# Patient Record
Sex: Female | Born: 1965 | Race: Black or African American | Hispanic: No | Marital: Married | Smoking: Never smoker
Health system: Southern US, Community
[De-identification: ages and names within clinical notes are randomized; demographics above are authoritative.]

## PROBLEM LIST (undated history)

## (undated) DIAGNOSIS — J45909 Unspecified asthma, uncomplicated: Secondary | ICD-10-CM

## (undated) DIAGNOSIS — F419 Anxiety disorder, unspecified: Secondary | ICD-10-CM

## (undated) DIAGNOSIS — I509 Heart failure, unspecified: Secondary | ICD-10-CM

## (undated) DIAGNOSIS — N83209 Unspecified ovarian cyst, unspecified side: Secondary | ICD-10-CM

## (undated) DIAGNOSIS — I10 Essential (primary) hypertension: Secondary | ICD-10-CM

## (undated) DIAGNOSIS — M797 Fibromyalgia: Secondary | ICD-10-CM

## (undated) DIAGNOSIS — G629 Polyneuropathy, unspecified: Secondary | ICD-10-CM

## (undated) DIAGNOSIS — M069 Rheumatoid arthritis, unspecified: Secondary | ICD-10-CM

## (undated) DIAGNOSIS — K760 Fatty (change of) liver, not elsewhere classified: Secondary | ICD-10-CM

## (undated) HISTORY — PX: TUBAL LIGATION: SHX77

---

## 2008-02-05 ENCOUNTER — Emergency Department (HOSPITAL_BASED_OUTPATIENT_CLINIC_OR_DEPARTMENT_OTHER): Admission: EM | Admit: 2008-02-05 | Discharge: 2008-02-05 | Payer: Self-pay | Admitting: Emergency Medicine

## 2008-02-10 ENCOUNTER — Emergency Department (HOSPITAL_BASED_OUTPATIENT_CLINIC_OR_DEPARTMENT_OTHER): Admission: EM | Admit: 2008-02-10 | Discharge: 2008-02-10 | Payer: Self-pay | Admitting: Emergency Medicine

## 2009-12-20 ENCOUNTER — Emergency Department (HOSPITAL_BASED_OUTPATIENT_CLINIC_OR_DEPARTMENT_OTHER): Admission: EM | Admit: 2009-12-20 | Discharge: 2009-12-20 | Payer: Self-pay | Admitting: Emergency Medicine

## 2009-12-23 ENCOUNTER — Emergency Department (HOSPITAL_BASED_OUTPATIENT_CLINIC_OR_DEPARTMENT_OTHER): Admission: EM | Admit: 2009-12-23 | Discharge: 2009-12-23 | Payer: Self-pay | Admitting: Emergency Medicine

## 2010-04-26 ENCOUNTER — Emergency Department (HOSPITAL_BASED_OUTPATIENT_CLINIC_OR_DEPARTMENT_OTHER)
Admission: EM | Admit: 2010-04-26 | Discharge: 2010-04-26 | Disposition: A | Discharge status: Home / Self Care | Payer: 59 | Attending: Emergency Medicine | Admitting: Emergency Medicine

## 2010-04-26 DIAGNOSIS — R112 Nausea with vomiting, unspecified: Secondary | ICD-10-CM | POA: Insufficient documentation

## 2010-04-26 DIAGNOSIS — R197 Diarrhea, unspecified: Secondary | ICD-10-CM | POA: Insufficient documentation

## 2010-04-26 LAB — URINALYSIS, ROUTINE W REFLEX MICROSCOPIC
Hgb urine dipstick: NEGATIVE
Nitrite: NEGATIVE
Protein, ur: 30 mg/dL — AB
Specific Gravity, Urine: 1.03 (ref 1.005–1.030)
Urobilinogen, UA: 1 mg/dL (ref 0.0–1.0)

## 2010-04-26 LAB — URINE MICROSCOPIC-ADD ON

## 2010-04-26 LAB — PREGNANCY, URINE: Preg Test, Ur: NEGATIVE

## 2010-12-21 ENCOUNTER — Ambulatory Visit (HOSPITAL_COMMUNITY): Payer: 59 | Admitting: Licensed Clinical Social Worker

## 2011-03-10 ENCOUNTER — Encounter (HOSPITAL_BASED_OUTPATIENT_CLINIC_OR_DEPARTMENT_OTHER): Payer: Self-pay | Admitting: *Deleted

## 2011-03-10 ENCOUNTER — Other Ambulatory Visit: Payer: Self-pay

## 2011-03-10 ENCOUNTER — Emergency Department (INDEPENDENT_AMBULATORY_CARE_PROVIDER_SITE_OTHER): Payer: 59

## 2011-03-10 ENCOUNTER — Encounter (HOSPITAL_BASED_OUTPATIENT_CLINIC_OR_DEPARTMENT_OTHER): Payer: Self-pay | Admitting: Emergency Medicine

## 2011-03-10 ENCOUNTER — Emergency Department (HOSPITAL_BASED_OUTPATIENT_CLINIC_OR_DEPARTMENT_OTHER)
Admission: EM | Admit: 2011-03-10 | Discharge: 2011-03-10 | Disposition: A | Discharge status: Home / Self Care | Payer: 59 | Attending: Emergency Medicine | Admitting: Emergency Medicine

## 2011-03-10 ENCOUNTER — Emergency Department (HOSPITAL_BASED_OUTPATIENT_CLINIC_OR_DEPARTMENT_OTHER)
Admission: EM | Admit: 2011-03-10 | Discharge: 2011-03-10 | Discharge status: Against Medical Advice | Payer: 59 | Attending: Emergency Medicine | Admitting: Emergency Medicine

## 2011-03-10 DIAGNOSIS — R0602 Shortness of breath: Secondary | ICD-10-CM

## 2011-03-10 DIAGNOSIS — F419 Anxiety disorder, unspecified: Secondary | ICD-10-CM

## 2011-03-10 DIAGNOSIS — F411 Generalized anxiety disorder: Secondary | ICD-10-CM | POA: Insufficient documentation

## 2011-03-10 DIAGNOSIS — Z79899 Other long term (current) drug therapy: Secondary | ICD-10-CM | POA: Insufficient documentation

## 2011-03-10 DIAGNOSIS — I10 Essential (primary) hypertension: Secondary | ICD-10-CM | POA: Insufficient documentation

## 2011-03-10 DIAGNOSIS — R109 Unspecified abdominal pain: Secondary | ICD-10-CM | POA: Insufficient documentation

## 2011-03-10 DIAGNOSIS — N390 Urinary tract infection, site not specified: Secondary | ICD-10-CM | POA: Insufficient documentation

## 2011-03-10 DIAGNOSIS — A599 Trichomoniasis, unspecified: Secondary | ICD-10-CM | POA: Insufficient documentation

## 2011-03-10 DIAGNOSIS — R079 Chest pain, unspecified: Secondary | ICD-10-CM | POA: Insufficient documentation

## 2011-03-10 HISTORY — DX: Essential (primary) hypertension: I10

## 2011-03-10 HISTORY — DX: Anxiety disorder, unspecified: F41.9

## 2011-03-10 HISTORY — DX: Unspecified ovarian cyst, unspecified side: N83.209

## 2011-03-10 LAB — URINALYSIS, ROUTINE W REFLEX MICROSCOPIC
Bilirubin Urine: NEGATIVE
Glucose, UA: NEGATIVE mg/dL
Glucose, UA: NEGATIVE mg/dL
Hgb urine dipstick: NEGATIVE
Ketones, ur: NEGATIVE mg/dL
Nitrite: NEGATIVE
Protein, ur: NEGATIVE mg/dL
Specific Gravity, Urine: 1.028 (ref 1.005–1.030)
Urobilinogen, UA: 1 mg/dL (ref 0.0–1.0)
pH: 6 (ref 5.0–8.0)

## 2011-03-10 LAB — CARDIAC PANEL(CRET KIN+CKTOT+MB+TROPI)
CK, MB: 1.2 ng/mL (ref 0.3–4.0)
Relative Index: INVALID (ref 0.0–2.5)
Relative Index: INVALID (ref 0.0–2.5)
Total CK: 47 U/L (ref 7–177)
Troponin I: <0.3 ng/mL (ref <0.30)

## 2011-03-10 LAB — CBC
HCT: 37.5 % (ref 36.0–46.0)
Hemoglobin: 13.2 g/dL (ref 12.0–15.0)
MCH: 30.6 pg (ref 26.0–34.0)
MCHC: 35.2 g/dL (ref 30.0–36.0)
RDW: 12.2 % (ref 11.5–15.5)

## 2011-03-10 LAB — URINE MICROSCOPIC-ADD ON

## 2011-03-10 LAB — URINE CULTURE

## 2011-03-10 MED ORDER — LORAZEPAM 2 MG/ML IJ SOLN
1.0000 mg | Freq: Once | INTRAMUSCULAR | Status: AC
  Administered 2011-03-10: 1 mg via INTRAVENOUS
  Filled 2011-03-10: qty 1

## 2011-03-10 MED ORDER — METRONIDAZOLE 500 MG PO TABS: 500.0000 mg | ORAL_TABLET | Freq: Two times a day (BID) | ORAL | Status: AC

## 2011-03-10 MED ORDER — CEPHALEXIN 500 MG PO CAPS: 500.0000 mg | ORAL_CAPSULE | Freq: Four times a day (QID) | ORAL | Status: AC

## 2011-03-10 NOTE — ED Provider Notes (Signed)
History     CSN: [id number]  Arrival date & time 03/10/11  4098   First MD Initiated Contact with Patient 03/10/11 1038      Chief Complaint  Patient presents with  . Shortness of Breath    (Consider location/radiation/quality/duration/timing/severity/associated sxs/prior treatment) HPI Patient presents this morning with complaint of pain in her right chest and feeling that she is unable to breathe. She states that she woke up this morning felt the pain in her chest and began to feel very jittery and anxious feeling as though she could not breathe. She used a neighbors albuterol inhaler which helped with her breathing but made her feel more anxious. She was here to be seen last night in the emergency department but left prior to being evaluated she states at that time she had one episode of vomiting and some right upper abdominal pain. She states the abdominal pain and vomiting completely resolved and a new problem today is chest pain anxiety and shortness of breath. She has no leg swelling. She's had no cough. She's had no fever or chills. Pain is not related to exertion there is no nausea there is no radiation. Pain is worse with palpation. There no other alleviating or modifying factors. There no other associated systemic symptoms.  Past Medical History  Diagnosis Date  . Hypertension   . Anxiety   . Ovarian cyst     Past Surgical History  Procedure Date  . Tubal ligation     History reviewed. No pertinent family history.  History  Substance Use Topics  . Smoking status: Never Smoker   . Smokeless tobacco: Not on file  . Alcohol Use: No    OB History    Grav Para Term Preterm Abortions TAB SAB Ect Mult Living                  Review of Systems ROS reviewed and otherwise negative except for mentioned in HPI  Allergies  Oxycodone base  Home Medications   Current Outpatient Rx  Name Route Sig Dispense Refill  . DIPHENHYDRAMINE HCL 25 MG PO CAPS Oral Take 25 mg  by mouth every 6 (six) hours as needed.    [name] LORATADINE 10 MG PO TABS Oral Take 10 mg by mouth daily.    . CEPHALEXIN 500 MG PO CAPS Oral Take 1 capsule (500 mg total) by mouth 4 (four) times daily. 20 capsule 0  . METRONIDAZOLE 500 MG PO TABS Oral Take 1 tablet (500 mg total) by mouth 2 (two) times daily. 14 tablet 0    BP 120/69  Pulse 62  Temp(Src) 98.9 F (37.2 C) (Oral)  Resp 14  Ht 5\' 6"  (1.676 m)  Wt 211 lb (95.709 kg)  BMI 34.06 kg/m2  SpO2 100%  LMP 02/20/2011 Vitals reviewed Physical Exam Physical Examination: General appearance - alert, concerned/anxious appearing, and in no acute distress Mental status - alert, oriented to person, place, and time Eyes - pupils equal and reactive, no conjunctival injection Mouth - mucous membranes moist, pharynx normal without lesions Chest - clear to auscultation, no wheezes, rales or rhonchi, symmetric air entry, tachypneic but no dyspnea or increased respiratory effort Heart - normal rate, regular rhythm, normal S1, S2, no murmurs, rubs, clicks or gallops Abdomen - soft, nontender, nondistended, no masses or organomegaly Neurological - alert, oriented, normal speech, no focal findings or movement disorder noted Musculoskeletal - no joint tenderness, deformity or swelling Extremities - peripheral pulses normal, no pedal edema, no clubbing  or cyanosis Skin - normal coloration and turgor, no rashes Psych-anxious, tearful  ED Course  Procedures (including critical care time)   Date: 03/10/2011  Rate: 64  Rhythm: normal sinus rhythm  QRS Axis: normal  Intervals: normal  ST/T Wave abnormalities: normal  Conduction Disutrbances:none  Narrative Interpretation:   Old EKG Reviewed: none available    Labs Reviewed  URINALYSIS, ROUTINE W REFLEX MICROSCOPIC - Abnormal; Notable for the following:    APPearance CLOUDY (*)    Leukocytes, UA SMALL (*)    All other components within normal limits  URINE MICROSCOPIC-ADD ON - Abnormal;  Notable for the following:    Squamous Epithelial / LPF MANY (*)    Bacteria, UA MANY (*)    All other components within normal limits  PREGNANCY, URINE  CBC  D-DIMER, QUANTITATIVE  CARDIAC PANEL(CRET KIN+CKTOT+MB+TROPI)  CARDIAC PANEL(CRET KIN+CKTOT+MB+TROPI)  URINE CULTURE   Dg Chest 2 View  03/10/2011  *RADIOLOGY REPORT*  Clinical Data: Chest pain; shortness of breath.  CHEST - 2 VIEW  Comparison: None.  Findings: Midline trachea.  Normal heart size and mediastinal contours. No pleural effusion or pneumothorax.  Low lung volumes. Clear lungs.  Numerous leads and wires project over the chest.  IMPRESSION: Low lung volumes without acute disease.  Original Report Authenticated By: Consuello Bossier, M.D.     1. Chest pain   2. Anxiety   3. Trichomonas   4. Urinary tract infection       MDM  Patient presenting with acute sharp chest pain as well as difficulty breathing with significant anxiety. Her chest x-ray and EKG are both reassuring. Her d-dimer was normal. She has had 2 sets of negative cardiac enzymes here in the emergency department. Her symptoms seemed very related to her anxiety and or resolved after being given Ativan. I have low suspicion for PE or acute coronary syndrome. Yesterday she had some abdominal discomfort and an episode of vomiting. Therefore urine was collected which is positive for UTI as well as trichomonas. Patient will be treated with antibiotics and Flagyl. She was discharged with strict return precautions and is agreeable with this plan.        Ethelda Chick, MD 03/10/11 1539

## 2011-03-10 NOTE — ED Notes (Signed)
This RT called to triage.  Pt in wheelchair.  Husband with pt stating that she couldn't breath since this am.  Pt slumped over in wheelchair.  Advised to sit up straight. Sat 99% on RA.  BBS clear. No stridor noted. Placed on continuous pulse in room.

## 2011-03-10 NOTE — ED Notes (Addendum)
Pt presents to ED today with RLQ abd pain that started around 6pm.  Pt tired no OTC meds pta

## 2011-03-10 NOTE — ED Notes (Addendum)
Pt states she woke up this am feeling like she was unable to breathe.  Pt states she borrowed her mothers inhaler and took 3 puffs, which did help her breathing some but started to have chest pain on the right side and shakiness.  Pt denies N/V/D.  No runny nose, cough or cold symptoms.  Denies fever or dysuria.  Pt is lying in bed, HR 66, Pox 100%.  Pt whispers that she cannot breathe.  Pt clutching chest.  No acute resp distress noted.  Pt also states she is having right lower abdominal pain since yesterday.  Was here but left prior to treatment.  Pt states she does have an ovarian cyst in that location.

## 2011-03-10 NOTE — ED Notes (Signed)
I gave the patient a sprite to drink to see how well she can tolerate it.

## 2011-08-08 ENCOUNTER — Encounter (HOSPITAL_BASED_OUTPATIENT_CLINIC_OR_DEPARTMENT_OTHER): Payer: Self-pay | Admitting: *Deleted

## 2011-08-08 ENCOUNTER — Emergency Department (HOSPITAL_BASED_OUTPATIENT_CLINIC_OR_DEPARTMENT_OTHER)
Admission: EM | Admit: 2011-08-08 | Discharge: 2011-08-08 | Disposition: A | Discharge status: Home / Self Care | Payer: 59 | Attending: Emergency Medicine | Admitting: Emergency Medicine

## 2011-08-08 ENCOUNTER — Emergency Department (HOSPITAL_BASED_OUTPATIENT_CLINIC_OR_DEPARTMENT_OTHER): Payer: 59

## 2011-08-08 DIAGNOSIS — K7689 Other specified diseases of liver: Secondary | ICD-10-CM | POA: Insufficient documentation

## 2011-08-08 DIAGNOSIS — R109 Unspecified abdominal pain: Secondary | ICD-10-CM

## 2011-08-08 DIAGNOSIS — R10811 Right upper quadrant abdominal tenderness: Secondary | ICD-10-CM | POA: Insufficient documentation

## 2011-08-08 LAB — DIFFERENTIAL
Basophils Absolute: 0 10*3/uL (ref 0.0–0.1)
Lymphocytes Relative: 52 % — ABNORMAL HIGH (ref 12–46)
Lymphs Abs: 2.6 10*3/uL (ref 0.7–4.0)
Neutro Abs: 1.8 10*3/uL (ref 1.7–7.7)

## 2011-08-08 LAB — URINALYSIS, ROUTINE W REFLEX MICROSCOPIC
Bilirubin Urine: NEGATIVE
Ketones, ur: NEGATIVE mg/dL
Nitrite: NEGATIVE
Protein, ur: NEGATIVE mg/dL
Urobilinogen, UA: 0.2 mg/dL (ref 0.0–1.0)
pH: 5.5 (ref 5.0–8.0)

## 2011-08-08 LAB — COMPREHENSIVE METABOLIC PANEL
AST: 14 U/L (ref 0–37)
Albumin: 3.7 g/dL (ref 3.5–5.2)
BUN: 11 mg/dL (ref 6–23)
Calcium: 9.2 mg/dL (ref 8.4–10.5)
Chloride: 102 mEq/L (ref 96–112)
Creatinine, Ser: 0.7 mg/dL (ref 0.50–1.10)
GFR calc non Af Amer: >90 mL/min (ref >90)
Total Bilirubin: 0.2 mg/dL — ABNORMAL LOW (ref 0.3–1.2)

## 2011-08-08 LAB — URINE MICROSCOPIC-ADD ON

## 2011-08-08 LAB — CBC
HCT: 36 % (ref 36.0–46.0)
MCV: 87.6 fL (ref 78.0–100.0)
Platelets: 239 10*3/uL (ref 150–400)
RBC: 4.11 MIL/uL (ref 3.87–5.11)
RDW: 12.2 % (ref 11.5–15.5)
WBC: 4.9 10*3/uL (ref 4.0–10.5)

## 2011-08-08 LAB — LIPASE, BLOOD: Lipase: 36 U/L (ref 11–59)

## 2011-08-08 MED ORDER — ONDANSETRON HCL 4 MG/2ML IJ SOLN
4.0000 mg | Freq: Once | INTRAMUSCULAR | Status: AC
  Administered 2011-08-08: 4 mg via INTRAVENOUS
  Filled 2011-08-08: qty 2

## 2011-08-08 MED ORDER — MORPHINE SULFATE 4 MG/ML IJ SOLN
4.0000 mg | Freq: Once | INTRAMUSCULAR | Status: AC
  Administered 2011-08-08: 4 mg via INTRAVENOUS
  Filled 2011-08-08: qty 1

## 2011-08-08 MED ORDER — HYDROCODONE-ACETAMINOPHEN 5-500 MG PO TABS: 1.0000 | ORAL_TABLET | Freq: Four times a day (QID) | ORAL | Status: AC | PRN

## 2011-08-08 NOTE — ED Provider Notes (Signed)
History     CSN: [id number]  Arrival date & time 08/08/11  1517   First MD Initiated Contact with Patient 08/08/11 1546      Chief Complaint  Patient presents with  . Abdominal Pain    (Consider location/radiation/quality/duration/timing/severity/associated sxs/prior treatment) HPI Comments: Pt c/o ruq pain  Patient is a 46 y.o. female presenting with abdominal pain. The history is provided by the patient. No language interpreter was used.  Abdominal Pain The primary symptoms of the illness include abdominal pain. The primary symptoms of the illness do not include fever, nausea, vomiting or diarrhea. The current episode started more than 2 days ago. The onset of the illness was gradual. The problem has not changed since onset. The patient states that she believes she is currently not pregnant. The patient has not had a change in bowel habit.    Past Medical History  Diagnosis Date  . Hypertension   . Anxiety   . Ovarian cyst     Past Surgical History  Procedure Date  . Tubal ligation     History reviewed. No pertinent family history.  History  Substance Use Topics  . Smoking status: Never Smoker   . Smokeless tobacco: Not on file  . Alcohol Use: No    OB History    Grav Para Term Preterm Abortions TAB SAB Ect Mult Living                  Review of Systems  Constitutional: Negative for fever.  Respiratory: Negative.   Cardiovascular: Negative.   Gastrointestinal: Positive for abdominal pain. Negative for nausea, vomiting and diarrhea.    Allergies  Oxycodone base  Home Medications   Current Outpatient Rx  Name Route Sig Dispense Refill  . DIPHENHYDRAMINE HCL 25 MG PO CAPS Oral Take 25 mg by mouth every 6 (six) hours as needed. Patient uses this medication for allergies.    [name] HYDROCORTISONE 1 % EX CREA Topical Apply 1 application topically 2 (two) times daily. Patient used this medication for hives.    [name] LORATADINE 10 MG PO TABS Oral Take 10 mg by mouth  daily. Patient uses this medication for allergies.      BP 111/75  Pulse 73  Temp 98.1 F (36.7 C) (Oral)  Resp 16  Ht 5\' 6"  (1.676 m)  Wt 211 lb (95.709 kg)  BMI 34.06 kg/m2  SpO2 100%  LMP 07/18/2011  Physical Exam  Nursing note and vitals reviewed. Constitutional: She is oriented to person, place, and time. She appears well-developed and well-nourished.  HENT:  Head: Normocephalic and atraumatic.  Eyes: Conjunctivae and EOM are normal.  Neck: Normal range of motion. Neck supple.  Cardiovascular: Normal rate and regular rhythm.   Pulmonary/Chest: Effort normal and breath sounds normal.  Abdominal: Soft. Bowel sounds are normal. There is tenderness in the right upper quadrant.  Musculoskeletal: Normal range of motion.  Neurological: She is alert and oriented to person, place, and time.  Skin: Skin is warm and dry.  Psychiatric: She has a normal mood and affect.    ED Course  Procedures (including critical care time)  Labs Reviewed  URINALYSIS, ROUTINE W REFLEX MICROSCOPIC - Abnormal; Notable for the following:    Hgb urine dipstick TRACE (*)     All other components within normal limits  DIFFERENTIAL - Abnormal; Notable for the following:    Neutrophils Relative 36 (*)     Lymphocytes Relative 52 (*)     All other components within  normal limits  URINE MICROSCOPIC-ADD ON  CBC  COMPREHENSIVE METABOLIC PANEL  LIPASE, BLOOD   US Abdomen Complete  08/08/2011  *RADIOLOGY REPORT*  Clinical Data:  Right upper quadrant pain.  COMPLETE ABDOMINAL ULTRASOUND  Comparison:  None.  Findings:  Gallbladder:  No gallstones, gallbladder wall thickening, or pericholecystic fluid.  Common bile duct:  Measures 0.2 cm.  Liver:  Demonstrates diffusely increased echogenicity.  No focal lesion or intrahepatic biliary ductal dilatation.  IVC:  Appears normal.  Pancreas:  No focal abnormality seen.  Spleen:  Measures 4.7 cm and appears normal.  Right Kidney:  Measures 9.4 cm and appears normal.   Left Kidney:  Measures 10.5 cm and appears normal.  Abdominal aorta:  No aneurysm identified.  IMPRESSION: Negative for gallstones or acute abnormality.  Fatty infiltration of the liver.  Original Report Authenticated By: Bernadene Bell. D'ALESSIO, M.D.     1. Abdominal pain       MDM  Pt is comfortable at this time:no sign of gallbladder problem noted:pt is not pregnant:abdomen not acute        Teressa Lower, NP 08/08/11 1812

## 2011-08-08 NOTE — ED Notes (Signed)
Pt c/o right abd pain x 1.5 weeks.  Pt denies n/v.

## 2011-08-09 NOTE — ED Provider Notes (Signed)
Medical screening examination/treatment/procedure(s) were performed by non-physician practitioner and as supervising physician I was immediately available for consultation/collaboration.  Geoffery Lyons, MD 08/09/11 2221

## 2012-12-22 ENCOUNTER — Encounter (HOSPITAL_BASED_OUTPATIENT_CLINIC_OR_DEPARTMENT_OTHER): Payer: Self-pay | Admitting: Emergency Medicine

## 2012-12-22 ENCOUNTER — Emergency Department (HOSPITAL_BASED_OUTPATIENT_CLINIC_OR_DEPARTMENT_OTHER)
Admission: EM | Admit: 2012-12-22 | Discharge: 2012-12-23 | Disposition: A | Discharge status: Home / Self Care | Payer: 59 | Attending: Emergency Medicine | Admitting: Emergency Medicine

## 2012-12-22 ENCOUNTER — Emergency Department (HOSPITAL_BASED_OUTPATIENT_CLINIC_OR_DEPARTMENT_OTHER): Payer: 59

## 2012-12-22 DIAGNOSIS — Z8742 Personal history of other diseases of the female genital tract: Secondary | ICD-10-CM | POA: Insufficient documentation

## 2012-12-22 DIAGNOSIS — R0789 Other chest pain: Secondary | ICD-10-CM | POA: Insufficient documentation

## 2012-12-22 DIAGNOSIS — R209 Unspecified disturbances of skin sensation: Secondary | ICD-10-CM | POA: Insufficient documentation

## 2012-12-22 DIAGNOSIS — F419 Anxiety disorder, unspecified: Secondary | ICD-10-CM

## 2012-12-22 DIAGNOSIS — R079 Chest pain, unspecified: Secondary | ICD-10-CM

## 2012-12-22 DIAGNOSIS — Z79899 Other long term (current) drug therapy: Secondary | ICD-10-CM | POA: Insufficient documentation

## 2012-12-22 DIAGNOSIS — F411 Generalized anxiety disorder: Secondary | ICD-10-CM | POA: Insufficient documentation

## 2012-12-22 DIAGNOSIS — I10 Essential (primary) hypertension: Secondary | ICD-10-CM | POA: Insufficient documentation

## 2012-12-22 LAB — COMPREHENSIVE METABOLIC PANEL
ALT: 8 U/L (ref 0–35)
AST: 13 U/L (ref 0–37)
Alkaline Phosphatase: 75 U/L (ref 39–117)
CO2: 24 mEq/L (ref 19–32)
Chloride: 102 mEq/L (ref 96–112)
GFR calc Af Amer: 88 mL/min — ABNORMAL LOW (ref >90)
GFR calc non Af Amer: 76 mL/min — ABNORMAL LOW (ref >90)
Glucose, Bld: 86 mg/dL (ref 70–99)
Sodium: 136 mEq/L (ref 135–145)
Total Bilirubin: 0.2 mg/dL — ABNORMAL LOW (ref 0.3–1.2)

## 2012-12-22 LAB — CBC WITH DIFFERENTIAL/PLATELET
Basophils Absolute: 0 10*3/uL (ref 0.0–0.1)
Basophils Relative: 0 % (ref 0–1)
Eosinophils Absolute: 0 10*3/uL (ref 0.0–0.7)
MCH: 30 pg (ref 26.0–34.0)
MCHC: 33.8 g/dL (ref 30.0–36.0)
Monocytes Relative: 11 % (ref 3–12)
Neutrophils Relative %: 41 % — ABNORMAL LOW (ref 43–77)
Platelets: 242 10*3/uL (ref 150–400)
RDW: 12.5 % (ref 11.5–15.5)

## 2012-12-22 MED ORDER — SODIUM CHLORIDE 0.9 % IV SOLN
INTRAVENOUS | Status: DC
  Administered 2012-12-22: 21:00:00 via INTRAVENOUS

## 2012-12-22 MED ORDER — IBUPROFEN 800 MG PO TABS
800.0000 mg | ORAL_TABLET | Freq: Once | ORAL | Status: AC
  Administered 2012-12-22: 800 mg via ORAL
  Filled 2012-12-22: qty 1

## 2012-12-22 MED ORDER — LORAZEPAM 1 MG PO TABS
1.0000 mg | ORAL_TABLET | Freq: Once | ORAL | Status: AC
  Administered 2012-12-22: 1 mg via ORAL
  Filled 2012-12-22: qty 1

## 2012-12-22 MED ORDER — LORAZEPAM 1 MG PO TABS: 1.0000 mg | ORAL_TABLET | Freq: Three times a day (TID) | ORAL | Status: DC | PRN

## 2012-12-22 NOTE — ED Provider Notes (Signed)
CSN: [id number]     Arrival date & time 12/22/12  2031 History   First MD Initiated Contact with Patient 12/22/12 2145     This chart was scribed for Melinda Chick, MD by Arlan Organ, ED Scribe. This patient was seen in room MH04/MH04 and the patient's care was started 10:40 PM.   Chief Complaint  Patient presents with  . Chest Pain   Patient is a 47 y.o. female presenting with chest pain. The history is provided by the patient. No language interpreter was used.  Chest Pain Pain radiates to:  R arm Pain radiates to the back: no   Pain severity:  Mild Onset quality:  Gradual Duration:  12 hours Timing:  Constant Progression:  Worsening Chronicity:  Recurrent Context: breathing   Relieved by:  Nothing Worsened by:  Deep breathing Ineffective treatments:  None tried  HPI Comments: Melinda Woods is a 47 y.o. female with a hx of HTN who presents to the Emergency Department complaining of CP that started this morning. She also reports some anxiety since being in the ED. Pt describes the feeling as "cramping", and states breathing worsens the pain. She says the pain radiates to her right arm. She states she drank some hot water to help the discomfort earlier with no relief.  She also experienced numbness and tingling in bilateral hands.  Pt denies hx of blood clots. Pt denies being on any HTN medication. Pt denies any recent long distance travel. No OCPs or hormone replacements.  There are no other associated systemic symptoms, there are no other alleviating or modifying factors.    PCP- Dr. Meriam Sprague  Past Medical History  Diagnosis Date  . Hypertension   . Anxiety   . Ovarian cyst    Past Surgical History  Procedure Laterality Date  . Tubal ligation     No family history on file. History  Substance Use Topics  . Smoking status: Never Smoker   . Smokeless tobacco: Not on file  . Alcohol Use: No   OB History   Grav Para Term Preterm Abortions TAB SAB Ect Mult Living                  Review of Systems  Cardiovascular: Positive for chest pain.  All other systems reviewed and are negative.    Allergies  Oxycodone base  Home Medications   Current Outpatient Rx  Name  Route  Sig  Dispense  Refill  . diphenhydrAMINE (BENADRYL) 25 mg capsule   Oral   Take 25 mg by mouth every 6 (six) hours as needed. Patient uses this medication for allergies.         . hydrocortisone cream 1 %   Topical   Apply 1 application topically 2 (two) times daily. Patient used this medication for hives.         [name] loratadine (CLARITIN) 10 MG tablet   Oral   Take 10 mg by mouth daily. Patient uses this medication for allergies.         [name] LORazepam (ATIVAN) 1 MG tablet   Oral   Take 1 tablet (1 mg total) by mouth 3 (three) times daily as needed for anxiety.   6 tablet   0    BP 98/73  Pulse 76  Temp(Src) 98.5 F (36.9 C) (Oral)  Resp 20  Wt 225 lb (102.059 kg)  BMI 36.33 kg/m2  SpO2 100% Vitals reviewed Physical Exam  Nursing note and vitals reviewed. Constitutional: She is  oriented to person, place, and time. She appears well-developed and well-nourished.  HENT:  Head: Normocephalic and atraumatic.  Eyes: EOM are normal.  Neck: Normal range of motion.  Cardiovascular: Normal rate, regular rhythm and normal heart sounds.   Pulmonary/Chest: Effort normal and breath sounds normal.  Initially hyperventilation but calmed during exam   Abdominal: Soft. Bowel sounds are normal. She exhibits no distension. There is no tenderness.  Musculoskeletal: Normal range of motion.  No edema in lower extremities  Neurological: She is alert and oriented to person, place, and time.  Skin: Skin is warm and dry.  Psychiatric: She has a normal mood and affect. Her behavior is normal.    ED Course  Procedures (including critical care time)  DIAGNOSTIC STUDIES: Oxygen Saturation is 100% on RA, Normal by my interpretation.    COORDINATION OF CARE: 10:44 PM- Will give pain  medication. Will order CXR and blood work. Discussed treatment plan with pt at bedside and pt agreed to plan.     Labs Review Labs Reviewed  COMPREHENSIVE METABOLIC PANEL - Abnormal; Notable for the following:    Total Bilirubin 0.2 (*)    GFR calc non Af Amer 76 (*)    GFR calc Af Amer 88 (*)    All other components within normal limits  CBC WITH DIFFERENTIAL - Abnormal; Notable for the following:    Neutrophils Relative % 41 (*)    Lymphocytes Relative 48 (*)    All other components within normal limits  TROPONIN I   Imaging Review Dg Chest 2 View  12/22/2012   CLINICAL DATA:  Chest pain, lightheadedness.  EXAM: CHEST  2 VIEW  COMPARISON:  Chest radiograph March 10, 2011  FINDINGS: Cardiomediastinal silhouette is unremarkable. The lungs are clear without pleural effusions or focal consolidations. Pulmonary vasculature is unremarkable. Trachea projects midline and there is no pneumothorax. Soft tissue planes and included osseous structures are nonsuspicious.  IMPRESSION: No acute cardiopulmonary process; normal chest radiograph, stable in appearance from March 10, 2011   Electronically Signed   By: Awilda Metro   On: 12/22/2012 22:19    EKG Interpretation     Ventricular Rate:  77 PR Interval:  150 QRS Duration: 82 QT Interval:  380 QTC Calculation: 430 R Axis:   53 Text Interpretation:  Normal sinus rhythm Normal ECG No significant change since last tracing  of 1/13            MDM   1. Chest pain   2. Anxiety    Pt presenting with c/o chest pain.  She is also very anxious and was hyperventilating on arrival to the ED.  She has had constant pain for over 6 hours.  EKG reassuring as well as troponin which rules her out for MI in this situation due to duration of discomfort.  She is PERC 0.  I feel that a significant component of her symptoms are due to anxiety.  Low suspicion for ACS or other acute emergent condition at this time.  CXR reassuring.  Images  reviewed and interpreted by me as well.  Discharged with strict return precautions.  Pt agreeable with plan.  I personally performed the services described in this documentation, which was scribed in my presence. The recorded information has been reviewed and is accurate.   Melinda Chick, MD 12/23/12 1515

## 2012-12-22 NOTE — ED Notes (Signed)
Pt resp rate increased and was coached for hyperventilation and condition improved once resp rater decreased

## 2012-12-22 NOTE — ED Notes (Signed)
Chest pain since this am. She drank hot water to relieve the pain. Pain radiates into her right arm.

## 2013-04-30 ENCOUNTER — Emergency Department (HOSPITAL_BASED_OUTPATIENT_CLINIC_OR_DEPARTMENT_OTHER)
Admission: EM | Admit: 2013-04-30 | Discharge: 2013-04-30 | Disposition: A | Discharge status: Home / Self Care | Payer: 59 | Attending: Emergency Medicine | Admitting: Emergency Medicine

## 2013-04-30 ENCOUNTER — Encounter (HOSPITAL_BASED_OUTPATIENT_CLINIC_OR_DEPARTMENT_OTHER): Payer: Self-pay | Admitting: Emergency Medicine

## 2013-04-30 DIAGNOSIS — I10 Essential (primary) hypertension: Secondary | ICD-10-CM | POA: Insufficient documentation

## 2013-04-30 DIAGNOSIS — R269 Unspecified abnormalities of gait and mobility: Secondary | ICD-10-CM | POA: Insufficient documentation

## 2013-04-30 DIAGNOSIS — Z79899 Other long term (current) drug therapy: Secondary | ICD-10-CM | POA: Insufficient documentation

## 2013-04-30 DIAGNOSIS — F411 Generalized anxiety disorder: Secondary | ICD-10-CM | POA: Insufficient documentation

## 2013-04-30 DIAGNOSIS — M545 Low back pain, unspecified: Secondary | ICD-10-CM | POA: Insufficient documentation

## 2013-04-30 DIAGNOSIS — M549 Dorsalgia, unspecified: Secondary | ICD-10-CM

## 2013-04-30 DIAGNOSIS — Z8742 Personal history of other diseases of the female genital tract: Secondary | ICD-10-CM | POA: Insufficient documentation

## 2013-04-30 DIAGNOSIS — Z3202 Encounter for pregnancy test, result negative: Secondary | ICD-10-CM | POA: Insufficient documentation

## 2013-04-30 DIAGNOSIS — IMO0002 Reserved for concepts with insufficient information to code with codable children: Secondary | ICD-10-CM | POA: Insufficient documentation

## 2013-04-30 LAB — URINALYSIS, ROUTINE W REFLEX MICROSCOPIC
BILIRUBIN URINE: NEGATIVE
Glucose, UA: NEGATIVE mg/dL
Ketones, ur: NEGATIVE mg/dL
Leukocytes, UA: NEGATIVE
NITRITE: NEGATIVE
PH: 6 (ref 5.0–8.0)
Protein, ur: NEGATIVE mg/dL
SPECIFIC GRAVITY, URINE: 1.021 (ref 1.005–1.030)
Urobilinogen, UA: 1 mg/dL (ref 0.0–1.0)

## 2013-04-30 LAB — URINE MICROSCOPIC-ADD ON

## 2013-04-30 LAB — PREGNANCY, URINE: Preg Test, Ur: NEGATIVE

## 2013-04-30 MED ORDER — DIAZEPAM 5 MG PO TABS: 5.0000 mg | ORAL_TABLET | Freq: Two times a day (BID) | ORAL | Status: DC

## 2013-04-30 MED ORDER — DEXAMETHASONE 4 MG PO TABS
6.0000 mg | ORAL_TABLET | Freq: Once | ORAL | Status: AC
  Administered 2013-04-30: 6 mg via ORAL

## 2013-04-30 MED ORDER — TRAMADOL HCL 50 MG PO TABS: 50.0000 mg | ORAL_TABLET | Freq: Four times a day (QID) | ORAL | Status: DC | PRN

## 2013-04-30 MED ORDER — DEXAMETHASONE 4 MG PO TABS
ORAL_TABLET | ORAL | Status: AC
  Administered 2013-04-30: 6 mg via ORAL
  Filled 2013-04-30: qty 2

## 2013-04-30 MED ORDER — DIAZEPAM 5 MG PO TABS
5.0000 mg | ORAL_TABLET | Freq: Once | ORAL | Status: AC
  Administered 2013-04-30: 5 mg via ORAL
  Filled 2013-04-30: qty 1

## 2013-04-30 MED ORDER — IBUPROFEN 800 MG PO TABS: 800.0000 mg | ORAL_TABLET | Freq: Three times a day (TID) | ORAL | Status: AC

## 2013-04-30 MED ORDER — FENTANYL CITRATE 0.05 MG/ML IJ SOLN
50.0000 ug | Freq: Once | INTRAMUSCULAR | Status: AC
  Administered 2013-04-30: 50 ug via INTRAMUSCULAR
  Filled 2013-04-30: qty 2

## 2013-04-30 MED ORDER — IBUPROFEN 800 MG PO TABS
800.0000 mg | ORAL_TABLET | Freq: Once | ORAL | Status: AC
  Administered 2013-04-30: 800 mg via ORAL
  Filled 2013-04-30: qty 1

## 2013-04-30 NOTE — ED Provider Notes (Signed)
CSN: [id number]     Arrival date & time 04/30/13  0709 History   First MD Initiated Contact with Patient 04/30/13 0710     Chief Complaint  Patient presents with  . Back Pain      HPI  Patient presents with back pain.  Symptoms began yesterday without clear precipitant.  Patient notes that she was sitting, when she began to feel throbbing, severe pain in the right lower back.  Subsequently the pain radiated towards the posterior of the left leg.  Pain is worse with ambulation and weightbearing.  There is no associated abdominal pain, dysuria, hematuria, chest pain, dyspnea, fever, chills. No attempts at relief with anything as far. Patient has a history of prior disc disease, successfully treated with physical therapy.   Past Medical History  Diagnosis Date  . Hypertension   . Anxiety   . Ovarian cyst    Past Surgical History  Procedure Laterality Date  . Tubal ligation     No family history on file. History  Substance Use Topics  . Smoking status: Never Smoker   . Smokeless tobacco: Not on file  . Alcohol Use: No   OB History   Grav Para Term Preterm Abortions TAB SAB Ect Mult Living                 Review of Systems  Constitutional:       Per HPI, otherwise negative  HENT:       Per HPI, otherwise negative  Respiratory:       Per HPI, otherwise negative  Cardiovascular:       Per HPI, otherwise negative  Gastrointestinal: Negative for vomiting.  Endocrine:       Negative aside from HPI  Genitourinary:       Neg aside from HPI   Musculoskeletal:       Per HPI, otherwise negative  Skin: Negative.   Neurological: Negative for syncope.      Allergies  Oxycodone base  Home Medications   Current Outpatient Rx  Name  Route  Sig  Dispense  Refill  . diphenhydrAMINE (BENADRYL) 25 mg capsule   Oral   Take 25 mg by mouth every 6 (six) hours as needed. Patient uses this medication for allergies.         . hydrocortisone cream 1 %   Topical   Apply 1  application topically 2 (two) times daily. Patient used this medication for hives.         [name] loratadine (CLARITIN) 10 MG tablet   Oral   Take 10 mg by mouth daily. Patient uses this medication for allergies.         [name] LORazepam (ATIVAN) 1 MG tablet   Oral   Take 1 tablet (1 mg total) by mouth 3 (three) times daily as needed for anxiety.   6 tablet   0   . mirtazapine (REMERON) 7.5 MG tablet   Oral   Take 7.5 mg by mouth at bedtime.          BP 124/85  Temp(Src) 98.6 F (37 C) (Oral)  Resp 100  Ht 5\' 6"  (1.676 m)  Wt 227 lb (102.967 kg)  BMI 36.66 kg/m2  SpO2 100%  LMP 04/21/2013 Physical Exam  Nursing note and vitals reviewed. Constitutional: She is oriented to person, place, and time. She appears well-developed and well-nourished. No distress.  HENT:  Head: Normocephalic and atraumatic.  Eyes: Conjunctivae and EOM are normal.  Cardiovascular: Normal rate  and regular rhythm.   Pulmonary/Chest: Effort normal and breath sounds normal. No stridor. No respiratory distress.  Abdominal: She exhibits no distension.  Musculoskeletal: She exhibits no edema.  The pelvis is stable, patient can flex and extend each hip, has antalgic gait secondary to pain in the back. There is tenderness to palpation about the right posterior lateral lower spine, no gross deformity.   Neurological: She is alert and oriented to person, place, and time. No cranial nerve deficit. She exhibits normal muscle tone. Gait abnormal. Coordination normal.  Skin: Skin is warm and dry.  Psychiatric: She has a normal mood and affect.    ED Course  Procedures (including critical care time) Labs Review Labs Reviewed  URINALYSIS, ROUTINE W REFLEX MICROSCOPIC - Abnormal; Notable for the following:    Hgb urine dipstick TRACE (*)    All other components within normal limits  URINE MICROSCOPIC-ADD ON - Abnormal; Notable for the following:    Squamous Epithelial / LPF FEW (*)    Bacteria, UA MANY (*)     All other components within normal limits  PREGNANCY, URINE   8:32 AM Patient improved - no new complaints.   MDM   This patient presents with one day of low back pain, radiating down the posterior of the right leg.  On exam she is awake, alert, neurologically intact, though she is in no discomfort.  Patient's evaluation is most consistent with diagnosis of radiculopathy.  Absent incontinence, fever, weakness, there is low suspicion for occult CNS disease or infection.  Patient was discharged in stable condition after initiation of medication with primary care followup.  Gerhard Munch, MD 04/30/13 [phone]

## 2013-04-30 NOTE — Discharge Instructions (Signed)
As discussed, your evaluation today has been largely reassuring.  But, it is important that you monitor your condition carefully, and do not hesitate to return to the ED if you develop new, or concerning changes in your condition.  Otherwise, please follow-up with your physician for appropriate ongoing care.  Please be sure to discuss physical therapy, as well as appropriate medications for your pain.

## 2013-04-30 NOTE — ED Notes (Signed)
Patient states she was sitting at a desk working when she right lower back area yesterday.  States she thought it was a kidney infection.  States she started drinking water and cranberry juice thinking it would help.  States she woke up this morning and the pain radiating into right buttocks and down the lateral leg.  Past hx of bulging disk L 3 - L 5  and was treated with PT and stretching.

## 2014-03-13 ENCOUNTER — Encounter (HOSPITAL_BASED_OUTPATIENT_CLINIC_OR_DEPARTMENT_OTHER): Payer: Self-pay | Admitting: *Deleted

## 2014-03-13 ENCOUNTER — Emergency Department (HOSPITAL_BASED_OUTPATIENT_CLINIC_OR_DEPARTMENT_OTHER): Payer: 59

## 2014-03-13 ENCOUNTER — Observation Stay (HOSPITAL_BASED_OUTPATIENT_CLINIC_OR_DEPARTMENT_OTHER)
Admission: EM | Admit: 2014-03-13 | Discharge: 2014-03-14 | Disposition: A | Discharge status: Home / Self Care | Payer: 59 | Attending: Internal Medicine | Admitting: Internal Medicine

## 2014-03-13 ENCOUNTER — Observation Stay (HOSPITAL_COMMUNITY): Payer: 59

## 2014-03-13 DIAGNOSIS — E785 Hyperlipidemia, unspecified: Secondary | ICD-10-CM | POA: Diagnosis not present

## 2014-03-13 DIAGNOSIS — F419 Anxiety disorder, unspecified: Secondary | ICD-10-CM | POA: Diagnosis not present

## 2014-03-13 DIAGNOSIS — I10 Essential (primary) hypertension: Secondary | ICD-10-CM | POA: Diagnosis not present

## 2014-03-13 DIAGNOSIS — R531 Weakness: Secondary | ICD-10-CM

## 2014-03-13 DIAGNOSIS — G459 Transient cerebral ischemic attack, unspecified: Principal | ICD-10-CM | POA: Diagnosis present

## 2014-03-13 DIAGNOSIS — Z885 Allergy status to narcotic agent status: Secondary | ICD-10-CM | POA: Insufficient documentation

## 2014-03-13 DIAGNOSIS — M6289 Other specified disorders of muscle: Secondary | ICD-10-CM

## 2014-03-13 LAB — URINALYSIS, ROUTINE W REFLEX MICROSCOPIC
GLUCOSE, UA: NEGATIVE mg/dL
Hgb urine dipstick: NEGATIVE
KETONES UR: NEGATIVE mg/dL
LEUKOCYTES UA: NEGATIVE
Nitrite: NEGATIVE
Protein, ur: NEGATIVE mg/dL
Specific Gravity, Urine: 1.012 (ref 1.005–1.030)
Urobilinogen, UA: 1 mg/dL (ref 0.0–1.0)
pH: 6 (ref 5.0–8.0)

## 2014-03-13 LAB — CBC
HCT: 36.1 % (ref 36.0–46.0)
HEMATOCRIT: 39.5 % (ref 36.0–46.0)
Hemoglobin: 13.1 g/dL (ref 12.0–15.0)
Hemoglobin: 12.1 g/dL (ref 12.0–15.0)
MCH: 30 pg (ref 26.0–34.0)
MCH: 30 pg (ref 26.0–34.0)
MCHC: 33.2 g/dL (ref 30.0–36.0)
MCHC: 33.5 g/dL (ref 30.0–36.0)
MCV: 90.4 fL (ref 78.0–100.0)
MCV: 89.6 fL (ref 78.0–100.0)
PLATELETS: 239 10*3/uL (ref 150–400)
Platelets: 219 10*3/uL (ref 150–400)
RBC: 4.37 MIL/uL (ref 3.87–5.11)
RBC: 4.03 MIL/uL (ref 3.87–5.11)
RDW: 13.3 % (ref 11.5–15.5)
RDW: 13.4 % (ref 11.5–15.5)
WBC: 4.8 10*3/uL (ref 4.0–10.5)
WBC: 4.7 10*3/uL (ref 4.0–10.5)

## 2014-03-13 LAB — COMPREHENSIVE METABOLIC PANEL
ALT: 18 U/L (ref 0–35)
AST: 23 U/L (ref 0–37)
Albumin: 4 g/dL (ref 3.5–5.2)
Alkaline Phosphatase: 75 U/L (ref 39–117)
Anion gap: 8 (ref 5–15)
BUN: 11 mg/dL (ref 6–23)
CO2: 23 mmol/L (ref 19–32)
Calcium: 9 mg/dL (ref 8.4–10.5)
Chloride: 105 mEq/L (ref 96–112)
Creatinine, Ser: 0.81 mg/dL (ref 0.50–1.10)
GFR calc Af Amer: >90 mL/min (ref >90)
GFR, EST NON AFRICAN AMERICAN: 85 mL/min — AB (ref >90)
GLUCOSE: 101 mg/dL — AB (ref 70–99)
Potassium: 4.1 mmol/L (ref 3.5–5.1)
SODIUM: 136 mmol/L (ref 135–145)
Total Bilirubin: 1 mg/dL (ref 0.3–1.2)
Total Protein: 8.4 g/dL — ABNORMAL HIGH (ref 6.0–8.3)

## 2014-03-13 LAB — GLUCOSE, CAPILLARY: GLUCOSE-CAPILLARY: 123 mg/dL — AB (ref 70–99)

## 2014-03-13 LAB — DIFFERENTIAL
BASOS PCT: 0 % (ref 0–1)
Basophils Absolute: 0 10*3/uL (ref 0.0–0.1)
Eosinophils Absolute: 0.1 10*3/uL (ref 0.0–0.7)
Eosinophils Relative: 2 % (ref 0–5)
LYMPHS ABS: 2.1 10*3/uL (ref 0.7–4.0)
Lymphocytes Relative: 45 % (ref 12–46)
MONO ABS: 0.6 10*3/uL (ref 0.1–1.0)
MONOS PCT: 12 % (ref 3–12)
NEUTROS PCT: 41 % — AB (ref 43–77)
Neutro Abs: 2 10*3/uL (ref 1.7–7.7)

## 2014-03-13 LAB — CREATININE, SERUM
Creatinine, Ser: 0.77 mg/dL (ref 0.50–1.10)
GFR calc Af Amer: >90 mL/min (ref >90)

## 2014-03-13 LAB — APTT: aPTT: 30 seconds (ref 24–37)

## 2014-03-13 LAB — PROTIME-INR
INR: 0.9 (ref 0.00–1.49)
Prothrombin Time: 12.2 seconds (ref 11.6–15.2)

## 2014-03-13 LAB — HEMOGLOBIN A1C
Hgb A1c MFr Bld: 5.5 % (ref <5.7)
MEAN PLASMA GLUCOSE: 111 mg/dL (ref <117)

## 2014-03-13 LAB — TROPONIN I

## 2014-03-13 LAB — TSH: TSH: 3.304 u[IU]/mL (ref 0.350–4.500)

## 2014-03-13 MED ORDER — ACETAMINOPHEN 325 MG PO TABS: 650.0000 mg | ORAL_TABLET | ORAL | Status: DC | PRN

## 2014-03-13 MED ORDER — ENOXAPARIN SODIUM 40 MG/0.4ML ~~LOC~~ SOLN
40.0000 mg | SUBCUTANEOUS | Status: DC
  Administered 2014-03-13: 40 mg via SUBCUTANEOUS
  Filled 2014-03-13: qty 0.4

## 2014-03-13 MED ORDER — LORAZEPAM 1 MG PO TABS
1.0000 mg | ORAL_TABLET | Freq: Three times a day (TID) | ORAL | Status: DC | PRN
  Administered 2014-03-13 – 2014-03-14 (×2): 1 mg via ORAL
  Filled 2014-03-13 (×2): qty 1

## 2014-03-13 MED ORDER — MIRTAZAPINE 7.5 MG PO TABS
7.5000 mg | ORAL_TABLET | Freq: Every day | ORAL | Status: DC
  Administered 2014-03-13: 7.5 mg via ORAL
  Filled 2014-03-13 (×2): qty 1

## 2014-03-13 MED ORDER — ASPIRIN 81 MG PO CHEW
324.0000 mg | CHEWABLE_TABLET | Freq: Once | ORAL | Status: AC
Start: 2014-03-13 — End: 2014-03-13
  Administered 2014-03-13: 324 mg via ORAL
  Filled 2014-03-13: qty 4

## 2014-03-13 MED ORDER — GADOBENATE DIMEGLUMINE 529 MG/ML IV SOLN
20.0000 mL | Freq: Once | INTRAVENOUS | Status: AC | PRN
  Administered 2014-03-13: 20 mL via INTRAVENOUS

## 2014-03-13 MED ORDER — STROKE: EARLY STAGES OF RECOVERY BOOK
Freq: Once | Status: AC
  Administered 2014-03-13: 19:00:00

## 2014-03-13 MED ORDER — ASPIRIN 325 MG PO TABS
325.0000 mg | ORAL_TABLET | Freq: Every day | ORAL | Status: DC
  Administered 2014-03-14: 325 mg via ORAL
  Filled 2014-03-13: qty 1

## 2014-03-13 MED ORDER — TRAMADOL HCL 50 MG PO TABS
50.0000 mg | ORAL_TABLET | Freq: Four times a day (QID) | ORAL | Status: DC | PRN
  Administered 2014-03-13 – 2014-03-14 (×3): 50 mg via ORAL
  Filled 2014-03-13 (×3): qty 1

## 2014-03-13 NOTE — ED Notes (Signed)
Patient has been seeing Dr. Samuel Bouche for her legs and feet. States that she feels like she walks on pins, but this morning she was taking a shower and her feet "gave out"

## 2014-03-13 NOTE — ED Provider Notes (Signed)
TIME SEEN: 10:24 AM  CHIEF COMPLAINT: Right-sided weakness  HPI: Pt is a 49 y.o. female with history of hypertension, hyperlipidemia, anxiety who presents emergency department with complaints of right-sided weakness that started this morning at 9 AM. She states the past 7-8 months she has had numbness and tingling and feels like she is "walking on pins" in bilateral feet. This morning while taking a shower and 9 AM she felt her right side was much weaker in her right leg gave out on her. She did not fall and hit her head. She states that the right-sided weakness is slowly improving but not completely resolved. No new numbness. No facial droop or slurred speech. No prior history of stroke. Not on anticoagulation. Patient has chronic lower back pain which is unchanged. No neck pain. No headache. No bowel or bladder incontinence.  ROS: See HPI Constitutional: no fever  Eyes: no drainage  ENT: no runny nose   Cardiovascular:  no chest pain  Resp: no SOB  GI: no vomiting GU: no dysuria Integumentary: no rash  Allergy: no hives  Musculoskeletal: no leg swelling  Neurological: no slurred speech ROS otherwise negative  PAST MEDICAL HISTORY/PAST SURGICAL HISTORY:  Past Medical History  Diagnosis Date  . Hypertension   . Anxiety   . Ovarian cyst     MEDICATIONS:  Prior to Admission medications   Medication Sig Start Date End Date Taking? Authorizing Provider  etodolac (LODINE) 200 MG capsule Take 200 mg by mouth every 8 (eight) hours.   Yes Historical Provider, MD  diazepam (VALIUM) 5 MG tablet Take 1 tablet (5 mg total) by mouth 2 (two) times daily. 04/30/13   Gerhard Munch, MD  diphenhydrAMINE (BENADRYL) 25 mg capsule Take 25 mg by mouth every 6 (six) hours as needed. Patient uses this medication for allergies.    Historical Provider, MD  hydrocortisone cream 1 % Apply 1 application topically 2 (two) times daily. Patient used this medication for hives.    Historical Provider, MD   loratadine (CLARITIN) 10 MG tablet Take 10 mg by mouth daily. Patient uses this medication for allergies.    Historical Provider, MD  LORazepam (ATIVAN) 1 MG tablet Take 1 tablet (1 mg total) by mouth 3 (three) times daily as needed for anxiety. 12/22/12   Ethelda Chick, MD  mirtazapine (REMERON) 7.5 MG tablet Take 7.5 mg by mouth at bedtime.    Historical Provider, MD  traMADol (ULTRAM) 50 MG tablet Take 1 tablet (50 mg total) by mouth every 6 (six) hours as needed. 04/30/13   Gerhard Munch, MD    ALLERGIES:  Allergies  Allergen Reactions  . Oxycodone Base Hives    SOCIAL HISTORY:  History  Substance Use Topics  . Smoking status: Never Smoker   . Smokeless tobacco: Not on file  . Alcohol Use: No    FAMILY HISTORY: No family history on file.  EXAM: BP 131/67 mmHg  Pulse 91  Temp(Src) 98.6 F (37 C) (Oral)  Resp 18  SpO2 98% CONSTITUTIONAL: Alert and oriented and responds appropriately to questions. Well-appearing; well-nourished HEAD: Normocephalic EYES: Conjunctivae clear, PERRL ENT: normal nose; no rhinorrhea; moist mucous membranes; pharynx without lesions noted NECK: Supple, no meningismus, no LAD; no midline spinal tenderness or step-off or deformity CARD: RRR; S1 and S2 appreciated; no murmurs, no clicks, no rubs, no gallops RESP: Normal chest excursion without splinting or tachypnea; breath sounds clear and equal bilaterally; no wheezes, no rhonchi, no rales,  ABD/GI: Normal bowel sounds; non-distended;  soft, non-tender, no rebound, no guarding BACK:  The back appears normal and is non-tender to palpation, there is no CVA tenderness EXT: Normal ROM in all joints; non-tender to palpation; no edema; normal capillary refill; no cyanosis    SKIN: Normal color for age and race; warm NEURO: Patient here with mild right-sided weakness. She does have a small amount of pronator drift in the right upper and lower extremity but reports she feels this is improving. No numbness  or tingling. Cranial nerves II through XII intact. NIH stroke scale 2. PSYCH: The patient's mood and manner are appropriate. Grooming and personal hygiene are appropriate.  MEDICAL DECISION MAKING: Patient here with possible TIA. She reports that her right-sided weakness is improving with time. Because of this she would not be a TPA candidate. We'll obtain labs, urine, head CT. I feel she will need admission to the hospital. PCP is with [facility].  ED PROGRESS: Patient's labs and urine are unremarkable. Head CT shows no acute abnormality. Patient reports her symptoms are still resolving and almost completely gone. Discussed with Dr. Lendell Caprice with hospitalist service at [facility] who agrees to accept the patient in transfer to telemetry bed, observation. Updated patient and family.      EKG Interpretation  Date/Time:  Saturday March 13 2014 10:40:30 EST Ventricular Rate:  74 PR Interval:  136 QRS Duration: 84 QT Interval:  392 QTC Calculation: 435 R Axis:   56 Text Interpretation:  Normal sinus rhythm Normal ECG Confirmed by Landry Kamath,  DO, Isabelle Matt ([id number]) on 03/13/2014 10:48:42 AM        Layla Maw Kyrstin Campillo, DO 03/13/14 1341

## 2014-03-13 NOTE — Progress Notes (Signed)
MRI called writer to report that patient had a seizure like activity x 2 while in MRI. They reported no loss of consciousness. MD made aware verbally.

## 2014-03-13 NOTE — Consult Note (Signed)
Neurology Consultation Reason for Consult: Right-sided weakness Referring Physician: Jeanella Anton  CC: Right-sided weakness  History is obtained from: Patient   HPI: Melinda Woods is a 49 y.o. female with a history of hypertension who presents with right-sided weakness present since this morning. She states that she was in the shower and her leg gave out on her, and her husband caught her. She then came to the hospital. She states that it is been persistent since that time without improvement, and is still persistent at this time. She denies any numbness.  She also notes intermittent episodes of shaking on the right arm and leg. They last less than a minute, and it happened twice since this started.  She also complains of bilateral foot pain which is been ongoing for months.   Of note, she has just started a new job, and was supposed to work today.  LKW: 9 AM tpa given?: no, not a stroke    ROS: A 14 point ROS was performed and is negative except as noted in the HPI.   Past Medical History  Diagnosis Date  . Hypertension   . Anxiety   . Ovarian cyst     Family History: No history of nerve problems  Social History: Tob: Denies  Exam: Current vital signs: BP 121/76 mmHg  Pulse 64  Temp(Src) 98.6 F (37 C) (Oral)  Resp 20  SpO2 95%  LMP 02/27/2014 Vital signs in last 24 hours: Temp:  [98.6 F (37 C)] 98.6 F (37 C) (01/16 1500) Pulse Rate:  [63-91] 64 (01/16 1500) Resp:  [18-20] 20 (01/16 1500) BP: (108-131)/(67-76) 121/76 mmHg (01/16 1500) SpO2:  [95 %-100 %] 95 % (01/16 1500)   Physical Exam  Constitutional: Appears well-developed and well-nourished.  Psych: Affect appropriate to situation Eyes: No scleral injection HENT: No OP obstrucion Head: Normocephalic.  Cardiovascular: Normal rate and regular rhythm.  Respiratory: Effort normal and breath sounds normal to anterior ascultation GI: Soft.  No distension. There is no tenderness.  Skin:  WDI  Neuro: Mental Status: Patient is awake, alert, oriented to person, place, month, year, and situation. Patient is able to give a clear and coherent history. No signs of aphasia or neglect Cranial Nerves: II: Visual Fields are full. Pupils are equal, round, and reactive to light.   III,IV, VI: EOMI without ptosis or diploplia.  V: Facial sensation is symmetric to temperature VII: Facial movement is symmetric.  VIII: hearing is intact to voice X: Uvula elevates symmetrically XI: Shoulder shrug is symmetric. XII: tongue is midline without atrophy or fasciculations.  Motor: Tone is normal. Bulk is normal. 5/5 strength was present in bilateral upper extremities and left leg. She only gave 3-4 out of 5 strength in the right leg, though effort was very questionable. Sensory: Sensation is symmetric to light touch and temperature in the arms and legs. Deep Tendon Reflexes: 2+ and symmetric in the biceps and patellae. She also has good reflexes at the ankles. Plantars: Toes are downgoing bilaterally.  Cerebellar: FNF  intact bilaterally  She has onset of a mild low amplitude shaking of her arm while I am examining her. With contralateral distraction maneuvers, this movement abates completely. Then resumes once the contralateral movements are no longer being performed. She is able to use her arm functionally while these movements are continuing.       I have reviewed labs in epic and the results pertinent to this consultation are: Cmp, cbc - unremarkable  I have reviewed the images  obtained:MRI brain - negative MRI Cspine - negative, formal read pending.   Impression: 49 year old female with right-sided weakness. My suspicion is for psychogenic etiology given the persistent symptoms a negative MRI. An EEG is also being performed and this would be reasonable, though my suspicion for seizure is also very low. If EEG is negative and MRI C-spine is also formally read as negative, then  I'm not sure that further workup would need to be performed  Recommendations: 1) EEG 2) follow-up MRI C-spine 3) further recommendations following above studies   Ritta Slot, MD [facility] [phone]  If 7pm- 7am, please page neurology on call as listed in AMION.

## 2014-03-13 NOTE — Progress Notes (Signed)
Patient admitted from [facility]. Patient alert and oriented x 4. Patient oriented to the room and made comfortable. MD  Paged and notified.

## 2014-03-13 NOTE — H&P (Addendum)
[facility] History and Physical  Melinda Woods XBM:[id number] DOB: [birth year] DOA: 03/13/2014  Referring physician:  PCP: Pcp Not In System   Chief Complaint: Right-sided weakness  HPI:  49 y.o. female with history of hypertension, hyperlipidemia, anxiety who presents emergency department with complaints of right-sided weakness that started this morning at 9 AM. She states the past 7-8 months she has had numbness and tingling and feels like she is "walking on pins" in bilateral feet. This morning while taking a shower and 9 AM she felt her right side was much weaker in her right leg gave out on her. She did not fall and hit her head. She states that the right-sided weakness is slowly improving but not completely resolved. No new numbness. No facial droop or slurred speech. No prior history of stroke. Not on anticoagulation. Patient has chronic lower back pain which is unchanged. No neck pain. No headache. No bowel or bladder incontinence.Patient has been seeing Dr. Samuel Bouche for her legs and feet. States that she feels like she walks on pins, but this morning she was taking a shower and her feet "gave out"      Review of Systems: negative for the following  Constitutional: Denies fever, chills, diaphoresis, appetite change and fatigue.  HEENT: Denies photophobia, eye pain, redness, hearing loss, ear pain, congestion, sore throat, rhinorrhea, sneezing, mouth sores, trouble swallowing, neck pain, neck stiffness and tinnitus.  Respiratory: Denies SOB, DOE, cough, chest tightness, and wheezing.  Cardiovascular: Denies chest pain, palpitations and leg swelling.  Gastrointestinal: Denies nausea, vomiting, abdominal pain, diarrhea, constipation, blood in stool and abdominal distention.  Genitourinary: Denies dysuria, urgency, frequency, hematuria, flank pain and difficulty urinating.  Musculoskeletal: Denies myalgias, back pain, joint swelling, arthralgias and gait problem.  Skin: Denies pallor,  rash and wound.  Neurological: Right-sided, weakness, light-headedness, numbness and headaches.  Hematological: Denies adenopathy. Easy bruising, personal or family bleeding history  Psychiatric/Behavioral: Denies suicidal ideation, mood changes, confusion, nervousness, sleep disturbance and agitation       Past Medical History  Diagnosis Date  . Hypertension   . Anxiety   . Ovarian cyst      Past Surgical History  Procedure Laterality Date  . Tubal ligation        Social History:  reports that she has never smoked. She does not have any smokeless tobacco history on file. She reports that she does not drink alcohol or use illicit drugs.    Allergies  Allergen Reactions  . Oxycodone Base Hives    No family history on file.   Prior to Admission medications   Medication Sig Start Date End Date Taking? Authorizing Provider  etodolac (LODINE) 200 MG capsule Take 200 mg by mouth every 8 (eight) hours.   Yes Historical Provider, MD  diazepam (VALIUM) 5 MG tablet Take 1 tablet (5 mg total) by mouth 2 (two) times daily. 04/30/13   Gerhard Munch, MD  diphenhydrAMINE (BENADRYL) 25 mg capsule Take 25 mg by mouth every 6 (six) hours as needed. Patient uses this medication for allergies.    Historical Provider, MD  hydrocortisone cream 1 % Apply 1 application topically 2 (two) times daily. Patient used this medication for hives.    Historical Provider, MD  loratadine (CLARITIN) 10 MG tablet Take 10 mg by mouth daily. Patient uses this medication for allergies.    Historical Provider, MD  LORazepam (ATIVAN) 1 MG tablet Take 1 tablet (1 mg total) by mouth 3 (three) times daily as needed for anxiety. 12/22/12  Ethelda Chick, MD  mirtazapine (REMERON) 7.5 MG tablet Take 7.5 mg by mouth at bedtime.    Historical Provider, MD  traMADol (ULTRAM) 50 MG tablet Take 1 tablet (50 mg total) by mouth every 6 (six) hours as needed. 04/30/13   Gerhard Munch, MD     Physical Exam: Filed Vitals:    03/13/14 1342 03/13/14 1345 03/13/14 1400 03/13/14 1500  BP:  115/70 116/69 121/76  Pulse: 66 69 63 64  Temp:    98.6 F (37 C)  TempSrc:    Oral  Resp:    20  SpO2: 100% 99% 99% 95%     Constitutional: Vital signs reviewed. Patient is a well-developed and well-nourished in no acute distress and cooperative with exam. Alert and oriented x3.  Head: Normocephalic and atraumatic  Ear: TM normal bilaterally  Mouth: no erythema or exudates, MMM  Eyes: PERRL, EOMI, conjunctivae normal, No scleral icterus.  Neck: Supple, Trachea midline normal ROM, No JVD, mass, thyromegaly, or carotid bruit present.  Cardiovascular: RRR, S1 normal, S2 normal, no MRG, pulses symmetric and intact bilaterally  Pulmonary/Chest: CTAB, no wheezes, rales, or rhonchi  Abdominal: Soft. Non-tender, non-distended, bowel sounds are normal, no masses, organomegaly, or guarding present.  GU: no CVA tenderness Musculoskeletal: No joint deformities, erythema, or stiffness, ROM full and no nontender Ext: no edema and no cyanosis, pulses palpable bilaterally (DP and PT)  Hematology: no cervical, inginal, or axillary adenopathy.  Neurological: A&O x3, Strenght is normal and symmetric bilaterally, cranial nerve II-XII are grossly intact, no focal motor deficit, sensory intact to light touch bilaterally.  Skin: Warm, dry and intact. No rash, cyanosis, or clubbing.  Psychiatric: Normal mood and affect. speech and behavior is normal. Judgment and thought content normal. Cognition and memory are normal.       Labs on Admission:    Basic Metabolic Panel:  Recent Labs Lab 03/13/14 1050  NA 136  K 4.1  CL 105  CO2 23  GLUCOSE 101*  BUN 11  CREATININE 0.81  CALCIUM 9.0   Liver Function Tests:  Recent Labs Lab 03/13/14 1050  AST 23  ALT 18  ALKPHOS 75  BILITOT 1.0  PROT 8.4*  ALBUMIN 4.0   No results for input(s): LIPASE, AMYLASE in the last 168 hours. No results for input(s): AMMONIA in the last 168  hours. CBC:  Recent Labs Lab 03/13/14 1050 03/13/14 1600  WBC 4.8 4.7  NEUTROABS 2.0  --   HGB 13.1 12.1  HCT 39.5 36.1  MCV 90.4 89.6  PLT 219 239   Cardiac Enzymes:  Recent Labs Lab 03/13/14 1050  TROPONINI <0.03    BNP (last 3 results) No results for input(s): PROBNP in the last 8760 hours.    CBG: No results for input(s): GLUCAP in the last 168 hours.  Radiological Exams on Admission: Ct Head Wo Contrast  03/13/2014   CLINICAL DATA:  49 year old female with headache, bilateral foot numbness and right-sided weakness  EXAM: CT HEAD WITHOUT CONTRAST  TECHNIQUE: Contiguous axial images were obtained from the base of the skull through the vertex without intravenous contrast.  COMPARISON:  None.  FINDINGS: Negative for acute intracranial hemorrhage, acute infarction, mass, mass effect, hydrocephalus or midline shift. Gray-white differentiation is preserved throughout. No acute soft tissue or calvarial abnormality. The globes and orbits are symmetric and unremarkable. Normal aeration of the mastoid air cells and visualized paranasal sinuses.  IMPRESSION: Negative head CT.   Electronically Signed   By: Malachy Moan  M.D.   On: 03/13/2014 11:18    EKG: Independently reviewed. Date/Time:Saturday March 13 2014 10:40:30 EST Ventricular Rate: 74 PR Interval:136 QRS Duration:84 QT Interval:392 QTC Calculation:435 R Axis:56 Text Interpretation: Normal sinus rhythm Normal ECG Confirmed  Assessment/Plan Active Problems:   Right sided weakness   TIA (transient ischemic attack)   Right-sided weakness vs todd's paralysis? Patient admitted for a TIA workup MRI/MRA of the brain 2-D echo, carotid Doppler PT/OT consult Hemoglobin A1c, lipid panel Had 2 episodes of seizure like activity , will order EEG  Neuro consult   parasthesias We'll obtain MRI of the C-spine Check TSH,  vitamin B-12  Hypertension Currently on no medications at home Blood pressure stable  History of anxiety Patient takes Valium and lorazepam at home We'll continue with lorazepam and Remeron    Code Status:   full Family Communication: bedside Disposition Plan: admit   Time spent: 70 mins   [facility] Pager [phone]  If 7PM-7AM, please contact night-coverage [email or web address] Password [facility] 03/13/2014, 4:35 PM

## 2014-03-13 NOTE — Progress Notes (Signed)
Transfer to [facility] tele observation from [facility] for TIA workup.  Woke up at 7 am without problems. At 9 am, started having right sided weakness. H/o HTN, anxiety. CT brain without anything acute.  NIH stroke scale 2.  Mild right sided weakness improving in ED. TPA not given due to improving symptoms.  Admit for TIA workup.  CALL FLOW MANAGER ON ARRIVAL TO UNIT [phone]  Crista Curb, MD [facility]

## 2014-03-14 DIAGNOSIS — I517 Cardiomegaly: Secondary | ICD-10-CM

## 2014-03-14 LAB — LIPID PANEL
Cholesterol: 202 mg/dL — ABNORMAL HIGH (ref 0–200)
HDL: 29 mg/dL — AB (ref >39)
LDL Cholesterol: 136 mg/dL — ABNORMAL HIGH (ref 0–99)
Total CHOL/HDL Ratio: 7 RATIO
Triglycerides: 183 mg/dL — ABNORMAL HIGH (ref <150)
VLDL: 37 mg/dL (ref 0–40)

## 2014-03-14 LAB — GLUCOSE, CAPILLARY
Glucose-Capillary: 95 mg/dL (ref 70–99)
Glucose-Capillary: 118 mg/dL — ABNORMAL HIGH (ref 70–99)

## 2014-03-14 LAB — RAPID URINE DRUG SCREEN, HOSP PERFORMED
Amphetamines: NOT DETECTED
Barbiturates: NOT DETECTED
Benzodiazepines: NOT DETECTED
Cocaine: NOT DETECTED
Opiates: NOT DETECTED
TETRAHYDROCANNABINOL: POSITIVE — AB

## 2014-03-14 LAB — VITAMIN B12: Vitamin B-12: 637 pg/mL (ref 211–911)

## 2014-03-14 NOTE — Progress Notes (Addendum)
DC instructions given to patient and husband. Encouraged patient to follow up with PCP. All questions answered. Patient in [facility] to be escorted by staff to lobby. Patient to be transported in private vehicle by husband to home.

## 2014-03-14 NOTE — Progress Notes (Signed)
Utilization review completed.  

## 2014-03-14 NOTE — Progress Notes (Signed)
VASCULAR LAB PRELIMINARY  PRELIMINARY  PRELIMINARY  PRELIMINARY  Carotid duplex  completed.    Preliminary report:  Bilateral:  1-39% ICA stenosis.  Vertebral artery flow is antegrade.      Deray Dawes, RVT 03/14/2014, 10:29 AM

## 2014-03-14 NOTE — Discharge Instructions (Signed)

## 2014-03-14 NOTE — Progress Notes (Signed)
Echocardiogram 2D Echocardiogram has been performed.  Melinda Woods 03/14/2014, 1:45 PM

## 2014-03-14 NOTE — Discharge Summary (Signed)
Physician Discharge Summary  Melinda Woods LKT:[id number] DOB: [birth year] DOA: 03/13/2014  PCP: Pcp Not In System  Admit date: 03/13/2014 Discharge date: 03/14/2014  Recommendations for Outpatient Follow-up:  1. Pt will need to follow up with PCP in 2-3 weeks post discharge 2. Please obtain BMP to evaluate electrolytes and kidney function 3. Please also check CBC to evaluate Hg and Hct levels  Discharge Diagnoses:  Active Problems:   Right sided weakness   TIA (transient ischemic attack)    Discharge Condition: Stable  Diet recommendation: Heart healthy diet discussed in details   History of present illness:  49 y.o. female with history of hypertension, hyperlipidemia, anxiety who presented emergency department with complaints of right-sided weakness that started am several hours piror to this admission. She states the past 7-8 months she has had numbness and tingling and feels like she is "walking on pins" in bilateral feet. She stated that the right-sided weakness was slowly improving but not completely resolved. No new numbness. No facial droop or slurred speech. No prior history of stroke. Not on anticoagulation.   Hospital Course:  Active Problems:   Right sided weakness - negative MRI brain - symptoms resolved and pt insisting on going home today  - 2 D ECHO with normal EF - Pt declined HH services, rolling walker to be provided upon discharge    Procedures/Studies: Ct Head Wo Contrast  03/13/2014   Negative head CT.    Mri Brain Without Contrast  03/13/2014   Normal non contrast MRI appearance of the brain. 2.  Negative intracranial MRA.     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 Mild spinal cord mass effect at each level with no cord signal abnormality. 2. Associated moderate right C5, moderate left C6, and mild right C6 foraminal stenosis..  Mr [name] Head/brain Wo Cm  03/13/2014  Normal non contrast MRI  appearance of the brain. 2.  Negative intracranial MRA.     Consultations:  Neurology   Antibiotics:  None   Discharge Exam: Filed Vitals:   03/14/14 1346  BP: 113/77  Pulse: 76  Temp: 98.4 F (36.9 C)  Resp: 20   Filed Vitals:   03/14/14 0300 03/14/14 0526 03/14/14 1016 03/14/14 1346  BP: 139/67 110/74 124/77 113/77  Pulse: 71 69 87 76  Temp: 98.3 F (36.8 C) 98.4 F (36.9 C) 98.4 F (36.9 C) 98.4 F (36.9 C)  TempSrc: Oral Oral Oral Oral  Resp: 20 18 20 20   SpO2: 98% 100% 97% 96%    General: Pt is alert, follows commands appropriately, not in acute distress Cardiovascular: Regular rate and rhythm, no rubs, no gallops Respiratory: Clear to auscultation bilaterally, no wheezing, no crackles, no rhonchi Abdominal: Soft, non tender, non distended, bowel sounds +, no guarding Extremities: no edema, no cyanosis, pulses palpable bilaterally DP and PT Neuro: Grossly nonfocal  Discharge Instructions  Discharge Instructions    Diet - low sodium heart healthy    Complete by:  As directed      Diet - low sodium heart healthy    Complete by:  As directed      Increase activity slowly    Complete by:  As directed      Increase activity slowly    Complete by:  As directed             Medication List    TAKE these medications  diazepam 5 MG tablet  Commonly known as:  VALIUM  Take 1 tablet (5 mg total) by mouth 2 (two) times daily.     etodolac 200 MG capsule  Commonly known as:  LODINE  Take 200 mg by mouth 2 (two) times daily.     loratadine 10 MG tablet  Commonly known as:  CLARITIN  Take 10 mg by mouth daily as needed (hives). Patient uses this medication for allergies.     LORazepam 1 MG tablet  Commonly known as:  ATIVAN  Take 1 tablet (1 mg total) by mouth 3 (three) times daily as needed for anxiety.     traMADol 50 MG tablet  Commonly known as:  ULTRAM  Take 1 tablet (50 mg total) by mouth every 6 (six) hours as needed.          The  results of significant diagnostics from this hospitalization (including imaging, microbiology, ancillary and laboratory) are listed below for reference.     Microbiology: No results found for this or any previous visit (from the past 240 hour(s)).   Labs: Basic Metabolic Panel:  Recent Labs Lab 03/13/14 1050 03/13/14 1600  NA 136  --   K 4.1  --   CL 105  --   CO2 23  --   GLUCOSE 101*  --   BUN 11  --   CREATININE 0.81 0.77  CALCIUM 9.0  --    Liver Function Tests:  Recent Labs Lab 03/13/14 1050  AST 23  ALT 18  ALKPHOS 75  BILITOT 1.0  PROT 8.4*  ALBUMIN 4.0   CBC:  Recent Labs Lab 03/13/14 1050 03/13/14 1600  WBC 4.8 4.7  NEUTROABS 2.0  --   HGB 13.1 12.1  HCT 39.5 36.1  MCV 90.4 89.6  PLT 219 239   Cardiac Enzymes:  Recent Labs Lab 03/13/14 1050  TROPONINI <0.03   CBG:  Recent Labs Lab 03/13/14 2233 03/14/14 0636 03/14/14 1103  GLUCAP 123* 95 118*   SIGNED: Time coordinating discharge: Over 30 minutes  Debbora Presto, MD  [facility] 03/14/2014, 3:40 PM Pager [phone]  If 7PM-7AM, please contact night-coverage [email or web address] Password [email or web address]

## 2014-03-14 NOTE — Progress Notes (Signed)
Physical Therapy Evaluation Patient Details Name: Melinda Woods MRN: [id number] DOB: 1965-07-28 Today's Date: 03/14/2014   History of Present Illness  Patient is a 49 yo female admitted 03/13/14 with Rt-sided weakness, intermittent shaking of RUE/RLE.  Also reports pain in bil feet (for several months).  PMH:  HTN, anxiety, chronic back pain  Clinical Impression  Patient presents with problems listed below.  Will benefit from acute PT to maximize independence prior to return home with husband.  Patient with weakness RLE (question effort - testing did not match functional ability).  Was able to take steps with RW and min assist.  Question the shaking and buckling of LE's - able to self-correct.  Limiting factor for mobility seemed to be pain in both feet.  Patient reports 10/10 pain that has been ongoing for 7-8 months.  Discussed with patient and husband safe way to get patient into house at d/c.  Offered HHPT - patient declines, with husband stating he "can help her".    Follow Up Recommendations No PT follow up;Supervision/Assistance - 24 hour (Patient declined HH.  Husband states "I can help her")    Equipment Recommendations  Rolling walker with 5" wheels    Recommendations for Other Services       Precautions / Restrictions Precautions Precautions: Fall Precaution Comments: Patient reports her legs "gave way" at home.  Her husband stopped her from falling. Restrictions Weight Bearing Restrictions: No      Mobility  Bed Mobility Overal bed mobility: Modified Independent                Transfers Overall transfer level: Needs assistance Equipment used: None;Rolling walker (2 wheeled) Transfers: Sit to/from Stand Sit to Stand: Min assist         General transfer comment: Verbal cues for hand placement.  Assist to rise to standing.  Patient with knees buckling, but able to self-correct.  Performed sit <> stand x2.  Ambulation/Gait Ambulation/Gait assistance: Min  assist Ambulation Distance (Feet): 6 Feet (Forward and backward at bed) Assistive device: Rolling walker (2 wheeled) Gait Pattern/deviations: Step-through pattern;Shuffle     General Gait Details: Verbal cues for safe use of RW.  On first stance, patient c/o 10/10 pain in Bil feet.  Stood approx 25 seconds. Noted Rt knee buckling, but patient able to self-correct.  On second stance, encouraged patient to ambulate forward/backward staying close to bed.  No knee buckling noted during gait.  Patient fearful of falling.  Stairs            Wheelchair Mobility    Modified Rankin (Stroke Patients Only) Modified Rankin (Stroke Patients Only) Pre-Morbid Rankin Score: No symptoms Modified Rankin: Moderately severe disability     Balance                                             Pertinent Vitals/Pain Pain Assessment: 0-10 Pain Score: 10-Worst pain ever Pain Location: Bil feet in standing Pain Descriptors / Indicators: Sharp;Stabbing;Pins and needles Pain Intervention(s): Limited activity within patient's tolerance;Premedicated before session;Repositioned    Home Living Family/patient expects to be discharged to:: Private residence Living Arrangements: Spouse/significant other Available Help at Discharge: Family;Available 24 hours/day (Husband and niece) Type of Home: House Home Access: Stairs to enter   [employer] of Steps: 2 Home Layout: One level Home Equipment: None      Prior Function Level of  Independence: Independent         Comments: Works in [employer].  Reports she does not drive.     Hand Dominance        Extremity/Trunk Assessment   Upper Extremity Assessment: Overall WFL for tasks assessed           Lower Extremity Assessment: Overall WFL for tasks assessed;RLE deficits/detail RLE Deficits / Details: Tested 4/5 to 4-/5 - ? effort    Cervical / Trunk Assessment: Normal  Communication   Communication: No  difficulties  Cognition Arousal/Alertness: Awake/alert Behavior During Therapy: Anxious Overall Cognitive Status: Within Functional Limits for tasks assessed                      General Comments      Exercises        Assessment/Plan    PT Assessment Patient needs continued PT services  PT Diagnosis Difficulty walking;Acute pain;Hemiplegia dominant side   PT Problem List Decreased strength;Decreased activity tolerance;Decreased balance;Decreased mobility;Pain;Obesity  PT Treatment Interventions DME instruction;Gait training;Functional mobility training;Therapeutic activities;Therapeutic exercise;Patient/family education   PT Goals (Current goals can be found in the Care Plan section) Acute Rehab PT Goals Patient Stated Goal: To feel better PT Goal Formulation: With patient/family Time For Goal Achievement: 03/21/14 Potential to Achieve Goals: Good    Frequency Min 3X/week   Barriers to discharge        Co-evaluation               End of Session Equipment Utilized During Treatment: Gait belt Activity Tolerance: Patient limited by pain Patient left: in bed;with call bell/phone within reach;with family/visitor present Nurse Communication: Mobility status    Functional Assessment Tool Used: Clinical judgement Functional Limitation: Mobility: Walking and moving around Mobility: Walking and Moving Around Current Status [phone]): At least 20 percent but less than 40 percent impaired, limited or restricted Mobility: Walking and Moving Around Goal Status [phone]): At least 1 percent but less than 20 percent impaired, limited or restricted    Time: 1338-1406 PT Time Calculation (min) (ACUTE ONLY): 28 min   Charges:   PT Evaluation $Initial PT Evaluation Tier I: 1 Procedure PT Treatments $Therapeutic Activity: 23-37 mins   PT G Codes:   PT G-Codes **NOT FOR INPATIENT CLASS** Functional Assessment Tool Used: Clinical judgement Functional Limitation:  Mobility: Walking and moving around Mobility: Walking and Moving Around Current Status (M0768): At least 20 percent but less than 40 percent impaired, limited or restricted Mobility: Walking and Moving Around Goal Status [phone]): At least 1 percent but less than 20 percent impaired, limited or restricted    Vena Austria 03/14/2014, 2:59 PM Durenda Hurt. Renaldo Fiddler, [facility] Acute Rehab Services Pager [phone]

## 2016-04-13 ENCOUNTER — Emergency Department (HOSPITAL_BASED_OUTPATIENT_CLINIC_OR_DEPARTMENT_OTHER): Payer: 59

## 2016-04-13 ENCOUNTER — Emergency Department (HOSPITAL_BASED_OUTPATIENT_CLINIC_OR_DEPARTMENT_OTHER)
Admission: EM | Admit: 2016-04-13 | Discharge: 2016-04-13 | Disposition: A | Discharge status: Home / Self Care | Payer: 59 | Attending: Emergency Medicine | Admitting: Emergency Medicine

## 2016-04-13 ENCOUNTER — Encounter (HOSPITAL_BASED_OUTPATIENT_CLINIC_OR_DEPARTMENT_OTHER): Payer: Self-pay | Admitting: *Deleted

## 2016-04-13 DIAGNOSIS — J40 Bronchitis, not specified as acute or chronic: Secondary | ICD-10-CM

## 2016-04-13 DIAGNOSIS — J209 Acute bronchitis, unspecified: Secondary | ICD-10-CM | POA: Diagnosis not present

## 2016-04-13 DIAGNOSIS — L0231 Cutaneous abscess of buttock: Secondary | ICD-10-CM | POA: Diagnosis not present

## 2016-04-13 DIAGNOSIS — I10 Essential (primary) hypertension: Secondary | ICD-10-CM | POA: Insufficient documentation

## 2016-04-13 DIAGNOSIS — R0602 Shortness of breath: Secondary | ICD-10-CM | POA: Diagnosis present

## 2016-04-13 MED ORDER — ALBUTEROL SULFATE (2.5 MG/3ML) 0.083% IN NEBU
INHALATION_SOLUTION | RESPIRATORY_TRACT | Status: AC
  Administered 2016-04-13: 2.5 mg via RESPIRATORY_TRACT
  Filled 2016-04-13: qty 3

## 2016-04-13 MED ORDER — ALBUTEROL SULFATE HFA 108 (90 BASE) MCG/ACT IN AERS
2.0000 | INHALATION_SPRAY | RESPIRATORY_TRACT | Status: DC | PRN
  Filled 2016-04-13: qty 6.7

## 2016-04-13 MED ORDER — IPRATROPIUM-ALBUTEROL 0.5-2.5 (3) MG/3ML IN SOLN
RESPIRATORY_TRACT | Status: AC
Start: 2016-04-13 — End: 2016-04-13
  Administered 2016-04-13: 3 mL via RESPIRATORY_TRACT
  Filled 2016-04-13: qty 3

## 2016-04-13 MED ORDER — IPRATROPIUM-ALBUTEROL 0.5-2.5 (3) MG/3ML IN SOLN
3.0000 mL | Freq: Once | RESPIRATORY_TRACT | Status: AC
  Administered 2016-04-13: 3 mL via RESPIRATORY_TRACT

## 2016-04-13 MED ORDER — ALBUTEROL SULFATE (2.5 MG/3ML) 0.083% IN NEBU
2.5000 mg | INHALATION_SOLUTION | Freq: Once | RESPIRATORY_TRACT | Status: AC
  Administered 2016-04-13: 2.5 mg via RESPIRATORY_TRACT

## 2016-04-13 MED ORDER — ALBUTEROL SULFATE (2.5 MG/3ML) 0.083% IN NEBU
5.0000 mg | INHALATION_SOLUTION | Freq: Once | RESPIRATORY_TRACT | Status: AC
  Administered 2016-04-13: 5 mg via RESPIRATORY_TRACT

## 2016-04-13 MED ORDER — AEROCHAMBER PLUS W/MASK MISC
1.0000 | Freq: Once | Status: AC
  Administered 2016-04-13: 1
  Filled 2016-04-13: qty 1

## 2016-04-13 MED ORDER — ALBUTEROL SULFATE (2.5 MG/3ML) 0.083% IN NEBU
INHALATION_SOLUTION | RESPIRATORY_TRACT | Status: AC
  Filled 2016-04-13: qty 6

## 2016-04-13 NOTE — Discharge Instructions (Signed)
Use your inhaler with spacer 2 puffs every 4 hours as needed for cough or shortness of breath. Return if needed more than every 4 hours. Sit in warm bathtub 4 times daily for 30 minutes at a time which will help with the boil on your buttock. Return or see your primary care physician if the boil does not continue to improve within the next 3 or 4 days. Return if your condition worsens for any reason

## 2016-04-13 NOTE — ED Triage Notes (Signed)
Pt reports cough, congestion with increasing sob x 3 days, denies any fevers, dry cough noted at triage. ekg performed while pt being triaged. Pt assisted into gown, dyspnea noted with minimal exertion.

## 2016-04-13 NOTE — ED Provider Notes (Addendum)
[facility] Provider Note   CSN: [id number] Arrival date & time: 04/13/16  7829     History   Chief Complaint Chief Complaint  Patient presents with  . Shortness of Breath    HPI Melinda Woods is a 51 y.o. female.Complains of cough productive of white sputum onset 3 days ago accompanied by shortness of breath. She denies any fever. No treatment prior to coming here. Nothing makes symptoms better or worse.  HPI  Past Medical History:  Diagnosis Date  . Anxiety   . Hypertension   . Ovarian cyst     Patient Active Problem List   Diagnosis Date Noted  . Right sided weakness 03/13/2014  . TIA (transient ischemic attack) 03/13/2014    Past Surgical History:  Procedure Laterality Date  . TUBAL LIGATION      OB History    No data available       Home Medications    Prior to Admission medications   Medication Sig Start Date End Date Taking? Authorizing Provider  AMITRIPTYLINE HCL PO Take by mouth.   Yes Historical Provider, MD    Family History History reviewed. No pertinent family history.  Social History Social History  Substance Use Topics  . Smoking status: Never Smoker  . Smokeless tobacco: Never Used  . Alcohol use No    Ex-smoker quit 10 years ago.  Allergies   Oxycodone base   Review of Systems Review of Systems  Constitutional: Negative.   HENT: Negative.   Respiratory: Positive for cough, shortness of breath and wheezing.   Cardiovascular: Negative.   Gastrointestinal: Negative.   Musculoskeletal: Positive for arthralgias.       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       "Boil" on buttock for 2 days, which she states is improving after treatment with Campho-Phenique  Neurological: Negative.   Psychiatric/Behavioral: Negative.   All other systems reviewed and are negative.    Physical Exam Updated Vital Signs BP 134/91 (BP Location: Right Arm)   Pulse 106   Temp 97.7 F  (36.5 C) (Axillary)   Resp 24   LMP 03/29/2016   SpO2 100%   Physical Exam  Constitutional: She appears well-developed and well-nourished.  HENT:  Head: Normocephalic and atraumatic.  Eyes: Conjunctivae are normal. Pupils are equal, round, and reactive to light.  Neck: Neck supple. No JVD present. No tracheal deviation present. No thyromegaly present.  Cardiovascular: Regular rhythm.   No murmur heard. Mildly tachycardic  Pulmonary/Chest: She has wheezes.  Expiratory wheezes, coughing  Abdominal: Soft. Bowel sounds are normal. She exhibits no distension. There is no tenderness.  Obese  Musculoskeletal: Normal range of motion. She exhibits no edema or tenderness.  Neurological: She is alert. Coordination normal.  Skin: Skin is warm and dry. No rash noted.  Right buttock 1 cm tender area several centimeters lateral to the rectum no fluctuance no redness.  Psychiatric: She has a normal mood and affect.  Nursing note and vitals reviewed.    ED Treatments / Results  Labs (all labs ordered are listed, but only abnormal results are displayed) Labs Reviewed - No data to display  EKG  EKG Interpretation  Date/Time:  Friday April 13 2016 08:28:17 EST Ventricular Rate:  107 PR Interval:    QRS Duration: 92 QT Interval:  351 QTC Calculation: 469 R Axis:   34 Text Interpretation:  Sinus tachycardia Low voltage, precordial leads Borderline T abnormalities, anterior leads SINCE LAST  TRACING HEART RATE HAS INCREASED Confirmed by Ethelda Chick  MD, Oluwatosin Bracy [phone]) on 04/13/2016 8:43:22 AM      Chest x-ray viewed by me Radiology No results found. Results for orders placed or performed during the hospital encounter of 03/13/14  Protime-INR  Result Value Ref Range   Prothrombin Time 12.2 11.6 - 15.2 seconds   INR 0.90 0.00 - 1.49  APTT  Result Value Ref Range   aPTT 30 24 - 37 seconds  CBC  Result Value Ref Range   WBC 4.8 4.0 - 10.5 K/uL   RBC 4.37 3.87 - 5.11 MIL/uL    Hemoglobin 13.1 12.0 - 15.0 g/dL   HCT 60.4 54.0 - 98.1 %   MCV 90.4 78.0 - 100.0 fL   MCH 30.0 26.0 - 34.0 pg   MCHC 33.2 30.0 - 36.0 g/dL   RDW 19.1 47.8 - 29.5 %   Platelets 219 150 - 400 K/uL  Differential  Result Value Ref Range   Neutrophils Relative % 41 (L) 43 - 77 %   Neutro Abs 2.0 1.7 - 7.7 K/uL   Lymphocytes Relative 45 12 - 46 %   Lymphs Abs 2.1 0.7 - 4.0 K/uL   Monocytes Relative 12 3 - 12 %   Monocytes Absolute 0.6 0.1 - 1.0 K/uL   Eosinophils Relative 2 0 - 5 %   Eosinophils Absolute 0.1 0.0 - 0.7 K/uL   Basophils Relative 0 0 - 1 %   Basophils Absolute 0.0 0.0 - 0.1 K/uL  Comprehensive metabolic panel  Result Value Ref Range   Sodium 136 135 - 145 mmol/L   Potassium 4.1 3.5 - 5.1 mmol/L   Chloride 105 96 - 112 mEq/L   CO2 23 19 - 32 mmol/L   Glucose, Bld 101 (H) 70 - 99 mg/dL   BUN 11 6 - 23 mg/dL   Creatinine, Ser 6.21 0.50 - 1.10 mg/dL   Calcium 9.0 8.4 - 30.8 mg/dL   Total Protein 8.4 (H) 6.0 - 8.3 g/dL   Albumin 4.0 3.5 - 5.2 g/dL   AST 23 0 - 37 U/L   ALT 18 0 - 35 U/L   Alkaline Phosphatase 75 39 - 117 U/L   Total Bilirubin 1.0 0.3 - 1.2 mg/dL   GFR calc non Af Amer 85 (L) >90 mL/min   GFR calc Af Amer >90 >90 mL/min   Anion gap 8 5 - 15  Urinalysis, Routine w reflex microscopic  Result Value Ref Range   Color, Urine YELLOW YELLOW   APPearance CLEAR CLEAR   Specific Gravity, Urine 1.012 1.005 - 1.030   pH 6.0 5.0 - 8.0   Glucose, UA NEGATIVE NEGATIVE mg/dL   Hgb urine dipstick NEGATIVE NEGATIVE   Bilirubin Urine LARGE (A) NEGATIVE   Ketones, ur NEGATIVE NEGATIVE mg/dL   Protein, ur NEGATIVE NEGATIVE mg/dL   Urobilinogen, UA 1.0 0.0 - 1.0 mg/dL   Nitrite NEGATIVE NEGATIVE   Leukocytes, UA NEGATIVE NEGATIVE  Troponin I  Result Value Ref Range   Troponin I <0.03 <0.031 ng/mL  Hemoglobin A1c  Result Value Ref Range   Hgb A1c MFr Bld 5.5 <5.7 %   Mean Plasma Glucose 111 <117 mg/dL  Urine rapid drug screen (hosp performed)  Result Value Ref  Range   Opiates NONE DETECTED NONE DETECTED   Cocaine NONE DETECTED NONE DETECTED   Benzodiazepines NONE DETECTED NONE DETECTED   Amphetamines NONE DETECTED NONE DETECTED   Tetrahydrocannabinol POSITIVE (A) NONE DETECTED   Barbiturates NONE  DETECTED NONE DETECTED  CBC  Result Value Ref Range   WBC 4.7 4.0 - 10.5 K/uL   RBC 4.03 3.87 - 5.11 MIL/uL   Hemoglobin 12.1 12.0 - 15.0 g/dL   HCT 62.1 30.8 - 65.7 %   MCV 89.6 78.0 - 100.0 fL   MCH 30.0 26.0 - 34.0 pg   MCHC 33.5 30.0 - 36.0 g/dL   RDW 84.6 96.2 - 95.2 %   Platelets 239 150 - 400 K/uL  Creatinine, serum  Result Value Ref Range   Creatinine, Ser 0.77 0.50 - 1.10 mg/dL   GFR calc non Af Amer >90 >90 mL/min   GFR calc Af Amer >90 >90 mL/min  Vitamin B12  Result Value Ref Range   Vitamin B-12 637 211 - 911 pg/mL  TSH  Result Value Ref Range   TSH 3.304 0.350 - 4.500 uIU/mL  Fasting lipid panel  Result Value Ref Range   Cholesterol 202 (H) 0 - 200 mg/dL   Triglycerides 841 (H) <150 mg/dL   HDL 29 (L) >32 mg/dL   Total CHOL/HDL Ratio 7.0 RATIO   VLDL 37 0 - 40 mg/dL   LDL Cholesterol 440 (H) 0 - 99 mg/dL  Glucose, capillary  Result Value Ref Range   Glucose-Capillary 123 (H) 70 - 99 mg/dL   Comment 1 Documented in Chart    Comment 2 Notify RN   Glucose, capillary  Result Value Ref Range   Glucose-Capillary 95 70 - 99 mg/dL  Glucose, capillary  Result Value Ref Range   Glucose-Capillary 118 (H) 70 - 99 mg/dL   Comment 1 Notify RN    Comment 2 Documented in Chart    Dg Chest 2 View  Result Date: 04/13/2016 CLINICAL DATA:  Shortness of breath for 3 days. EXAM: CHEST  2 VIEW COMPARISON:  December 22, 2012 FINDINGS: Lungs are clear. The heart size and pulmonary vascularity are normal. No adenopathy. No bone lesions. IMPRESSION: No edema or consolidation. Electronically Signed   By: Bretta Bang III M.D.   On: 04/13/2016 09:20   Procedures Procedures (including critical care time)  Medications Ordered in  ED Medications  ipratropium-albuterol (DUONEB) 0.5-2.5 (3) MG/3ML nebulizer solution 3 mL (3 mLs Nebulization Given 04/13/16 0836)  albuterol (PROVENTIL) (2.5 MG/3ML) 0.083% nebulizer solution 2.5 mg (2.5 mg Nebulization Given 04/13/16 0836)     Initial Impression / Assessment and Plan / ED Course  I have reviewed the triage vital signs and the nursing notes.  Pertinent labs & imaging results that were available during my care of the patient were reviewed by me and considered in my medical decision making (see chart for details).     11:15 AM after 2 nebulized treatments patient's breathing is almost normal and she feels ready to go home. She is coughing occasionally. On repeat exam she speaks in paragraphs no respiratory distress. Lungs clear to auscultation. She is able to ambulate without dyspnea. She'll be given an albuterol HFA with spacer to go to use 2 puffs every 4 hours as needed for shortness of breath or cough. Return if needed more than every 4 hours. Advised to sit in warm bathtub 4 times daily 30 minutes at a time. In light of the fact that the "boil" is improving I don't feel that she needs I&D. Presently. She is also advised to return if "boil" on her buttock worsens or see PMD Final Clinical Impressions(s) / ED Diagnoses  Diagnosis #1 acute bronchitis #2abscess of right buttock Final diagnoses:  None    New Prescriptions New Prescriptions   No medications on file     Doug Sou, MD 04/13/16 1122    Doug Sou, MD 04/13/16 1123    Doug Sou, MD 04/13/16 1123

## 2016-04-23 ENCOUNTER — Inpatient Hospital Stay (HOSPITAL_BASED_OUTPATIENT_CLINIC_OR_DEPARTMENT_OTHER)
Admission: EM | Admit: 2016-04-23 | Discharge: 2016-04-26 | DRG: 291 | Disposition: A | Discharge status: Home / Self Care | Payer: 59 | Attending: Internal Medicine | Admitting: Internal Medicine

## 2016-04-23 ENCOUNTER — Emergency Department (HOSPITAL_BASED_OUTPATIENT_CLINIC_OR_DEPARTMENT_OTHER): Payer: 59

## 2016-04-23 ENCOUNTER — Encounter (HOSPITAL_BASED_OUTPATIENT_CLINIC_OR_DEPARTMENT_OTHER): Payer: Self-pay | Admitting: Emergency Medicine

## 2016-04-23 DIAGNOSIS — I110 Hypertensive heart disease with heart failure: Principal | ICD-10-CM | POA: Diagnosis present

## 2016-04-23 DIAGNOSIS — I509 Heart failure, unspecified: Secondary | ICD-10-CM | POA: Diagnosis not present

## 2016-04-23 DIAGNOSIS — J9811 Atelectasis: Secondary | ICD-10-CM | POA: Diagnosis present

## 2016-04-23 DIAGNOSIS — G629 Polyneuropathy, unspecified: Secondary | ICD-10-CM

## 2016-04-23 DIAGNOSIS — K760 Fatty (change of) liver, not elsewhere classified: Secondary | ICD-10-CM | POA: Diagnosis present

## 2016-04-23 DIAGNOSIS — J9601 Acute respiratory failure with hypoxia: Secondary | ICD-10-CM | POA: Diagnosis present

## 2016-04-23 DIAGNOSIS — I5033 Acute on chronic diastolic (congestive) heart failure: Secondary | ICD-10-CM | POA: Diagnosis present

## 2016-04-23 DIAGNOSIS — Z833 Family history of diabetes mellitus: Secondary | ICD-10-CM | POA: Diagnosis not present

## 2016-04-23 DIAGNOSIS — J9600 Acute respiratory failure, unspecified whether with hypoxia or hypercapnia: Secondary | ICD-10-CM

## 2016-04-23 DIAGNOSIS — R0600 Dyspnea, unspecified: Secondary | ICD-10-CM

## 2016-04-23 DIAGNOSIS — G4733 Obstructive sleep apnea (adult) (pediatric): Secondary | ICD-10-CM | POA: Diagnosis present

## 2016-04-23 DIAGNOSIS — I10 Essential (primary) hypertension: Secondary | ICD-10-CM | POA: Diagnosis not present

## 2016-04-23 DIAGNOSIS — Z8249 Family history of ischemic heart disease and other diseases of the circulatory system: Secondary | ICD-10-CM

## 2016-04-23 DIAGNOSIS — R0902 Hypoxemia: Secondary | ICD-10-CM

## 2016-04-23 DIAGNOSIS — R0609 Other forms of dyspnea: Secondary | ICD-10-CM | POA: Diagnosis not present

## 2016-04-23 DIAGNOSIS — I5032 Chronic diastolic (congestive) heart failure: Secondary | ICD-10-CM | POA: Diagnosis present

## 2016-04-23 DIAGNOSIS — Z6841 Body Mass Index (BMI) 40.0 and over, adult: Secondary | ICD-10-CM | POA: Diagnosis not present

## 2016-04-23 DIAGNOSIS — R0602 Shortness of breath: Secondary | ICD-10-CM | POA: Diagnosis not present

## 2016-04-23 DIAGNOSIS — E785 Hyperlipidemia, unspecified: Secondary | ICD-10-CM | POA: Diagnosis present

## 2016-04-23 DIAGNOSIS — I5031 Acute diastolic (congestive) heart failure: Secondary | ICD-10-CM | POA: Diagnosis not present

## 2016-04-23 DIAGNOSIS — R1011 Right upper quadrant pain: Secondary | ICD-10-CM

## 2016-04-23 HISTORY — DX: Polyneuropathy, unspecified: G62.9

## 2016-04-23 LAB — COMPREHENSIVE METABOLIC PANEL
ALBUMIN: 3.7 g/dL (ref 3.5–5.0)
ALT: 27 U/L (ref 14–54)
ANION GAP: 8 (ref 5–15)
AST: 31 U/L (ref 15–41)
Alkaline Phosphatase: 75 U/L (ref 38–126)
BILIRUBIN TOTAL: 0.5 mg/dL (ref 0.3–1.2)
BUN: 8 mg/dL (ref 6–20)
CO2: 28 mmol/L (ref 22–32)
Calcium: 9.3 mg/dL (ref 8.9–10.3)
Chloride: 102 mmol/L (ref 101–111)
Creatinine, Ser: 0.94 mg/dL (ref 0.44–1.00)
GFR calc non Af Amer: >60 mL/min (ref >60)
GLUCOSE: 97 mg/dL (ref 65–99)
POTASSIUM: 4.3 mmol/L (ref 3.5–5.1)
SODIUM: 138 mmol/L (ref 135–145)
TOTAL PROTEIN: 8.6 g/dL — AB (ref 6.5–8.1)

## 2016-04-23 LAB — TROPONIN I: Troponin I: <0.03 ng/mL (ref <0.03)

## 2016-04-23 LAB — BRAIN NATRIURETIC PEPTIDE: B NATRIURETIC PEPTIDE 5: 8 pg/mL (ref 0.0–100.0)

## 2016-04-23 LAB — BLOOD GAS, ARTERIAL
Acid-Base Excess: 1.2 mmol/L (ref 0.0–2.0)
Bicarbonate: 25.8 mmol/L (ref 20.0–28.0)
DRAWN BY: 295031
O2 CONTENT: 3 L/min
O2 SAT: 96.6 %
PCO2 ART: 43.3 mmHg (ref 32.0–48.0)
PH ART: 7.393 (ref 7.350–7.450)
Patient temperature: 98.6
pO2, Arterial: 96.8 mmHg (ref 83.0–108.0)

## 2016-04-23 LAB — CBC
HEMATOCRIT: 40 % (ref 36.0–46.0)
Hemoglobin: 12.9 g/dL (ref 12.0–15.0)
MCH: 29.6 pg (ref 26.0–34.0)
MCHC: 32.3 g/dL (ref 30.0–36.0)
MCV: 91.7 fL (ref 78.0–100.0)
Platelets: 258 10*3/uL (ref 150–400)
RBC: 4.36 MIL/uL (ref 3.87–5.11)
RDW: 14 % (ref 11.5–15.5)
WBC: 4.7 10*3/uL (ref 4.0–10.5)

## 2016-04-23 LAB — CREATININE, SERUM
Creatinine, Ser: 0.92 mg/dL (ref 0.44–1.00)
GFR calc Af Amer: >60 mL/min (ref >60)
GFR calc non Af Amer: >60 mL/min (ref >60)

## 2016-04-23 LAB — D-DIMER, QUANTITATIVE (NOT AT ARMC): D DIMER QUANT: 0.52 ug{FEU}/mL — AB (ref 0.00–0.50)

## 2016-04-23 LAB — PROCALCITONIN

## 2016-04-23 MED ORDER — IPRATROPIUM BROMIDE 0.02 % IN SOLN
0.5000 mg | Freq: Once | RESPIRATORY_TRACT | Status: AC
  Administered 2016-04-23: 0.5 mg via RESPIRATORY_TRACT
  Filled 2016-04-23: qty 2.5

## 2016-04-23 MED ORDER — ONDANSETRON HCL 4 MG PO TABS: 4.0000 mg | ORAL_TABLET | Freq: Four times a day (QID) | ORAL | Status: DC | PRN

## 2016-04-23 MED ORDER — AZITHROMYCIN 500 MG IV SOLR
INTRAVENOUS | Status: AC
  Filled 2016-04-23: qty 500

## 2016-04-23 MED ORDER — FUROSEMIDE 10 MG/ML IJ SOLN
40.0000 mg | Freq: Once | INTRAMUSCULAR | Status: AC
  Administered 2016-04-23: 40 mg via INTRAVENOUS
  Filled 2016-04-23: qty 4

## 2016-04-23 MED ORDER — ACETAMINOPHEN 650 MG RE SUPP: 650.0000 mg | Freq: Four times a day (QID) | RECTAL | Status: DC | PRN

## 2016-04-23 MED ORDER — ACETAMINOPHEN 325 MG PO TABS
650.0000 mg | ORAL_TABLET | Freq: Four times a day (QID) | ORAL | Status: DC | PRN
Start: 2016-04-23 — End: 2016-04-26
  Administered 2016-04-24 (×3): 650 mg via ORAL
  Filled 2016-04-23 (×3): qty 2

## 2016-04-23 MED ORDER — IPRATROPIUM-ALBUTEROL 0.5-2.5 (3) MG/3ML IN SOLN
3.0000 mL | Freq: Four times a day (QID) | RESPIRATORY_TRACT | Status: DC
Start: 2016-04-23 — End: 2016-04-24
  Administered 2016-04-23: 3 mL via RESPIRATORY_TRACT
  Filled 2016-04-23: qty 3

## 2016-04-23 MED ORDER — SODIUM CHLORIDE 0.9 % IV SOLN: 250.0000 mL | INTRAVENOUS | Status: DC | PRN

## 2016-04-23 MED ORDER — POLYETHYLENE GLYCOL 3350 17 G PO PACK
17.0000 g | PACK | Freq: Every day | ORAL | Status: DC | PRN
  Administered 2016-04-24: 17 g via ORAL
  Filled 2016-04-23: qty 1

## 2016-04-23 MED ORDER — FUROSEMIDE 10 MG/ML IJ SOLN
20.0000 mg | Freq: Two times a day (BID) | INTRAMUSCULAR | Status: DC
Start: 2016-04-23 — End: 2016-04-24
  Administered 2016-04-23 – 2016-04-24 (×2): 20 mg via INTRAVENOUS
  Filled 2016-04-23 (×2): qty 2

## 2016-04-23 MED ORDER — SODIUM CHLORIDE 0.9% FLUSH
3.0000 mL | Freq: Two times a day (BID) | INTRAVENOUS | Status: DC
  Administered 2016-04-24 – 2016-04-26 (×3): 3 mL via INTRAVENOUS

## 2016-04-23 MED ORDER — ENOXAPARIN SODIUM 40 MG/0.4ML ~~LOC~~ SOLN
40.0000 mg | SUBCUTANEOUS | Status: DC
  Administered 2016-04-23 – 2016-04-25 (×3): 40 mg via SUBCUTANEOUS
  Filled 2016-04-23 (×3): qty 0.4

## 2016-04-23 MED ORDER — DEXTROSE 5 % IV SOLN
1.0000 g | Freq: Once | INTRAVENOUS | Status: AC
  Administered 2016-04-23: 1 g via INTRAVENOUS
  Filled 2016-04-23: qty 10

## 2016-04-23 MED ORDER — ALBUTEROL SULFATE (2.5 MG/3ML) 0.083% IN NEBU
5.0000 mg | INHALATION_SOLUTION | Freq: Once | RESPIRATORY_TRACT | Status: AC
  Administered 2016-04-23: 5 mg via RESPIRATORY_TRACT
  Filled 2016-04-23: qty 6

## 2016-04-23 MED ORDER — AMITRIPTYLINE HCL 50 MG PO TABS
150.0000 mg | ORAL_TABLET | Freq: Every day | ORAL | Status: DC
  Administered 2016-04-23 – 2016-04-24 (×2): 150 mg via ORAL
  Filled 2016-04-23: qty 6
  Filled 2016-04-23 (×2): qty 3
  Filled 2016-04-23: qty 6

## 2016-04-23 MED ORDER — ALBUTEROL (5 MG/ML) CONTINUOUS INHALATION SOLN
20.0000 mg/h | INHALATION_SOLUTION | Freq: Once | RESPIRATORY_TRACT | Status: AC
  Administered 2016-04-23: 20 mg/h via RESPIRATORY_TRACT
  Filled 2016-04-23: qty 20

## 2016-04-23 MED ORDER — DEXTROSE 5 % IV SOLN
500.0000 mg | Freq: Once | INTRAVENOUS | Status: AC
  Administered 2016-04-23: 500 mg via INTRAVENOUS

## 2016-04-23 MED ORDER — IPRATROPIUM-ALBUTEROL 0.5-2.5 (3) MG/3ML IN SOLN: 3.0000 mL | RESPIRATORY_TRACT | Status: DC | PRN

## 2016-04-23 MED ORDER — ACETAMINOPHEN 325 MG PO TABS
650.0000 mg | ORAL_TABLET | Freq: Once | ORAL | Status: AC
  Administered 2016-04-23: 650 mg via ORAL
  Filled 2016-04-23: qty 2

## 2016-04-23 MED ORDER — ONDANSETRON HCL 4 MG/2ML IJ SOLN
4.0000 mg | Freq: Four times a day (QID) | INTRAMUSCULAR | Status: DC | PRN
  Filled 2016-04-23: qty 2

## 2016-04-23 MED ORDER — SODIUM CHLORIDE 0.9% FLUSH: 3.0000 mL | INTRAVENOUS | Status: DC | PRN

## 2016-04-23 MED ORDER — IOPAMIDOL (ISOVUE-370) INJECTION 76%
100.0000 mL | Freq: Once | INTRAVENOUS | Status: AC | PRN
  Administered 2016-04-23: 100 mL via INTRAVENOUS

## 2016-04-23 MED ORDER — ALBUTEROL SULFATE (2.5 MG/3ML) 0.083% IN NEBU
2.5000 mg | INHALATION_SOLUTION | Freq: Four times a day (QID) | RESPIRATORY_TRACT | Status: DC
  Administered 2016-04-23: 2.5 mg via RESPIRATORY_TRACT
  Filled 2016-04-23: qty 3

## 2016-04-23 NOTE — ED Notes (Signed)
ED Provider at bedside. 

## 2016-04-23 NOTE — ED Notes (Signed)
Attempted to call report x 1, no answer at nurses station.

## 2016-04-23 NOTE — ED Notes (Signed)
ED provider at bedside.

## 2016-04-23 NOTE — H&P (Signed)
History and Physical  Melinda Woods ENI:[id number] DOB: 1965/03/28 DOA: 04/23/2016  Referring physician:  Tiffany Kocher, ER physician PCP: Leola Brazil, DO  Outpatient Specialists: None Patient coming from: Home & is able to ambulate without assistance, but because of her peripheral neuropathy, she does not ambulate too much at all Chief Complaint: Shortness of breath   HPI: Melinda Woods is a 51 y.o. female with medical history significant for morbid obesity and peripheral neuropathy who for the last few weeks started noticing increasing dyspnea with exertion and a nonproductive cough.  She went to the emergency room at that time on 2/16 and had a normal chest x-ray and labs was given an albuterol inhaler on discharge and felt better. Patient does not smoke or has a previous history of bronchitis or asthma. She came into the back to emergency room when symptoms get worse over the last few days, Patient's breathing got tighter and tighter to the point where she could barely talk.  ED Course: In the emergency room, patient noted to have normal lab work.  Repeat chest x-ray noted some mild interstitial edema, but otherwise unrevealing. A BNP will came back only at 8. A d-dimer was elevated at 0.5 to Cipro and underwent CT scan of the chest.  This noted more interstitial pulmonary edema questioning early CHF versus noncardiac etiology such as allergic type response. A widespread atypical infectious pneumonia was also possibility and patient was noted to have some patchy atelectasis. With these findings, hospitalists were called for further evaluation. I requested a dose of Lasix be given which patient states that she urinated quite a bit afterwards and actually felt her breathing was a little bit better.  Review of Systems: Patient seen after transfer to the floor . Pt complains of shortness of breath which is tight. He was likely she can barely get her words out. She complains of a cough which  is mostly nonproductive, but when she does it's whitish with occasional little bit of blood seen. Her overall chest hurts from trying to breathe. She also complains of lower extremity numbness from peripheral neuropathy  Pt denies any headache, vision changes, dysphagia, palpitations, wheeze, abdominal pain, hematuria, dysuria, constipation, diarrhea, focal extremity weakness or pain. .  Review of systems are otherwise negative   Past Medical History:  Diagnosis Date  . Anxiety   . Hypertension   . Neuropathy (HCC)   . Ovarian cyst    Past Surgical History:  Procedure Laterality Date  . TUBAL LIGATION      Social History:  reports that she has never smoked. She has never used smokeless tobacco. She reports that she does not drink alcohol or use drugs. Patient lives at home with her husband.   Allergies  Allergen Reactions  . Oxycodone Base Hives  . Latex Rash    Family history: Hypertension, diabetes  Prior to Admission medications   Medication Sig Start Date End Date Taking? Authorizing Provider  albuterol (PROVENTIL HFA;VENTOLIN HFA) 108 (90 Base) MCG/ACT inhaler Inhale into the lungs every 6 (six) hours as needed for wheezing or shortness of breath.   Yes Historical Provider, MD  amitriptyline (ELAVIL) 100 MG tablet Take 100 mg by mouth 3 (three) times daily. 04/21/16  Yes Historical Provider, MD    Physical Exam: BP 107/84 (BP Location: Right Wrist)   Pulse (!) 109   Temp 98.3 F (36.8 C) (Oral)   Resp 20   Ht 5\' 6"  (1.676 m)   Wt 114.9 kg (253 lb  6.4 oz)   LMP 03/29/2016   SpO2 90%   BMI 40.90 kg/m   General:  Alert and oriented 3, moderate distress secondary to dyspnea  Eyes: Sclera nonicteric, tracking movements are intact  ENT: Normocephalic major medical and mucous membranes are slightly dry  Neck: Supple, no JVD  Cardiovascular: Regular rhythm, borderline tachycardia  Respiratory: Decreased breath sounds throughout secondary to body habitus, moderate  inspiratory effort , no crackles or wheezes Abdomen: Soft, obese, nontender, positive bowel sounds  Skin: No skin breaks, tears or lesions  Musculoskeletal: No clubbing or cyanosis or edema  Psychiatric: Patient is appropriate, no evidence of psychoses  Neurologic: No focal deficits other than decreased sensation in the lower extremities from about ankle down           Labs on Admission:  Basic Metabolic Panel:  Recent Labs Lab 04/23/16 0903 04/23/16 1422  NA 138  --   K 4.3  --   CL 102  --   CO2 28  --   GLUCOSE 97  --   BUN 8  --   CREATININE 0.94 0.92  CALCIUM 9.3  --    Liver Function Tests:  Recent Labs Lab 04/23/16 0903  AST 31  ALT 27  ALKPHOS 75  BILITOT 0.5  PROT 8.6*  ALBUMIN 3.7   No results for input(s): LIPASE, AMYLASE in the last 168 hours. No results for input(s): AMMONIA in the last 168 hours. CBC:  Recent Labs Lab 04/23/16 0903  WBC 4.7  HGB 12.9  HCT 40.0  MCV 91.7  PLT 258   Cardiac Enzymes:  Recent Labs Lab 04/23/16 0903  TROPONINI <0.03    BNP (last 3 results)  Recent Labs  04/23/16 0903  BNP 8.0    ProBNP (last 3 results) No results for input(s): PROBNP in the last 8760 hours.  CBG: No results for input(s): GLUCAP in the last 168 hours.  Radiological Exams on Admission: Ct Angio Chest Pe W/cm &/or Wo Cm  Result Date: 04/23/2016 CLINICAL DATA:  Shortness of breath and wheezing EXAM: CT ANGIOGRAPHY CHEST WITH CONTRAST TECHNIQUE: Multidetector CT imaging of the chest was performed using the standard protocol during bolus administration of intravenous contrast. Multiplanar CT image reconstructions and MIPs were obtained to evaluate the vascular anatomy. CONTRAST:  100 mL Isovue 370 nonionic COMPARISON:  Chest radiograph April 23, 2016 extent FINDINGS: Cardiovascular: There is no demonstrable pulmonary embolus. No thoracic aortic aneurysm or dissection. The visualized great vessels appear normal. Pericardium is not  appreciably thickened. Mediastinum/Nodes: Thyroid appears unremarkable. There is no appreciable thoracic adenopathy. Lungs/Pleura: There is prominence of the interstitium diffusely, felt to represent a degree of edema. There is patchy atelectatic change in the lower lobe regions. There is no frank airspace consolidation. No pleural effusion evident. Upper Abdomen: In the visualized upper abdomen, there appears to be a degree of hepatic steatosis. Visualized upper abdomen otherwise appears unremarkable. Musculoskeletal: There are no blastic or lytic bone lesions. There is degenerative change in the lower thoracic region. A prominent Schmorl's node is noted in a lower thoracic vertebral body. Review of the MIP images confirms the above findings. IMPRESSION: No evident pulmonary embolus. Somewhat generalized interstitial prominence suggesting a degree of interstitial pulmonary edema. Question early congestive heart failure versus noncardiogenic etiology. An allergic type response causing diffuse changes of this nature and is a differential consideration. Widespread atypical infectious pneumonia also is a possibility. No airspace consolidation. Patchy atelectasis is noted in the lower lobes. No appreciable adenopathy.  Evidence of hepatic steatosis. Electronically Signed   By: Bretta Bang III M.D.   On: 04/23/2016 10:25   Dg Chest Port 1 View  Result Date: 04/23/2016 CLINICAL DATA:  Shortness of breath and wheezing EXAM: PORTABLE CHEST 1 VIEW COMPARISON:  April 13, 2016 FINDINGS: There is mild interstitial edema. No airspace consolidation. Heart is upper normal in size with pulmonary vascularity within normal limits. No adenopathy. No bone lesions. Trachea appears normal. IMPRESSION: Mild interstitial edema. No airspace consolidation. Stable cardiac silhouette. Question early changes of congestive heart failure. Electronically Signed   By: Bretta Bang III M.D.   On: 04/23/2016 09:15    EKG:  Independently reviewed. Incomplete although what is visible looks like normal sinus rhythm   Assessment/Plan Present on Admission: . Dyspnea: I am not sure if all of her breathing issues can be treated just to heart failure. She diuresed almost a liter and a half with Lasix 40 mg IV. Blood gas pending. If this notes significant hypoxia, given abnormality seen on CT scan, will consult pulmonary.  . Hypertension: This is noted in her medical history although she is not on any medications.  . Acute on chronic diastolic heart failure [facility]): Patient seems to have really responded to Lasix. Given that her last echo was 2 years ago, we'll repeat. If heart failure confirmed, we'll consider starting ACE inhibitor.  . Morbid obesity [facility]): Patient meets criteria with BMI greater than 40  Peripheral neuropathy: Patient states that she is on amitriptyline 100 3 times a day. Maximum dose for neuropathy is 150 daily at bedtime. We'll change to this dose. This would not affect her breathing well. Principal Problem:   Dyspnea Active Problems:   Hypertension   Acute on chronic diastolic heart failure (HCC)   Morbid obesity (HCC)   Peripheral neuropathy (HCC)   DVT prophylaxis: Lovenox   Code Status: Full code   Family Communication: Left message for husband   Disposition Plan: Patient will be for several days and to improve her breathing and also further diurese her  Consults called: None   Admission status: initially I place the patient under observation as initial report given to me by ER physician noted that patient had minimal hypoxia. In person, she is much more dyspneic and now that we have given her Lasix and she has diuresed significantly, we'll change admission to full inpatient due to CHF    Hollice Espy MD [facility] Pager [phone]  If 7PM-7AM, please contact night-coverage [email or web address] Password [facility]  04/23/2016, 3:51 PM

## 2016-04-23 NOTE — ED Triage Notes (Signed)
Pt arrives extremely SOB, wheezing noted, unable to speak. Used inhaler without relief.

## 2016-04-23 NOTE — ED Provider Notes (Signed)
[facility] Provider Note   CSN: [id number] Arrival date & time: 04/23/16  3845     History   Chief Complaint Chief Complaint  Patient presents with  . Shortness of Breath    HPI Melinda Woods is a 51 y.o. female.  HPI  51 year old female presents with severe shortness of breath. History is limited as she speaks only if couple word sentences. Patient states this started yesterday. It has progressively worsened since onset. She's having cough with white sputum with occasional blood. Denies fevers or nasal congestion. Her chest hurts when coughing in her anterior chest. There is severe shortness of breath. Tried an albuterol inhaler she was given 10 days ago with no relief. Denies leg swelling or calf swelling. Denies smoking history. No known history of bronchitis, asthma, or COPD.  Past Medical History:  Diagnosis Date  . Anxiety   . Hypertension   . Neuropathy (HCC)   . Ovarian cyst     Patient Active Problem List   Diagnosis Date Noted  . Dyspnea 04/23/2016  . Right sided weakness 03/13/2014  . TIA (transient ischemic attack) 03/13/2014    Past Surgical History:  Procedure Laterality Date  . TUBAL LIGATION      OB History    No data available       Home Medications    Prior to Admission medications   Medication Sig Start Date End Date Taking? Authorizing Provider  albuterol (PROVENTIL HFA;VENTOLIN HFA) 108 (90 Base) MCG/ACT inhaler Inhale into the lungs every 6 (six) hours as needed for wheezing or shortness of breath.   Yes Historical Provider, MD  AMITRIPTYLINE HCL PO Take by mouth.    Historical Provider, MD    Family History No family history on file.  Social History Social History  Substance Use Topics  . Smoking status: Never Smoker  . Smokeless tobacco: Never Used  . Alcohol use No     Allergies   Oxycodone base   Review of Systems Review of Systems  Constitutional: Negative for fever.  HENT: Negative for congestion.     Respiratory: Positive for cough and shortness of breath.   Cardiovascular: Positive for chest pain. Negative for leg swelling.  All other systems reviewed and are negative.    Physical Exam Updated Vital Signs BP 131/77   Pulse 109   Temp 98.1 F (36.7 C) (Oral)   Resp 16   LMP 03/29/2016   SpO2 97%   Physical Exam  Constitutional: She is oriented to person, place, and time. She appears well-developed and well-nourished.  obese  HENT:  Head: Normocephalic and atraumatic.  Right Ear: External ear normal.  Left Ear: External ear normal.  Nose: Nose normal.  Eyes: Right eye exhibits no discharge. Left eye exhibits no discharge.  Cardiovascular: Regular rhythm and normal heart sounds.  Tachycardia present.   Pulmonary/Chest: Effort normal. Tachypnea noted. She has decreased breath sounds (on expiration diffusely, worst at bases).  Good air movement in, decreasing at bases. Audible wheezing through mouth but not on auscultation  Abdominal: Soft. There is no tenderness.  Musculoskeletal: She exhibits no edema.  Neurological: She is alert and oriented to person, place, and time.  Skin: Skin is warm and dry. She is not diaphoretic.  Nursing note and vitals reviewed.    ED Treatments / Results  Labs (all labs ordered are listed, but only abnormal results are displayed) Labs Reviewed  COMPREHENSIVE METABOLIC PANEL - Abnormal; Notable for the following:  Result Value   Total Protein 8.6 (*)    All other components within normal limits  D-DIMER, QUANTITATIVE (NOT AT [facility]) - Abnormal; Notable for the following:    D-Dimer, Quant 0.52 (*)    All other components within normal limits  CULTURE, BLOOD (ROUTINE X 2)  CULTURE, BLOOD (ROUTINE X 2)  CBC  TROPONIN I  BRAIN NATRIURETIC PEPTIDE    EKG  EKG Interpretation  Date/Time:  Monday April 23 2016 09:08:13 EST Ventricular Rate:  102 PR Interval:    QRS Duration: 87 QT Interval:  349 QTC Calculation: 455 R  Axis:   41 Text Interpretation:  Sinus tachycardia Low voltage, precordial leads Baseline wander in lead(s) V6 no significant change since Apr 13 2016 Confirmed by Criss Alvine MD, Jari Dipasquale [phone]) on 04/23/2016 9:11:42 AM Also confirmed by Criss Alvine MD, Jayleena Stille [phone]), editor Stout CT, Jola Babinski [phone])  on 04/23/2016 9:37:48 AM       Radiology Ct Angio Chest Pe W/cm &/or Wo Cm  Result Date: 04/23/2016 CLINICAL DATA:  Shortness of breath and wheezing EXAM: CT ANGIOGRAPHY CHEST WITH CONTRAST TECHNIQUE: Multidetector CT imaging of the chest was performed using the standard protocol during bolus administration of intravenous contrast. Multiplanar CT image reconstructions and MIPs were obtained to evaluate the vascular anatomy. CONTRAST:  100 mL Isovue 370 nonionic COMPARISON:  Chest radiograph April 23, 2016 extent FINDINGS: Cardiovascular: There is no demonstrable pulmonary embolus. No thoracic aortic aneurysm or dissection. The visualized great vessels appear normal. Pericardium is not appreciably thickened. Mediastinum/Nodes: Thyroid appears unremarkable. There is no appreciable thoracic adenopathy. Lungs/Pleura: There is prominence of the interstitium diffusely, felt to represent a degree of edema. There is patchy atelectatic change in the lower lobe regions. There is no frank airspace consolidation. No pleural effusion evident. Upper Abdomen: In the visualized upper abdomen, there appears to be a degree of hepatic steatosis. Visualized upper abdomen otherwise appears unremarkable. Musculoskeletal: There are no blastic or lytic bone lesions. There is degenerative change in the lower thoracic region. A prominent Schmorl's node is noted in a lower thoracic vertebral body. Review of the MIP images confirms the above findings. IMPRESSION: No evident pulmonary embolus. Somewhat generalized interstitial prominence suggesting a degree of interstitial pulmonary edema. Question early congestive heart failure versus  noncardiogenic etiology. An allergic type response causing diffuse changes of this nature and is a differential consideration. Widespread atypical infectious pneumonia also is a possibility. No airspace consolidation. Patchy atelectasis is noted in the lower lobes. No appreciable adenopathy. Evidence of hepatic steatosis. Electronically Signed   By: Bretta Bang III M.D.   On: 04/23/2016 10:25   Dg Chest Port 1 View  Result Date: 04/23/2016 CLINICAL DATA:  Shortness of breath and wheezing EXAM: PORTABLE CHEST 1 VIEW COMPARISON:  April 13, 2016 FINDINGS: There is mild interstitial edema. No airspace consolidation. Heart is upper normal in size with pulmonary vascularity within normal limits. No adenopathy. No bone lesions. Trachea appears normal. IMPRESSION: Mild interstitial edema. No airspace consolidation. Stable cardiac silhouette. Question early changes of congestive heart failure. Electronically Signed   By: Bretta Bang III M.D.   On: 04/23/2016 09:15    Procedures Procedures (including critical care time)  Medications Ordered in ED Medications  azithromycin (ZITHROMAX) 500 mg in dextrose 5 % 250 mL IVPB (not administered)  albuterol (PROVENTIL) (2.5 MG/3ML) 0.083% nebulizer solution 2.5 mg (not administered)  azithromycin (ZITHROMAX) 500 MG injection (not administered)  albuterol (PROVENTIL,VENTOLIN) solution continuous neb (20 mg/hr Nebulization Given 04/23/16 0900)  ipratropium (  ATROVENT) nebulizer solution 0.5 mg (0.5 mg Nebulization Given 04/23/16 0900)  iopamidol (ISOVUE-370) 76 % injection 100 mL (100 mLs Intravenous Contrast Given 04/23/16 1004)  cefTRIAXone (ROCEPHIN) 1 g in dextrose 5 % 50 mL IVPB (1 g Intravenous New Bag/Given 04/23/16 1112)  acetaminophen (TYLENOL) tablet 650 mg (650 mg Oral Given 04/23/16 1103)  furosemide (LASIX) injection 40 mg (40 mg Intravenous Given 04/23/16 1125)  albuterol (PROVENTIL) (2.5 MG/3ML) 0.083% nebulizer solution 5 mg (5 mg Nebulization  Given 04/23/16 1139)     Initial Impression / Assessment and Plan / ED Course  I have reviewed the triage vital signs and the nursing notes.  Pertinent labs & imaging results that were available during my care of the patient were reviewed by me and considered in my medical decision making (see chart for details).  Clinical Course as of Apr 23 1205  Mon Apr 23, 2016  0907 Given degree of dyspnea she has been placed on continuous albuterol. Had bronchitis 10 days ago but denies previous bronchospasm. Given the intermittent hemoptysis will check ddimer though this is probably infectious.  [SG]  [id number] Patient is breathing easier and feels better. Given mildly elevated d-dimer with hemoptysis, will get CT scan which will help better define pulmonary edema versus hypoventilation, rule out pneumonia, and rule out PE.  [SG]  [phone] Air movement is better as well. Some mild wheezing noted.  [SG]  1054 Patient is still significantly dyspneic. Her respiratory rate is in the mid 20s. She has no longer hypoxic but still tachycardic and still working to breathe. Air movement does seem better however. Given concern for possible pulmonary edema versus atypical pneumonia and still being clinically ill, will admit for further workup and care. She currently wants to take a break on further treatments but will still need further treatments.  [SG]  1118 D/w Dr Rito Ehrlich, accepts to [facility]. Med surg obs. Asks for dose of IV lasix to see if it improves symptoms.  [SG]  1120 Sats dropped to 91% on room air, placed back on 2 L O2  [SG]    Clinical Course User Index [SG] Pricilla Loveless, MD    Final Clinical Impressions(s) / ED Diagnoses   Final diagnoses:  Hypoxia    New Prescriptions New Prescriptions   No medications on file     Pricilla Loveless, MD 04/23/16 1207

## 2016-04-23 NOTE — ED Notes (Signed)
Attempted to call report x 2 no answer at nurses station.

## 2016-04-24 ENCOUNTER — Inpatient Hospital Stay (HOSPITAL_COMMUNITY): Payer: 59

## 2016-04-24 DIAGNOSIS — I509 Heart failure, unspecified: Secondary | ICD-10-CM

## 2016-04-24 DIAGNOSIS — R0600 Dyspnea, unspecified: Secondary | ICD-10-CM

## 2016-04-24 LAB — BASIC METABOLIC PANEL
Anion gap: 10 (ref 5–15)
BUN: 12 mg/dL (ref 6–20)
CALCIUM: 9.2 mg/dL (ref 8.9–10.3)
CO2: 27 mmol/L (ref 22–32)
CREATININE: 0.88 mg/dL (ref 0.44–1.00)
Chloride: 100 mmol/L — ABNORMAL LOW (ref 101–111)
GFR calc non Af Amer: >60 mL/min (ref >60)
GLUCOSE: 111 mg/dL — AB (ref 65–99)
Potassium: 4 mmol/L (ref 3.5–5.1)
Sodium: 137 mmol/L (ref 135–145)

## 2016-04-24 LAB — CBC
HCT: 38.1 % (ref 36.0–46.0)
Hemoglobin: 12.4 g/dL (ref 12.0–15.0)
MCH: 29.6 pg (ref 26.0–34.0)
MCHC: 32.5 g/dL (ref 30.0–36.0)
MCV: 90.9 fL (ref 78.0–100.0)
PLATELETS: 253 10*3/uL (ref 150–400)
RBC: 4.19 MIL/uL (ref 3.87–5.11)
RDW: 14.4 % (ref 11.5–15.5)
WBC: 6.1 10*3/uL (ref 4.0–10.5)

## 2016-04-24 LAB — ECHOCARDIOGRAM COMPLETE
Height: 66 in
WEIGHTICAEL: 3728 [oz_av]

## 2016-04-24 MED ORDER — IPRATROPIUM-ALBUTEROL 0.5-2.5 (3) MG/3ML IN SOLN
3.0000 mL | Freq: Three times a day (TID) | RESPIRATORY_TRACT | Status: DC
  Administered 2016-04-24 – 2016-04-25 (×4): 3 mL via RESPIRATORY_TRACT
  Filled 2016-04-24 (×4): qty 3

## 2016-04-24 MED ORDER — IBUPROFEN 200 MG PO TABS
600.0000 mg | ORAL_TABLET | Freq: Four times a day (QID) | ORAL | Status: DC | PRN
  Administered 2016-04-24: 600 mg via ORAL
  Filled 2016-04-24: qty 3

## 2016-04-24 MED ORDER — ATORVASTATIN CALCIUM 40 MG PO TABS
40.0000 mg | ORAL_TABLET | Freq: Every day | ORAL | Status: DC
  Administered 2016-04-24 – 2016-04-25 (×2): 40 mg via ORAL
  Filled 2016-04-24 (×2): qty 1

## 2016-04-24 MED ORDER — METHYLPREDNISOLONE SODIUM SUCC 125 MG IJ SOLR
60.0000 mg | Freq: Two times a day (BID) | INTRAMUSCULAR | Status: DC
  Administered 2016-04-24 – 2016-04-25 (×3): 60 mg via INTRAVENOUS
  Filled 2016-04-24 (×3): qty 2

## 2016-04-24 MED ORDER — FUROSEMIDE 10 MG/ML IJ SOLN
40.0000 mg | Freq: Two times a day (BID) | INTRAMUSCULAR | Status: DC
  Administered 2016-04-24 – 2016-04-26 (×4): 40 mg via INTRAVENOUS
  Filled 2016-04-24 (×4): qty 4

## 2016-04-24 NOTE — Progress Notes (Signed)
  Echocardiogram 2D Echocardiogram has been performed.  Melinda Woods 04/24/2016, 1:38 PM

## 2016-04-24 NOTE — Progress Notes (Signed)
PROGRESS NOTE    Melinda Woods  RWE:[id number] DOB: 09/26/1965 DOA: 04/23/2016 PCP: Leola Brazil, DO    Brief Narrative: Melinda Woods is a 51 y.o. female with a history of diastolic CHF, morbid obesity and peripheral neuropathy. She presented with dyspnea and found to have acute heart failure.   Assessment & Plan:   Principal Problem:   Dyspnea Active Problems:   Hypertension   Acute on chronic diastolic heart failure (HCC)   Morbid obesity (HCC)   Peripheral neuropathy (HCC)   Acute diastolic heart failure (HCC)   Dyspnea Leading diagnosis of CHF exacerbation. CTA suggests this but also suggests possible other etiologies. Patient does have wheezing on exam this morning. Afebrile. I feel like this is less likely infectious but unsure. Likely a component of bronchospasm, however. -start methylprednisolone -continue Duoneb -continue ceftriaxone/azithromycin  Acute on chronic diastolic heart failure Good diuresis for half the day yesterday. Dyspnea improved somewhat. No chest pain. Weight is down 9 lbs, however, these were taken on two different beds. -increase to lasix 40mg  BID -daily weights -strict in/out -echocardiogram -chest x-ray  Hyperlipidemia -Start atorvastatin  Neuropathy -Continue amitriptyline   Essential hypertension Stable and controlled. Not on medications.  Morbid obesity Possibly playing a role. Could be a component of pulmonary artery hypertension. Echo should shed more light.  Right sided upper abdominal pain New since yesterday. Hepatic steatosis seen on CTA. ?constipation. -abdominal x-ray -if negative x-ray, will get ultrasound  DVT prophylaxis: Lovenox Code Status: Full code Family Communication: None at bedside Disposition Plan: Discharge home pending medical improvement   Consultants:   None  Procedures:  None  Antimicrobials:  Ceftriaxone  Azithromycin    Subjective: Patient reports new right sided abdominal  pain in the upper quadrant that is constant and does not radiate. She reports leaning on her right side helping with symptoms. Also, passing gas helps. It has worsened since yesterday.  Objective: Vitals:   04/23/16 1347 04/23/16 2004 04/23/16 2137 04/24/16 0545  BP:   119/83 118/87  Pulse:   (!) 114 96  Resp:   19 18  Temp:   98.5 F (36.9 C) 98.3 F (36.8 C)  TempSrc:   Oral Oral  SpO2: 90% 96% 98% 99%  Weight:    105.7 kg (233 lb)  Height:        Intake/Output Summary (Last 24 hours) at 04/24/16 0754 Last data filed at 04/23/16 2239  Gross per 24 hour  Intake              410 ml  Output             2300 ml  Net            -1890 ml   Filed Weights   04/23/16 1327 04/24/16 0545  Weight: 114.9 kg (253 lb 6.4 oz) 105.7 kg (233 lb)    Examination:  General exam: Appears calm and comfortable Respiratory system: bilateral crackles that are worse on the right with scattered wheezing and normal respiratory effort Cardiovascular system: S1 & S2 heard, RRR. No murmurs Gastrointestinal system: Abdomen is nondistended, soft and tender in RUQ. Normal bowel sounds heard. Central nervous system: Alert and oriented. No focal neurological deficits. Extremities: No edema. No calf tenderness Skin: No cyanosis. No rashes Psychiatry: Judgement and insight appear normal. Mood & affect appropriate.     Data Reviewed: I have personally reviewed following labs and imaging studies  CBC:  Recent Labs Lab 04/23/16 0903 04/24/16 0345  WBC 4.7  6.1  HGB 12.9 12.4  HCT 40.0 38.1  MCV 91.7 90.9  PLT 258 253   Basic Metabolic Panel:  Recent Labs Lab 04/23/16 0903 04/23/16 1422 04/24/16 0345  NA 138  --  137  K 4.3  --  4.0  CL 102  --  100*  CO2 28  --  27  GLUCOSE 97  --  111*  BUN 8  --  12  CREATININE 0.94 0.92 0.88  CALCIUM 9.3  --  9.2   GFR: Estimated Creatinine Clearance: 95.1 mL/min (by C-G formula based on SCr of 0.88 mg/dL). Liver Function Tests:  Recent  Labs Lab 04/23/16 0903  AST 31  ALT 27  ALKPHOS 75  BILITOT 0.5  PROT 8.6*  ALBUMIN 3.7   No results for input(s): LIPASE, AMYLASE in the last 168 hours. No results for input(s): AMMONIA in the last 168 hours. Coagulation Profile: No results for input(s): INR, PROTIME in the last 168 hours. Cardiac Enzymes:  Recent Labs Lab 04/23/16 0903  TROPONINI <0.03   BNP (last 3 results) No results for input(s): PROBNP in the last 8760 hours. HbA1C: No results for input(s): HGBA1C in the last 72 hours. CBG: No results for input(s): GLUCAP in the last 168 hours. Lipid Profile: No results for input(s): CHOL, HDL, LDLCALC, TRIG, CHOLHDL, LDLDIRECT in the last 72 hours. Thyroid Function Tests: No results for input(s): TSH, T4TOTAL, FREET4, T3FREE, THYROIDAB in the last 72 hours. Anemia Panel: No results for input(s): VITAMINB12, FOLATE, FERRITIN, TIBC, IRON, RETICCTPCT in the last 72 hours. Sepsis Labs:  Recent Labs Lab 04/23/16 1422  PROCALCITON <0.10    No results found for this or any previous visit (from the past 240 hour(s)).       Radiology Studies: Ct Angio Chest Pe W/cm &/or Wo Cm  Result Date: 04/23/2016 CLINICAL DATA:  Shortness of breath and wheezing EXAM: CT ANGIOGRAPHY CHEST WITH CONTRAST TECHNIQUE: Multidetector CT imaging of the chest was performed using the standard protocol during bolus administration of intravenous contrast. Multiplanar CT image reconstructions and MIPs were obtained to evaluate the vascular anatomy. CONTRAST:  100 mL Isovue 370 nonionic COMPARISON:  Chest radiograph April 23, 2016 extent FINDINGS: Cardiovascular: There is no demonstrable pulmonary embolus. No thoracic aortic aneurysm or dissection. The visualized great vessels appear normal. Pericardium is not appreciably thickened. Mediastinum/Nodes: Thyroid appears unremarkable. There is no appreciable thoracic adenopathy. Lungs/Pleura: There is prominence of the interstitium diffusely, felt  to represent a degree of edema. There is patchy atelectatic change in the lower lobe regions. There is no frank airspace consolidation. No pleural effusion evident. Upper Abdomen: In the visualized upper abdomen, there appears to be a degree of hepatic steatosis. Visualized upper abdomen otherwise appears unremarkable. Musculoskeletal: There are no blastic or lytic bone lesions. There is degenerative change in the lower thoracic region. A prominent Schmorl's node is noted in a lower thoracic vertebral body. Review of the MIP images confirms the above findings. IMPRESSION: No evident pulmonary embolus. Somewhat generalized interstitial prominence suggesting a degree of interstitial pulmonary edema. Question early congestive heart failure versus noncardiogenic etiology. An allergic type response causing diffuse changes of this nature and is a differential consideration. Widespread atypical infectious pneumonia also is a possibility. No airspace consolidation. Patchy atelectasis is noted in the lower lobes. No appreciable adenopathy. Evidence of hepatic steatosis. Electronically Signed   By: Bretta Bang III M.D.   On: 04/23/2016 10:25   Dg Chest Port 1 View  Result Date: 04/23/2016 CLINICAL  DATA:  Shortness of breath and wheezing EXAM: PORTABLE CHEST 1 VIEW COMPARISON:  April 13, 2016 FINDINGS: There is mild interstitial edema. No airspace consolidation. Heart is upper normal in size with pulmonary vascularity within normal limits. No adenopathy. No bone lesions. Trachea appears normal. IMPRESSION: Mild interstitial edema. No airspace consolidation. Stable cardiac silhouette. Question early changes of congestive heart failure. Electronically Signed   By: Bretta Bang III M.D.   On: 04/23/2016 09:15        Scheduled Meds: . amitriptyline  150 mg Oral QHS  . enoxaparin (LOVENOX) injection  40 mg Subcutaneous Q24H  . furosemide  20 mg Intravenous Q12H  . ipratropium-albuterol  3 mL  Nebulization TID  . sodium chloride flush  3 mL Intravenous Q12H   Continuous Infusions:   LOS: 1 day     Jacquelin Hawking, MD [facility] 04/24/2016, 7:54 AM Pager: [phone]  If 7PM-7AM, please contact night-coverage [email or web address] Password [facility] 04/24/2016, 7:54 AM

## 2016-04-25 DIAGNOSIS — R0602 Shortness of breath: Secondary | ICD-10-CM

## 2016-04-25 DIAGNOSIS — G629 Polyneuropathy, unspecified: Secondary | ICD-10-CM

## 2016-04-25 DIAGNOSIS — K760 Fatty (change of) liver, not elsewhere classified: Secondary | ICD-10-CM

## 2016-04-25 LAB — BASIC METABOLIC PANEL
ANION GAP: 8 (ref 5–15)
BUN: 19 mg/dL (ref 6–20)
CHLORIDE: 102 mmol/L (ref 101–111)
CO2: 25 mmol/L (ref 22–32)
Calcium: 9.8 mg/dL (ref 8.9–10.3)
Creatinine, Ser: 0.87 mg/dL (ref 0.44–1.00)
Glucose, Bld: 140 mg/dL — ABNORMAL HIGH (ref 65–99)
POTASSIUM: 4.7 mmol/L (ref 3.5–5.1)
SODIUM: 135 mmol/L (ref 135–145)

## 2016-04-25 LAB — HIV ANTIBODY (ROUTINE TESTING W REFLEX): HIV Screen 4th Generation wRfx: NONREACTIVE

## 2016-04-25 LAB — PROCALCITONIN

## 2016-04-25 MED ORDER — AMITRIPTYLINE HCL 25 MG PO TABS
150.0000 mg | ORAL_TABLET | Freq: Two times a day (BID) | ORAL | Status: DC
  Administered 2016-04-25 – 2016-04-26 (×3): 150 mg via ORAL
  Filled 2016-04-25: qty 6

## 2016-04-25 NOTE — Evaluation (Signed)
Physical Therapy Evaluation-1x Patient Details Name: Melinda Woods MRN: [id number] DOB: 02/17/1966 Today's Date: 04/25/2016   History of Present Illness  pt was admitted with dyspnea.  Found to have diastolic CHF.  H/O morbid obesity and peripheral neuropathy.    Clinical Impression  On eval, pt was supervision level assist for mobility. She walked ~65 feet with a RW. Pt rated pain in bil feet to be a 8/10. Ambulation distance limited by pain. O2 sats 96% on RA, HR 136 bpm during ambulation. Pt denied need to practiced stair negotiation. Do not anticipate any follow up PT needs at discharge. 1x eval.     Follow Up Recommendations No PT follow up;Supervision for mobility/OOB    Equipment Recommendations  Rolling walker with 5" wheels    Recommendations for Other Services       Precautions / Restrictions Precautions Precautions: Fall Precaution Comments: pt has peripheral neuropathy Restrictions Weight Bearing Restrictions: No      Mobility  Bed Mobility Overal bed mobility: Modified Independent             General bed mobility comments: oob in recliner  Transfers Overall transfer level: Needs assistance Equipment used: Rolling walker (2 wheeled) Transfers: Sit to/from Stand Sit to Stand: Supervision         General transfer comment: for safety.   Ambulation/Gait Ambulation/Gait assistance: Supervision Ambulation Distance (Feet): 65 Feet Assistive device: Rolling walker (2 wheeled) Gait Pattern/deviations: Step-through pattern;Decreased stride length     General Gait Details: slow but steady gait speed. RW aides with pain control.   Stairs            Wheelchair Mobility    Modified Rankin (Stroke Patients Only)       Balance Overall balance assessment: Needs assistance             Standing balance comment: needs RW for mobility for safety                             Pertinent Vitals/Pain Pain Assessment: 0-10 Pain Score: 8   Faces Pain Scale: Hurts little more Pain Location: bil feet with activity Pain Descriptors / Indicators: Sore Pain Intervention(s): Monitored during session;Limited activity within patient's tolerance    Home Living Family/patient expects to be discharged to:: Private residence Living Arrangements: Spouse/significant other Available Help at Discharge: Family           Home Equipment: Grab bars - tub/shower Additional Comments: pt reports that she watches 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    Prior Function Level of Independence: Independent               Hand Dominance        Extremity/Trunk Assessment   Upper Extremity Assessment Upper Extremity Assessment: Defer to OT evaluation    Lower Extremity Assessment Lower Extremity Assessment: Generalized weakness;LLE deficits/detail;RLE deficits/detail RLE Sensation: history of peripheral neuropathy LLE Sensation: history of peripheral neuropathy    Cervical / Trunk Assessment Cervical / Trunk Assessment: Normal  Communication   Communication: No difficulties  Cognition Arousal/Alertness: Awake/alert Behavior During Therapy: WFL for tasks assessed/performed Overall Cognitive Status: Within Functional Limits for tasks assessed                      General Comments      Exercises     Assessment/Plan    PT Assessment Patient needs continued PT services  PT Problem  List Decreased mobility;Pain;Decreased knowledge of use of DME       PT Treatment Interventions Gait training;DME instruction;Therapeutic exercise;Patient/family education;Therapeutic activities;Functional mobility training    PT Goals (Current goals can be found in the Care Plan section)  Acute Rehab PT Goals Patient Stated Goal: home, hopefully today PT Goal Formulation: With patient Time For Goal Achievement: 05/09/16 Potential to Achieve Goals: Good    Frequency Min 3X/week   Barriers to discharge         Co-evaluation               End of Session   Activity Tolerance: Patient limited by pain Patient left: in chair;with call bell/phone within reach   PT Visit Diagnosis: Difficulty in walking, not elsewhere classified (R26.2)         Time: 9604-5409 PT Time Calculation (min) (ACUTE ONLY): 8 min   Charges:   PT Evaluation $PT Eval Low Complexity: 1 Procedure     PT G Codes:         Rebeca Alert, MPT Pager: [phone]

## 2016-04-25 NOTE — Progress Notes (Addendum)
PROGRESS NOTE    Melinda Woods   [id number]:[id number]  DOB: 07/25/1965  DOA: 04/23/2016 PCP: Leola Brazil, DO   Brief Narrative:  Brief Summary:  [name] is a 51 y.o. female with medical history significant for morbid obesity and peripheral neuropathy who for the last few weeks started noticing increasing dyspnea with exertion and a nonproductive cough.  She went to the emergency room at that time on 2/16 and had a normal chest x-ray and labs was given an albuterol inhaler on discharge and felt better. Patient does not smoke or has a previous history of bronchitis or asthma. She came into the back to emergency room when symptoms get worse over the last few days, Patient's breathing got tighter and tighter to the point where she could barely talk. CT of the chest suggestive of pulmonary edema vs atypical infection. She states her family member had recently told her that she was gaining "fluid weight". Her feet were newly swollen.    Subjective: Feels that her breathing is much better today but still dyspneic with exertion. Feet no longer swollen.    Hospital Course:  Acute resp failure   (A) ? D CHF - BNP not high likely due to obesity - ECHO shows grade 1 dCHF - has diuresed and in negative balance by 4.5 L- pulse ox  93% on room air- weight 253 >> 233 - also has some atelectasis due to being non ambulatory - start IS - we do need to continue IV Lasix for another 24 hrs   (B) ? Atypical infection - antibiotics stopped after dose on 2/26 due to normal pro calcitonin  - No h/o asthma, COPD- non-smoker - IV steroids given yesterday and today- hold for now and reassess in AM for further need- if needed, will give Prednisone  Obstructive sleep apnea?  - due to obesity and fluid overload,  I am concerned that she may have sleep apnea which may be leading to heart failure - will check a nocturnal pulse ox tonight- she may need to go home with O2 until she can have a sleep study  performed.   Morbid obesity Body mass index is 37.61 kg/m. - needs education and close follow up by PCP for weight loss  - check over night pulse ox for sleep apnea as above  Fatty liver - due to obesity   Severe peripheral neuropathy - states she has had an outpt work up - on Amitriptyline - would like to obtain notes from PCP office in regards to this - PT eval   DVT prophylaxis: Lovenox Code Status: Full code Family Communication:  Disposition Plan: home when stable Consultants:    Procedures:  Study Conclusions  - Left ventricle: The cavity size was normal. Wall thickness was   increased in a pattern of moderate LVH. Systolic function was   normal. The estimated ejection fraction was in the range of 55%   to 60%. Wall motion was normal; there were no regional wall   motion abnormalities. Doppler parameters are consistent with   abnormal left ventricular relaxation (grade 1 diastolic   dysfunction). The E/e&' ratio is <8, suggesting normal LV filling   pressure. - Left atrium: The atrium was normal in size. - Inferior vena cava: The vessel was normal in size. The   respirophasic diameter changes were in the normal range (>= 50%),   consistent with normal central venous pressure.  Impressions:  - Compared to a prior echo in 02/2014, there is no  significant   change. Antimicrobials:  Anti-infectives    Start     Dose/Rate Route Frequency Ordered Stop   04/23/16 1157  azithromycin (ZITHROMAX) 500 MG injection    Comments:  Barnett Abu   : cabinet override      04/23/16 1157 04/24/16 0014   04/23/16 1100  cefTRIAXone (ROCEPHIN) 1 g in dextrose 5 % 50 mL IVPB     1 g 100 mL/hr over 30 Minutes Intravenous  Once 04/23/16 1054 04/23/16 1207   04/23/16 1100  azithromycin (ZITHROMAX) 500 mg in dextrose 5 % 250 mL IVPB     500 mg 250 mL/hr over 60 Minutes Intravenous  Once 04/23/16 1054 04/23/16 1308       Objective: Vitals:   04/24/16 1920 04/24/16 2032  04/25/16 0535 04/25/16 0851  BP:  121/86 136/86   Pulse:  (!) 112 (!) 103   Resp:  18 18   Temp:  98.2 F (36.8 C) 98 F (36.7 C)   TempSrc:  Oral Oral   SpO2: 94% 93% 96% 93%  Weight:      Height:        Intake/Output Summary (Last 24 hours) at 04/25/16 1340 Last data filed at 04/25/16 0819  Gross per 24 hour  Intake              350 ml  Output             2650 ml  Net            -2300 ml   Filed Weights   04/23/16 1327 04/24/16 0545  Weight: 114.9 kg (253 lb 6.4 oz) 105.7 kg (233 lb)    Examination: General exam: Appears comfortable  HEENT: PERRLA, oral mucosa moist, no sclera icterus or thrush Respiratory system: Clear to auscultation. Respiratory effort normal. Cardiovascular system: S1 & S2 heard, RRR.  No murmurs  Gastrointestinal system: Abdomen soft, non-tender, nondistended. Normal bowel sound. No organomegaly Central nervous system: Alert and oriented. No focal neurological deficits. Extremities: No cyanosis, clubbing or edema Skin: No rashes or ulcers Psychiatry:  Mood & affect appropriate.     Data Reviewed: I have personally reviewed following labs and imaging studies  CBC:  Recent Labs Lab 04/23/16 0903 04/24/16 0345  WBC 4.7 6.1  HGB 12.9 12.4  HCT 40.0 38.1  MCV 91.7 90.9  PLT 258 253   Basic Metabolic Panel:  Recent Labs Lab 04/23/16 0903 04/23/16 1422 04/24/16 0345 04/25/16 0403  NA 138  --  137 135  K 4.3  --  4.0 4.7  CL 102  --  100* 102  CO2 28  --  27 25  GLUCOSE 97  --  111* 140*  BUN 8  --  12 19  CREATININE 0.94 0.92 0.88 0.87  CALCIUM 9.3  --  9.2 9.8   GFR: Estimated Creatinine Clearance: 96.2 mL/min (by C-G formula based on SCr of 0.87 mg/dL). Liver Function Tests:  Recent Labs Lab 04/23/16 0903  AST 31  ALT 27  ALKPHOS 75  BILITOT 0.5  PROT 8.6*  ALBUMIN 3.7   No results for input(s): LIPASE, AMYLASE in the last 168 hours. No results for input(s): AMMONIA in the last 168 hours. Coagulation Profile: No  results for input(s): INR, PROTIME in the last 168 hours. Cardiac Enzymes:  Recent Labs Lab 04/23/16 0903  TROPONINI <0.03   BNP (last 3 results) No results for input(s): PROBNP in the last 8760 hours. HbA1C: No results for input(s): HGBA1C  in the last 72 hours. CBG: No results for input(s): GLUCAP in the last 168 hours. Lipid Profile: No results for input(s): CHOL, HDL, LDLCALC, TRIG, CHOLHDL, LDLDIRECT in the last 72 hours. Thyroid Function Tests: No results for input(s): TSH, T4TOTAL, FREET4, T3FREE, THYROIDAB in the last 72 hours. Anemia Panel: No results for input(s): VITAMINB12, FOLATE, FERRITIN, TIBC, IRON, RETICCTPCT in the last 72 hours. Urine analysis:    Component Value Date/Time   COLORURINE YELLOW 03/13/2014 1222   APPEARANCEUR CLEAR 03/13/2014 1222   LABSPEC 1.012 03/13/2014 1222   PHURINE 6.0 03/13/2014 1222   GLUCOSEU NEGATIVE 03/13/2014 1222   HGBUR NEGATIVE 03/13/2014 1222   BILIRUBINUR LARGE (A) 03/13/2014 1222   KETONESUR NEGATIVE 03/13/2014 1222   PROTEINUR NEGATIVE 03/13/2014 1222   UROBILINOGEN 1.0 03/13/2014 1222   NITRITE NEGATIVE 03/13/2014 1222   LEUKOCYTESUR NEGATIVE 03/13/2014 1222   Sepsis Labs: @LABRCNTIP (procalcitonin:4,lacticidven:4) ) Recent Results (from the past 240 hour(s))  Culture, blood (routine x 2)     Status: None (Preliminary result)   Collection Time: 04/23/16 11:10 AM  Result Value Ref Range Status   Specimen Description BLOOD LEFT AC  Final   Special Requests BOTTLES DRAWN AEROBIC AND ANAEROBIC 5CC EACH  Final   Culture   Final    NO GROWTH < 24 HOURS Performed at [facility], [address] [place], [place]    Report Status PENDING  Incomplete  Culture, blood (routine x 2)     Status: None (Preliminary result)   Collection Time: 04/23/16 11:10 AM  Result Value Ref Range Status   Specimen Description BLOOD RIGHT AC  Final   Special Requests BOTTLES DRAWN AEROBIC AND ANAEROBIC 5CC EACH  Final    Culture   Final    NO GROWTH < 24 HOURS Performed at [facility], [address] [place], [place]    Report Status PENDING  Incomplete         Radiology Studies: Dg Chest Port 1 View  Result Date: 04/24/2016 CLINICAL DATA:  Dyspnea EXAM: PORTABLE CHEST 1 VIEW COMPARISON:  04/23/2016 FINDINGS: Improved aeration in the lungs. Improved lung volume. Decrease in bibasilar atelectasis. Elevated right hemidiaphragm with persistent mild right lower lobe atelectasis. Negative for heart failure. No pleural effusion. IMPRESSION: Improved aeration in the lungs bilaterally. Mild residual right lower lobe atelectasis. Electronically Signed   By: 04/25/2016 M.D.   On: 04/24/2016 09:22   Dg Abd Portable 1v  Result Date: 04/24/2016 CLINICAL DATA:  Severe right-sided abdomen pain for 1 day with some distention EXAM: PORTABLE ABDOMEN - 1 VIEW COMPARISON:  None. FINDINGS: Supine views of the abdomen show no evidence of bowel obstruction. There is primarily colonic bowel gas with only a small amount of small bowel gas, none of which is distended. No opaque calculi is seen. No radiographic evidence of constipation is noted. No bony abnormality is seen. IMPRESSION: No bowel obstruction.  No opaque calculi. Electronically Signed   By: 04/26/2016 M.D.   On: 04/24/2016 09:28   04/26/2016 Abdomen Limited Ruq  Result Date: 04/25/2016 CLINICAL DATA:  Acute onset of right upper quadrant abdominal pain, generalized chest pain and shortness of breath. Initial encounter. EXAM: 04/27/2016 ABDOMEN LIMITED - RIGHT UPPER QUADRANT COMPARISON:  Abdominal ultrasound performed 08/08/2011 FINDINGS: Gallbladder: No gallstones or wall thickening visualized. Difficult to fully assess due to overlying structures. No sonographic Murphy sign noted by sonographer. Common bile duct: Not characterized. Liver: No focal lesion identified. Diffusely increased  parenchymal echogenicity and coarsened echotexture, markedly limiting evaluation.  IMPRESSION: 1. Markedly suboptimal study due to the patient's habitus and the extent of fatty infiltration within the liver. 2. Gallbladder grossly unremarkable, though not well assessed. Common bile duct not seen. Electronically Signed   By: Roanna Raider M.D.   On: 04/25/2016 00:48      Scheduled Meds: . amitriptyline  150 mg Oral QHS  . atorvastatin  40 mg Oral q1800  . enoxaparin (LOVENOX) injection  40 mg Subcutaneous Q24H  . furosemide  40 mg Intravenous Q12H  . sodium chloride flush  3 mL Intravenous Q12H   Continuous Infusions:   LOS: 2 days    Time spent in minutes: 35 min    Evaristo Tsuda, MD [facility] Pager: [email or web address] Password [email or web address] 04/25/2016, 1:40 PM

## 2016-04-25 NOTE — Progress Notes (Signed)
Pt placed on overnight pulseox per MD rx.  Pt is on room air at this time.  RN aware.

## 2016-04-25 NOTE — Evaluation (Signed)
Occupational Therapy Evaluation Patient Details Name: Melinda Woods MRN: [id number] DOB: 1965/04/25 Today's Date: 04/25/2016    History of Present Illness pt was admitted with dyspnea.  Found to have diastolic CHF.  H/O morbid obesity and peripheral neuropathy.     Clinical Impression   This 51 year old female was admitted for the above. She will benefit from continued OT to increase safety and independence with adls as well as increase endurance and further educate on energy conservation.  Goals in acute are for supervision to mod I level.  Pt had assistance with LB dressing at baseline. 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    Follow Up Recommendations  Home health OT    Equipment Recommendations  Other (comment) (RW (OT))    Recommendations for Other Services       Precautions / Restrictions Precautions Precautions: Fall Precaution Comments: pt has peripheral neuropathy Restrictions Weight Bearing Restrictions: No      Mobility Bed Mobility Overal bed mobility: Modified Independent             General bed mobility comments: HOB raised  Transfers Overall transfer level: Needs assistance Equipment used: None;Rolling walker (2 wheeled) Transfers: Sit to/from Stand Sit to Stand: Supervision              Balance Overall balance assessment: Needs assistance             Standing balance comment: needs RW for mobility for safety                            ADL Overall ADL's : Needs assistance/impaired     Grooming: Wash/dry hands;Supervision/safety;Standing   Upper Body Bathing: Set up;Sitting   Lower Body Bathing: Moderate assistance;Sit to/from stand   Upper Body Dressing : Set up;Sitting   Lower Body Dressing: Moderate assistance;Sit to/from stand Lower Body Dressing Details (indicate cue type and reason): wears slip on shoes Toilet Transfer: Min guard;Comfort height toilet;Ambulation;BSC;RW Toilet Transfer  Details (indicate cue type and reason): ambulated without RW and pt furniture walked. She used RW on way back Toileting- Architect and Hygiene: Supervision/safety;Sit to/from stand       Functional mobility during ADLs: Min guard General ADL Comments: educated on AE for back comfort.  Husband assists her at home. Also educated on short bursts of activity and frequent rest breaks to build endurance.  Sats 97% on RA and HR up to 130 after walking to bathroom.  Encouraged her to get assistance for childcare while she recovers her strength/endurance.  Talked about sponge bathing initially vs getting a shower seat as pt fatiques easily     Vision         Perception     Praxis      Pertinent Vitals/Pain Pain Assessment: Faces Faces Pain Scale: Hurts little more Pain Location: bil feet after standing Pain Intervention(s): Limited activity within patient's tolerance;Monitored during session;Repositioned     Hand Dominance     Extremity/Trunk Assessment Upper Extremity Assessment Upper Extremity Assessment: Overall WFL for tasks assessed           Communication Communication Communication: No difficulties   Cognition Arousal/Alertness: Awake/alert Behavior During Therapy: WFL for tasks assessed/performed Overall Cognitive Status: Within Functional Limits for tasks assessed                     General Comments       Exercises  Shoulder Instructions      Home Living Family/patient expects to be discharged to:: Private residence Living Arrangements: Spouse/significant other Available Help at Discharge: Family               Bathroom Shower/Tub: Tub/shower unit Shower/tub characteristics: Curtain Firefighter: Handicapped height     Home Equipment: Grab bars - tub/shower   Additional Comments: pt reports that she watches 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      Prior Functioning/Environment Level of Independence:  Independent                 OT Problem List: Decreased strength;Decreased activity tolerance;Decreased knowledge of use of DME or AE;Pain      OT Treatment/Interventions: Self-care/ADL training;Patient/family education;Balance training;DME and/or AE instruction    OT Goals(Current goals can be found in the care plan section) Acute Rehab OT Goals Patient Stated Goal: home, hopefully today OT Goal Formulation: With patient Time For Goal Achievement: 05/02/16 Potential to Achieve Goals: Good ADL Goals Pt Will Transfer to Toilet: with modified independence;ambulating (high commode) Pt Will Perform Tub/Shower Transfer: Tub transfer;with min guard assist;ambulating;grab bars Additional ADL Goal #1: pt will initiate at least one rest break for energy conservation Additional ADL Goal #2: pt will perform LB adls with set up sit to stand with AE for energy conservation and to minimize back pain  OT Frequency: Min 2X/week   Barriers to D/C:            Co-evaluation              End of Session Nurse Communication: Mobility status  Activity Tolerance: Patient limited by fatigue Patient left: in chair;with call bell/phone within reach  OT Visit Diagnosis: Unsteadiness on feet (R26.81)                ADL either performed or assessed with clinical judgement  Time: 1341-1401 OT Time Calculation (min): 20 min Charges:  OT General Charges $OT Visit: 1 Procedure OT Evaluation $OT Eval Low Complexity: 1 Procedure G-Codes:     [place], OTR/L [phone] 04/25/2016  Melinda Woods 04/25/2016, 2:13 PM

## 2016-04-26 DIAGNOSIS — I5031 Acute diastolic (congestive) heart failure: Secondary | ICD-10-CM

## 2016-04-26 DIAGNOSIS — I10 Essential (primary) hypertension: Secondary | ICD-10-CM

## 2016-04-26 DIAGNOSIS — J9601 Acute respiratory failure with hypoxia: Secondary | ICD-10-CM

## 2016-04-26 DIAGNOSIS — J9600 Acute respiratory failure, unspecified whether with hypoxia or hypercapnia: Secondary | ICD-10-CM

## 2016-04-26 LAB — BASIC METABOLIC PANEL
Anion gap: 7 (ref 5–15)
BUN: 19 mg/dL (ref 6–20)
CO2: 28 mmol/L (ref 22–32)
Calcium: 9.7 mg/dL (ref 8.9–10.3)
Chloride: 102 mmol/L (ref 101–111)
Creatinine, Ser: 0.78 mg/dL (ref 0.44–1.00)
GFR calc Af Amer: >60 mL/min (ref >60)
GFR calc non Af Amer: >60 mL/min (ref >60)
GLUCOSE: 158 mg/dL — AB (ref 65–99)
POTASSIUM: 4.1 mmol/L (ref 3.5–5.1)
SODIUM: 137 mmol/L (ref 135–145)

## 2016-04-26 MED ORDER — POTASSIUM CHLORIDE CRYS ER 20 MEQ PO TBCR: 20.0000 meq | EXTENDED_RELEASE_TABLET | Freq: Every day | ORAL | 0 refills | Status: DC | PRN

## 2016-04-26 MED ORDER — LOSARTAN POTASSIUM 25 MG PO TABS: 25.0000 mg | ORAL_TABLET | Freq: Every day | ORAL | 0 refills | Status: AC

## 2016-04-26 MED ORDER — POLYETHYLENE GLYCOL 3350 17 G PO PACK: 17.0000 g | PACK | Freq: Every day | ORAL | 0 refills | Status: AC | PRN

## 2016-04-26 MED ORDER — METOPROLOL TARTRATE 25 MG PO TABS: 25.0000 mg | ORAL_TABLET | Freq: Two times a day (BID) | ORAL | 0 refills | Status: AC

## 2016-04-26 MED ORDER — FUROSEMIDE 40 MG PO TABS: 40.0000 mg | ORAL_TABLET | Freq: Every day | ORAL | 0 refills | Status: DC | PRN

## 2016-04-26 MED ORDER — METOPROLOL TARTRATE 25 MG PO TABS
25.0000 mg | ORAL_TABLET | Freq: Two times a day (BID) | ORAL | Status: DC
  Administered 2016-04-26: 25 mg via ORAL
  Filled 2016-04-26: qty 1

## 2016-04-26 MED ORDER — AMITRIPTYLINE HCL 100 MG PO TABS: 100.0000 mg | ORAL_TABLET | Freq: Two times a day (BID) | ORAL | Status: AC

## 2016-04-26 NOTE — Progress Notes (Signed)
Occupational Therapy Treatment Patient Details Name: Melinda Woods MRN: [id number] DOB: [birth year] Today's Date: 04/26/2016    History of present illness pt was admitted with dyspnea.  Found to have diastolic CHF.  H/O morbid obesity and peripheral neuropathy.     OT comments  All education was completed this session. Pt plans home today  Follow Up Recommendations  Supervision - Intermittent    Equipment Recommendations  None recommended by OT (pt has a Rw and 3:1 at home)    Recommendations for Other Services      Precautions / Restrictions Precautions Precautions: Fall Precaution Comments: pt has peripheral neuropathy Restrictions Weight Bearing Restrictions: No       Mobility Bed Mobility               General bed mobility comments: oob  Transfers   Equipment used: Rolling walker (2 wheeled)   Sit to Stand: Supervision         General transfer comment: for safety.     Balance                                   ADL                       Lower Body Dressing: Set up;Sit to/from stand;With adaptive equipment   Toilet Transfer: Min guard;Comfort height toilet;Ambulation;BSC;RW             General ADL Comments: pt used sock aide for dressing. Reinforced energy conservation education      Vision                     Perception     Praxis      Cognition   Behavior During Therapy: WFL for tasks assessed/performed Overall Cognitive Status: Within Functional Limits for tasks assessed                         Exercises     Shoulder Instructions       General Comments      Pertinent Vitals/ Pain       Pain Assessment: No/denies pain  Home Living                                          Prior Functioning/Environment              Frequency           Progress Toward Goals  OT Goals(current goals can now be found in the care plan section)  Progress towards OT goals:  Progressing toward goals (no further OT is needed)     Plan Discharge plan needs to be updated    Co-evaluation                 End of Session    OT Visit Diagnosis: Unsteadiness on feet (R26.81)   Activity Tolerance Patient tolerated treatment well   Patient Left in bed   Nurse Communication          Time: 7353-2992 OT Time Calculation (min): 12 min  Charges: OT General Charges $OT Visit: 1 Procedure OT Treatments $Self Care/Home Management : 8-22 mins  Marica Otter, OTR/L [phone] 04/26/2016   Javion Holmer 04/26/2016, 11:25 AM

## 2016-04-26 NOTE — Plan of Care (Signed)
Problem: Food- and Nutrition-Related Knowledge Deficit (NB-1.1) Goal: Nutrition education Formal process to instruct or train a patient/client in a skill or to impart knowledge to help patients/clients voluntarily manage or modify food choices and eating behavior to maintain or improve health. Outcome: Completed/Met Date Met: 04/26/16 Nutrition Education Note  RD consulted for nutrition education for low sodium diet.  RD provided "Low Sodium Nutrition Therapy" handout from the [employer]. Reviewed patient's dietary recall. Provided examples on ways to decrease sodium intake in diet. Discouraged intake of processed foods and use of salt shaker. Encouraged fresh fruits and vegetables as well as whole grain sources of carbohydrates to maximize fiber intake.   RD discussed why it is important for patient to adhere to diet recommendations, and emphasized the role of fluids, foods to avoid, and importance of weighing self daily. Teach back method used.  Expect fair compliance.  Body mass index is 37.72 kg/m. Pt meets criteria for obesity based on current BMI.  Current diet order is regular, patient is consuming approximately 100% of meals at this time. Labs and medications reviewed. No further nutrition interventions warranted at this time. If additional nutrition issues arise, please re-consult RD.   Melinda Bibles, MS, RD, LDN Pager: [phone] After Hours Pager: [phone]

## 2016-04-26 NOTE — Discharge Summary (Signed)
Physician Discharge Summary  Melinda Woods UJW:[id number] DOB: 1965-05-09 DOA: 04/23/2016  PCP: Leola Brazil, DO  Admit date: 04/23/2016 Discharge date: 04/26/2016  Admitted From: home  Disposition:  home  Recommendations for Outpatient Follow-up:  1. Needs a f/u ASAP - needs a Bmet in 1 wk due to being a new start on lasix, K, and Losartan 2. follow fluid status, check A1c, fasting lipid panel 3. Needs sleep study - I have started night time O2 4. Amitriptyline dose decreased from 120 TID to 120 BID which is the max dose that can be given safely 5.   Walker ordered - no HHPT needed 6.   Needs to be monitored closely for weight loss as her weight is the cause of many of her current health problems   Discharge Condition:  stable   CODE STATUS:  Full code   Diet recommendation:  Heart healthy, low sodium, low carb Consultations:  none    Discharge Diagnoses:  Principal Problem:   Acute respiratory failure (HCC) Active Problems:   Acute diastolic heart failure (HCC)   Hypertension   Morbid obesity (HCC)   Peripheral neuropathy (HCC)   Fatty liver    Subjective: Breathing is continuing to improve. No cough.   Brief Summary: Melinda Woods a 51 y.o.femalewith medical history who has poor health literacy and medical history significant for morbid obesity and peripheral neuropathy who for the last few weeks started noticing increasing dyspnea with exertion and a nonproductive cough. She went to the emergency room at that time on 2/16 and had a normal chest x-ray and labs was given an albuterol inhaler on discharge and felt better. Patient does not smoke or has a previous history of bronchitis or asthma.   She returned to emergency room 10 days later when symptoms get worse. CT of the chest suggestive of pulmonary edema vs atypical infection. She states her family member had recently told her that she was gaining "fluid weight". Her feet were newly swollen.   Hospital  Course:  Hospital Course:  Acute resp failure   Acute D CHF - BNP not high likely due to obesity- states feet have been swollen and weight has gone up about 20 lbs - antibiotics stopped after dose on 2/26 due to normal pro calcitonin  - No h/o asthma or COPD- non-smoker - ECHO shows grade 1 dCHF, mod LVH - suspect she has sleep apnea which is contributing - morbid obesity is likely contributing - has diuresed and is in negative balance by 5L- pulse ox  95% on room air at rest 96% on ambulation - weight 253 >> 233 - pedal edema has resolved - also has some atelectasis due to being non ambulatory - started IS - CHF education given to her- I have discussed diet and reduced sodium intake -  explained to use PRN Lasix - Metoprolol and Losartan started.    Morbid obesity - Body mass index is 37.61 kg/m. - needs education and close follow up by PCP for weight loss  - admits she eats heavily- have discussed with her to focus on weight loss -  over night pulse oximetry for sleep apnea performed- pulse ox dropped into 80s and low 70s  Fatty liver - due to obesity - have discussed steady weight loss  Severe peripheral neuropathy? - states she has had an outpt "nerve test" but has never seen a neurologist- her rheumatologist states she has nothing wrong with her feet- - on Amitriptyline 150 mg TID, max dose  should be 150 BID-  - PCP to address further - PT eval done- despite the pain, she was able to ambulate  Discharge Instructions  Discharge Instructions    (HEART FAILURE PATIENTS) Call MD:  Anytime you have any of the following symptoms: 1) 3 pound weight gain in 24 hours or 5 pounds in 1 week 2) shortness of breath, with or without a dry hacking cough 3) swelling in the hands, feet or stomach 4) if you have to sleep on extra pillows at night in order to breathe.    Complete by:  As directed    Diet - low sodium heart healthy    Complete by:  As directed    Increase activity  slowly    Complete by:  As directed      Allergies as of 04/26/2016      Reactions   Oxycodone Base Hives   Latex Rash      Medication List    STOP taking these medications   albuterol 108 (90 Base) MCG/ACT inhaler Commonly known as:  PROVENTIL HFA;VENTOLIN HFA     TAKE these medications   amitriptyline 100 MG tablet Commonly known as:  ELAVIL Take 1 tablet (100 mg total) by mouth 2 (two) times daily. What changed:  when to take this   furosemide 40 MG tablet Commonly known as:  LASIX Take 1 tablet (40 mg total) by mouth daily as needed for fluid (see instructions on d/c summary).   losartan 25 MG tablet Commonly known as:  COZAAR Take 1 tablet (25 mg total) by mouth daily.   metoprolol tartrate 25 MG tablet Commonly known as:  LOPRESSOR Take 1 tablet (25 mg total) by mouth 2 (two) times daily.   polyethylene glycol packet Commonly known as:  MIRALAX / GLYCOLAX Take 17 g by mouth daily as needed for mild constipation.   potassium chloride SA 20 MEQ tablet Commonly known as:  K-DUR,KLOR-CON Take 1 tablet (20 mEq total) by mouth daily as needed.            Durable Medical Equipment        Start     Ordered   04/26/16 [id number]  For home use only DME oxygen  Once    Question Answer Comment  Mode or (Route) Nasal cannula   Liters per Minute 2   Frequency Only at night (stationary unit needed)   Oxygen conserving device No   Oxygen delivery system Gas      04/26/16 0917     Follow-up Information    Leola Brazil, DO.   Specialty:  Internal Medicine Why:  Dr. Adriana Simas office will call pt to set up appt. Contact information: [address] [place] [address] [phone]          Allergies  Allergen Reactions  . Oxycodone Base Hives  . Latex Rash     Procedures/Studies: Overnight pulse oximetry  2 d ECHO Left ventricle: The cavity size was normal. Wall thickness was   increased in a pattern of moderate LVH. Systolic  function was   normal. The estimated ejection fraction was in the range of 55%   to 60%. Wall motion was normal; there were no regional wall   motion abnormalities. Doppler parameters are consistent with   abnormal left ventricular relaxation (grade 1 diastolic   dysfunction). The E/e&' ratio is <8, suggesting normal LV filling   pressure. - Left atrium: The atrium was normal in size. - Inferior vena  cava: The vessel was normal in size. The   respirophasic diameter changes were in the normal range (>= 50%),   consistent with normal central venous pressure.   Dg Chest 2 View  Result Date: 04/13/2016 CLINICAL DATA:  Shortness of breath for 3 days. EXAM: CHEST  2 VIEW COMPARISON:  December 22, 2012 FINDINGS: Lungs are clear. The heart size and pulmonary vascularity are normal. No adenopathy. No bone lesions. IMPRESSION: No edema or consolidation. Electronically Signed   By: Bretta Bang III M.D.   On: 04/13/2016 09:20   Ct Angio Chest Pe W/cm &/or Wo Cm  Result Date: 04/23/2016 CLINICAL DATA:  Shortness of breath and wheezing EXAM: CT ANGIOGRAPHY CHEST WITH CONTRAST TECHNIQUE: Multidetector CT imaging of the chest was performed using the standard protocol during bolus administration of intravenous contrast. Multiplanar CT image reconstructions and MIPs were obtained to evaluate the vascular anatomy. CONTRAST:  100 mL Isovue 370 nonionic COMPARISON:  Chest radiograph April 23, 2016 extent FINDINGS: Cardiovascular: There is no demonstrable pulmonary embolus. No thoracic aortic aneurysm or dissection. The visualized great vessels appear normal. Pericardium is not appreciably thickened. Mediastinum/Nodes: Thyroid appears unremarkable. There is no appreciable thoracic adenopathy. Lungs/Pleura: There is prominence of the interstitium diffusely, felt to represent a degree of edema. There is patchy atelectatic change in the lower lobe regions. There is no frank airspace consolidation. No pleural  effusion evident. Upper Abdomen: In the visualized upper abdomen, there appears to be a degree of hepatic steatosis. Visualized upper abdomen otherwise appears unremarkable. Musculoskeletal: There are no blastic or lytic bone lesions. There is degenerative change in the lower thoracic region. A prominent Schmorl's node is noted in a lower thoracic vertebral body. Review of the MIP images confirms the above findings. IMPRESSION: No evident pulmonary embolus. Somewhat generalized interstitial prominence suggesting a degree of interstitial pulmonary edema. Question early congestive heart failure versus noncardiogenic etiology. An allergic type response causing diffuse changes of this nature and is a differential consideration. Widespread atypical infectious pneumonia also is a possibility. No airspace consolidation. Patchy atelectasis is noted in the lower lobes. No appreciable adenopathy. Evidence of hepatic steatosis. Electronically Signed   By: Bretta Bang III M.D.   On: 04/23/2016 10:25   Dg Chest Port 1 View  Result Date: 04/24/2016 CLINICAL DATA:  Dyspnea EXAM: PORTABLE CHEST 1 VIEW COMPARISON:  04/23/2016 FINDINGS: Improved aeration in the lungs. Improved lung volume. Decrease in bibasilar atelectasis. Elevated right hemidiaphragm with persistent mild right lower lobe atelectasis. Negative for heart failure. No pleural effusion. IMPRESSION: Improved aeration in the lungs bilaterally. Mild residual right lower lobe atelectasis. Electronically Signed   By: Marlan Palau M.D.   On: 04/24/2016 09:22   Dg Chest Port 1 View  Result Date: 04/23/2016 CLINICAL DATA:  Shortness of breath and wheezing EXAM: PORTABLE CHEST 1 VIEW COMPARISON:  April 13, 2016 FINDINGS: There is mild interstitial edema. No airspace consolidation. Heart is upper normal in size with pulmonary vascularity within normal limits. No adenopathy. No bone lesions. Trachea appears normal. IMPRESSION: Mild interstitial edema. No  airspace consolidation. Stable cardiac silhouette. Question early changes of congestive heart failure. Electronically Signed   By: Bretta Bang III M.D.   On: 04/23/2016 09:15   Dg Abd Portable 1v  Result Date: 04/24/2016 CLINICAL DATA:  Severe right-sided abdomen pain for 1 day with some distention EXAM: PORTABLE ABDOMEN - 1 VIEW COMPARISON:  None. FINDINGS: Supine views of the abdomen show no evidence of bowel obstruction. There is primarily colonic  bowel gas with only a small amount of small bowel gas, none of which is distended. No opaque calculi is seen. No radiographic evidence of constipation is noted. No bony abnormality is seen. IMPRESSION: No bowel obstruction.  No opaque calculi. Electronically Signed   By: Dwyane Dee M.D.   On: 04/24/2016 09:28   US Abdomen Limited Ruq  Result Date: 04/25/2016 CLINICAL DATA:  Acute onset of right upper quadrant abdominal pain, generalized chest pain and shortness of breath. Initial encounter. EXAM: US ABDOMEN LIMITED - RIGHT UPPER QUADRANT COMPARISON:  Abdominal ultrasound performed 08/08/2011 FINDINGS: Gallbladder: No gallstones or wall thickening visualized. Difficult to fully assess due to overlying structures. No sonographic Murphy sign noted by sonographer. Common bile duct: Not characterized. Liver: No focal lesion identified. Diffusely increased parenchymal echogenicity and coarsened echotexture, markedly limiting evaluation. IMPRESSION: 1. Markedly suboptimal study due to the patient's habitus and the extent of fatty infiltration within the liver. 2. Gallbladder grossly unremarkable, though not well assessed. Common bile duct not seen. Electronically Signed   By: Roanna Raider M.D.   On: 04/25/2016 00:48       Discharge Exam: Vitals:   04/25/16 2047 04/26/16 0418  BP: 131/89 136/82  Pulse: (!) 113 (!) 114  Resp: 20 18  Temp: 98.7 F (37.1 C) 98.4 F (36.9 C)   Vitals:   04/25/16 1555 04/25/16 2047 04/26/16 0407 04/26/16 0418  BP:   131/89  136/82  Pulse: (!) 136 (!) 113  (!) 114  Resp:  20  18  Temp:  98.7 F (37.1 C)  98.4 F (36.9 C)  TempSrc:  Oral  Oral  SpO2: 96% 99%  96%  Weight:   106 kg (233 lb 11 oz)   Height:        General: Pt is alert, awake, not in acute distress Cardiovascular: RRR, S1/S2 +, no rubs, no gallops Respiratory: CTA bilaterally, no wheezing, no rhonchi Abdominal: Soft, NT, ND, bowel sounds + Extremities: no edema, no cyanosis    The results of significant diagnostics from this hospitalization (including imaging, microbiology, ancillary and laboratory) are listed below for reference.     Microbiology: Recent Results (from the past 240 hour(s))  Culture, blood (routine x 2)     Status: None (Preliminary result)   Collection Time: 04/23/16 11:10 AM  Result Value Ref Range Status   Specimen Description BLOOD LEFT AC  Final   Special Requests BOTTLES DRAWN AEROBIC AND ANAEROBIC 5CC EACH  Final   Culture   Final    NO GROWTH 2 DAYS Performed at [facility], [address], [place], [place] [address]    Report Status PENDING  Incomplete  Culture, blood (routine x 2)     Status: None (Preliminary result)   Collection Time: 04/23/16 11:10 AM  Result Value Ref Range Status   Specimen Description BLOOD RIGHT AC  Final   Special Requests BOTTLES DRAWN AEROBIC AND ANAEROBIC 5CC EACH  Final   Culture   Final    NO GROWTH 2 DAYS Performed at [facility], [address] [place], [place] [address]    Report Status PENDING  Incomplete     Labs: BNP (last 3 results)  Recent Labs  04/23/16 0903  BNP 8.0   Basic Metabolic Panel:  Recent Labs Lab 04/23/16 0903 04/23/16 1422 04/24/16 0345 04/25/16 0403 04/26/16 0409  NA 138  --  137 135 137  K 4.3  --  4.0 4.7 4.1  CL 102  --  100* 102 102  CO2 28  --  27 25 28   GLUCOSE 97  --  111* 140* 158*  BUN 8  --  12 19 19   CREATININE 0.94 0.92 0.88 0.87 0.78  CALCIUM 9.3  --  9.2 9.8 9.7   Liver Function  Tests:  Recent Labs Lab 04/23/16 0903  AST 31  ALT 27  ALKPHOS 75  BILITOT 0.5  PROT 8.6*  ALBUMIN 3.7   No results for input(s): LIPASE, AMYLASE in the last 168 hours. No results for input(s): AMMONIA in the last 168 hours. CBC:  Recent Labs Lab 04/23/16 0903 04/24/16 0345  WBC 4.7 6.1  HGB 12.9 12.4  HCT 40.0 38.1  MCV 91.7 90.9  PLT 258 253   Cardiac Enzymes:  Recent Labs Lab 04/23/16 0903  TROPONINI <0.03   BNP: Invalid input(s): POCBNP CBG: No results for input(s): GLUCAP in the last 168 hours. D-Dimer No results for input(s): DDIMER in the last 72 hours. Hgb A1c No results for input(s): HGBA1C in the last 72 hours. Lipid Profile No results for input(s): CHOL, HDL, LDLCALC, TRIG, CHOLHDL, LDLDIRECT in the last 72 hours. Thyroid function studies No results for input(s): TSH, T4TOTAL, T3FREE, THYROIDAB in the last 72 hours.  Invalid input(s): FREET3 Anemia work up No results for input(s): VITAMINB12, FOLATE, FERRITIN, TIBC, IRON, RETICCTPCT in the last 72 hours. Urinalysis    Component Value Date/Time   COLORURINE YELLOW 03/13/2014 1222   APPEARANCEUR CLEAR 03/13/2014 1222   LABSPEC 1.012 03/13/2014 1222   PHURINE 6.0 03/13/2014 1222   GLUCOSEU NEGATIVE 03/13/2014 1222   HGBUR NEGATIVE 03/13/2014 1222   BILIRUBINUR LARGE (A) 03/13/2014 1222   KETONESUR NEGATIVE 03/13/2014 1222   PROTEINUR NEGATIVE 03/13/2014 1222   UROBILINOGEN 1.0 03/13/2014 1222   NITRITE NEGATIVE 03/13/2014 1222   LEUKOCYTESUR NEGATIVE 03/13/2014 1222   Sepsis Labs Invalid input(s): PROCALCITONIN,  WBC,  LACTICIDVEN Microbiology Recent Results (from the past 240 hour(s))  Culture, blood (routine x 2)     Status: None (Preliminary result)   Collection Time: 04/23/16 11:10 AM  Result Value Ref Range Status   Specimen Description BLOOD LEFT AC  Final   Special Requests BOTTLES DRAWN AEROBIC AND ANAEROBIC 5CC EACH  Final   Culture   Final    NO GROWTH 2 DAYS Performed at  [facility], [address], [place], [place] [address]    Report Status PENDING  Incomplete  Culture, blood (routine x 2)     Status: None (Preliminary result)   Collection Time: 04/23/16 11:10 AM  Result Value Ref Range Status   Specimen Description BLOOD RIGHT AC  Final   Special Requests BOTTLES DRAWN AEROBIC AND ANAEROBIC 5CC EACH  Final   Culture   Final    NO GROWTH 2 DAYS Performed at [facility], [address] [place], [place] [address]    Report Status PENDING  Incomplete     Time coordinating discharge: Over 30 minutes  SIGNED:   Calvert Cantor, MD  [facility] 04/26/2016, 9:31 AM Pager   If 7PM-7AM, please contact night-coverage [email or web address] Password [email or web address]

## 2016-04-26 NOTE — Plan of Care (Signed)
Problem: Safety: Goal: Ability to remain free from injury will improve Outcome: Progressing Patient will remain free from falls and injury while in the hospital. Call bell within reach, non skid socks on, bed in lowest position. Bed/Chair alarm activated.

## 2016-04-26 NOTE — Progress Notes (Signed)
Pt with order for nocturnal home 02. [facility] contacted for 02 need. No other CM needs communicated. Sandford Craze RN,BSN,NCM [phone]

## 2016-04-26 NOTE — Discharge Instructions (Signed)
Take Lasix 40 mg once daily and cut back on fluid and salt intake for weight gain of 3 lb in 24 hrs. Continue to take it daily (with potassium) until weight comes back to normal. Call your doctor to make them aware.    Heart Failure Heart failure means your heart has trouble pumping blood. This makes it hard for your body to work well. Heart failure is usually a long-term (chronic) condition. You must take good care of yourself and follow your doctor's treatment plan. Follow these instructions at home:  Take your heart medicine as told by your doctor.  Do not stop taking medicine unless your doctor tells you to.  Do not skip any dose of medicine.  Refill your medicines before they run out.  Take other medicines only as told by your doctor or pharmacist.  Stay active if told by your doctor. The elderly and people with severe heart failure should talk with a doctor about physical activity.  Eat heart-healthy foods. Choose foods that are without trans fat and are low in saturated fat, cholesterol, and salt (sodium). This includes fresh or frozen fruits and vegetables, fish, lean meats, fat-free or low-fat dairy foods, whole grains, and high-fiber foods. Lentils and dried peas and beans (legumes) are also good choices.  Limit salt if told by your doctor.  Cook in a healthy way. Roast, grill, broil, bake, poach, steam, or stir-fry foods.  Limit fluids as told by your doctor.  Weigh yourself every morning. Do this after you pee (urinate) and before you eat breakfast. Write down your weight to give to your doctor.  Take your blood pressure and write it down if your doctor tells you to.  Ask your doctor how to check your pulse. Check your pulse as told.  Lose weight if told by your doctor.  Stop smoking or chewing tobacco. Do not use gum or patches that help you quit without your doctor's approval.  Schedule and go to doctor visits as told.  Nonpregnant women should have no more than 1  drink a day. Men should have no more than 2 drinks a day. Talk to your doctor about drinking alcohol.  Stop illegal drug use.  Stay current with shots (immunizations).  Manage your health conditions as told by your doctor.  Learn to manage your stress.  Rest when you are tired.  If it is really hot outside:  Avoid intense activities.  Use air conditioning or fans, or get in a cooler place.  Avoid caffeine and alcohol.  Wear loose-fitting, lightweight, and light-colored clothing.  If it is really cold outside:  Avoid intense activities.  Layer your clothing.  Wear mittens or gloves, a hat, and a scarf when going outside.  Avoid alcohol.  Learn about heart failure and get support as needed.  Get help to maintain or improve your quality of life and your ability to care for yourself as needed. Contact a doctor if:  You gain weight quickly.  You are more short of breath than usual.  You cannot do your normal activities.  You tire easily.  You cough more than normal, especially with activity.  You have any or more puffiness (swelling) in areas such as your hands, feet, ankles, or belly (abdomen).  You cannot sleep because it is hard to breathe.  You feel like your heart is beating fast (palpitations).  You get dizzy or light-headed when you stand up. Get help right away if:  You have trouble breathing.  There  is a change in mental status, such as becoming less alert or not being able to focus.  You have chest pain or discomfort.  You faint. This information is not intended to replace advice given to you by your health care provider. Make sure you discuss any questions you have with your health care provider. Document Released: 11/22/2007 Document Revised: 07/21/2015 Document Reviewed: 03/31/2012 Elsevier Interactive Patient Education  2017 [employer].

## 2016-04-26 NOTE — Progress Notes (Signed)
Overnight Pulse Ox study complete and Report placed in Pt's chart.  RT to monitor and assess as needed.

## 2016-04-28 LAB — CULTURE, BLOOD (ROUTINE X 2)
CULTURE: NO GROWTH
CULTURE: NO GROWTH

## 2016-05-17 ENCOUNTER — Encounter (HOSPITAL_BASED_OUTPATIENT_CLINIC_OR_DEPARTMENT_OTHER): Payer: Self-pay | Admitting: *Deleted

## 2016-05-17 ENCOUNTER — Emergency Department (HOSPITAL_BASED_OUTPATIENT_CLINIC_OR_DEPARTMENT_OTHER): Payer: 59

## 2016-05-17 ENCOUNTER — Emergency Department (HOSPITAL_BASED_OUTPATIENT_CLINIC_OR_DEPARTMENT_OTHER)
Admission: EM | Admit: 2016-05-17 | Discharge: 2016-05-17 | Disposition: A | Discharge status: Home / Self Care | Payer: 59 | Attending: Emergency Medicine | Admitting: Emergency Medicine

## 2016-05-17 DIAGNOSIS — I110 Hypertensive heart disease with heart failure: Secondary | ICD-10-CM | POA: Diagnosis not present

## 2016-05-17 DIAGNOSIS — M62838 Other muscle spasm: Secondary | ICD-10-CM | POA: Diagnosis not present

## 2016-05-17 DIAGNOSIS — R072 Precordial pain: Secondary | ICD-10-CM | POA: Diagnosis not present

## 2016-05-17 DIAGNOSIS — Z7982 Long term (current) use of aspirin: Secondary | ICD-10-CM | POA: Insufficient documentation

## 2016-05-17 DIAGNOSIS — I509 Heart failure, unspecified: Secondary | ICD-10-CM | POA: Insufficient documentation

## 2016-05-17 DIAGNOSIS — R079 Chest pain, unspecified: Secondary | ICD-10-CM | POA: Diagnosis present

## 2016-05-17 DIAGNOSIS — R0789 Other chest pain: Secondary | ICD-10-CM

## 2016-05-17 HISTORY — DX: Fatty (change of) liver, not elsewhere classified: K76.0

## 2016-05-17 HISTORY — DX: Heart failure, unspecified: I50.9

## 2016-05-17 LAB — CBC
HCT: 37.3 % (ref 36.0–46.0)
HEMOGLOBIN: 12.3 g/dL (ref 12.0–15.0)
MCH: 29.9 pg (ref 26.0–34.0)
MCHC: 33 g/dL (ref 30.0–36.0)
MCV: 90.8 fL (ref 78.0–100.0)
Platelets: 302 10*3/uL (ref 150–400)
RBC: 4.11 MIL/uL (ref 3.87–5.11)
RDW: 14 % (ref 11.5–15.5)
WBC: 5.6 10*3/uL (ref 4.0–10.5)

## 2016-05-17 LAB — COMPREHENSIVE METABOLIC PANEL
ALT: 31 U/L (ref 14–54)
ANION GAP: 9 (ref 5–15)
AST: 26 U/L (ref 15–41)
Albumin: 3.9 g/dL (ref 3.5–5.0)
Alkaline Phosphatase: 84 U/L (ref 38–126)
BILIRUBIN TOTAL: 0.4 mg/dL (ref 0.3–1.2)
BUN: 17 mg/dL (ref 6–20)
CALCIUM: 9.2 mg/dL (ref 8.9–10.3)
CO2: 26 mmol/L (ref 22–32)
Chloride: 101 mmol/L (ref 101–111)
Creatinine, Ser: 0.87 mg/dL (ref 0.44–1.00)
GFR calc Af Amer: >60 mL/min (ref >60)
Glucose, Bld: 96 mg/dL (ref 65–99)
POTASSIUM: 3.9 mmol/L (ref 3.5–5.1)
Sodium: 136 mmol/L (ref 135–145)
TOTAL PROTEIN: 8.2 g/dL — AB (ref 6.5–8.1)

## 2016-05-17 LAB — TROPONIN I: Troponin I: <0.03 ng/mL (ref <0.03)

## 2016-05-17 LAB — BRAIN NATRIURETIC PEPTIDE: B Natriuretic Peptide: 5.7 pg/mL (ref 0.0–100.0)

## 2016-05-17 MED ORDER — CYCLOBENZAPRINE HCL 5 MG PO TABS
5.0000 mg | ORAL_TABLET | Freq: Once | ORAL | Status: AC
  Administered 2016-05-17: 5 mg via ORAL
  Filled 2016-05-17: qty 1

## 2016-05-17 MED ORDER — CYCLOBENZAPRINE HCL 10 MG PO TABS: 10.0000 mg | ORAL_TABLET | Freq: Three times a day (TID) | ORAL | 0 refills | Status: DC | PRN

## 2016-05-17 NOTE — ED Notes (Signed)
Patient transported to X-ray 

## 2016-05-17 NOTE — ED Provider Notes (Signed)
[facility] Note   CSN: [id number] Arrival date & time: 05/17/16  1911   By signing my name below, I, Talbert Nan, attest that this documentation has been prepared under the direction and in the presence of Arby Barrette, MD. Electronically Signed: Talbert Nan, Scribe. 05/17/16. 10:47 PM.    History   Chief Complaint Chief Complaint  Patient presents with  . Chest Pain    HPI Melinda Woods is a 51 y.o. female with h/o CHF, HTN who presents to the Emergency Department complaining of sharp left sided chest pain that radiates from the trapezius down her arm that started 2 days ago. Pt uses nightly oxygen. Pt has baseline neuropathy and leg swelling. Pt states that she is taking her medications. Pt has h/o rupture disks in 2007. Pt denies any other symptoms. Pt takes Lasix at home 25 mg. Pt has talked to home care nurse because she has gained 11 lbs within the last several days. Pt is allergic to Oxycodone base and Latex.  The history is provided by the patient. No language interpreter was used.    Past Medical History:  Diagnosis Date  . Anxiety   . Cancer (HCC)   . CHF (congestive heart failure) (HCC)   . Fatty liver   . Hypertension   . Neuropathy (HCC)   . Ovarian cyst     Patient Active Problem List   Diagnosis Date Noted  . Acute respiratory failure (HCC) 04/26/2016  . Fatty liver 04/25/2016  . Hypertension 04/23/2016  . Morbid obesity (HCC) 04/23/2016  . Peripheral neuropathy (HCC) 04/23/2016  . Acute diastolic heart failure (HCC) 04/23/2016  . Right sided weakness 03/13/2014  . TIA (transient ischemic attack) 03/13/2014    Past Surgical History:  Procedure Laterality Date  . TUBAL LIGATION      OB History    No data available       Home Medications    Prior to Admission medications   Medication Sig Start Date End Date Taking? Authorizing Provider  amitriptyline (ELAVIL) 100 MG tablet Take 1 tablet (100 mg total) by mouth 2 (two)  times daily. Patient taking differently: Take 100 mg by mouth 3 (three) times daily.  04/26/16  Yes Calvert Cantor, MD  aspirin EC 81 MG tablet Take 81 mg by mouth daily.   Yes Historical Provider, MD  furosemide (LASIX) 40 MG tablet Take 1 tablet (40 mg total) by mouth daily as needed for fluid (see instructions on d/c summary). 04/26/16  Yes Calvert Cantor, MD  losartan (COZAAR) 25 MG tablet Take 1 tablet (25 mg total) by mouth daily. 04/26/16  Yes Calvert Cantor, MD  metoprolol tartrate (LOPRESSOR) 25 MG tablet Take 1 tablet (25 mg total) by mouth 2 (two) times daily. 04/26/16  Yes Calvert Cantor, MD  polyethylene glycol (MIRALAX / GLYCOLAX) packet Take 17 g by mouth daily as needed for mild constipation. 04/26/16  Yes Calvert Cantor, MD  potassium chloride SA (K-DUR,KLOR-CON) 20 MEQ tablet Take 1 tablet (20 mEq total) by mouth daily as needed (always take with the Furosemide). 04/26/16  Yes Calvert Cantor, MD  cyclobenzaprine (FLEXERIL) 10 MG tablet Take 1 tablet (10 mg total) by mouth 3 (three) times daily as needed for muscle spasms. 05/17/16   Arby Barrette, MD    Family History No family history on file.  Social History Social History  Substance Use Topics  . Smoking status: Never Smoker  . Smokeless tobacco: Never Used  . Alcohol use No  Allergies   Oxycodone base and Latex   Review of Systems Review of Systems  Cardiovascular: Positive for chest pain.    A complete 10 system review of systems was obtained and all systems are negative except as noted in the HPI and PMH.    Physical Exam Updated Vital Signs BP (!) 117/95   Pulse 100   Temp 98.6 F (37 C) (Oral)   Resp 15   Ht 5\' 6"  (1.676 m)   Wt 252 lb (114.3 kg)   LMP 04/19/2016 (Exact Date)   SpO2 93%   BMI 40.67 kg/m   Physical Exam  Constitutional: She is oriented to person, place, and time. She appears well-developed and well-nourished.  HENT:  Head: Normocephalic and atraumatic.  Cardiovascular: Normal rate, regular  rhythm, normal heart sounds and intact distal pulses.  Exam reveals no gallop and no friction rub.   No murmur heard. Pulmonary/Chest: Effort normal. No respiratory distress. She has rales.  Basilar rales bilaterally. No respiratory distress.  Musculoskeletal: She exhibits edema.  TPP to left trapezius. Tenderness to left upper chest just inferior to clavicle. With ROM left arm pain reproducible at distal clavicle. No peripheral edema, calves non-tender.  Neurological: She is alert and oriented to person, place, and time.  Skin: Skin is warm and dry.  Psychiatric: She has a normal mood and affect.  Nursing note and vitals reviewed.    ED Treatments / Results   DIAGNOSTIC STUDIES: Oxygen Saturation is 93% on room air, low by my interpretation.    COORDINATION OF CARE: 10:34 PM Discussed treatment plan with pt at bedside and pt agreed to plan.    Labs (all labs ordered are listed, but only abnormal results are displayed) Labs Reviewed  COMPREHENSIVE METABOLIC PANEL - Abnormal; Notable for the following:       Result Value   Total Protein 8.2 (*)    All other components within normal limits  CBC  TROPONIN I  BRAIN NATRIURETIC PEPTIDE  TROPONIN I    EKG  EKG Interpretation  Date/Time:  Thursday May 17 2016 19:20:27 EDT Ventricular Rate:  106 PR Interval:    QRS Duration: 89 QT Interval:  340 QTC Calculation: 452 R Axis:   53 Text Interpretation:  Sinus tachycardia Low voltage, precordial leads Borderline T abnormalities, anterior leads Confirmed by 04-[birth year]1970, MD, Donnald Garre [phone]) on 05/17/2016 7:38:54 PM       Radiology No results found.  Procedures Procedures (including critical care time)  Medications Ordered in ED Medications  cyclobenzaprine (FLEXERIL) tablet 5 mg (5 mg Oral Given 05/17/16 2311)     Initial Impression / Assessment and Plan / ED Course  I have reviewed the triage vital signs and the nursing notes.  Pertinent labs & imaging results that  were available during my care of the patient were reviewed by me and considered in my medical decision making (see chart for details).      Final Clinical Impressions(s) / ED Diagnoses   Final diagnoses:  Precordial pain  Trapezius muscle spasm  Chest wall pain   Patient's reported pain and physical examination are consistent with muscle skeletal pain. She has very reproducible tenderness to palpation along the trapezius and chest wall. Pain has been present for 2 days now. The patient be treated with Flexeril for muscular spasm. Although patient does have significant medical comorbidity, these all appear to be stable. There is no new lower extremity swelling, patient is at baseline. BNP does not show elevation and chest x-ray  is clear. Precautionary statements reviewed regarding ischemic type symptoms however at this time I feel patient is stable for outpatient management and follow-up. New Prescriptions Discharge Medication List as of 05/17/2016 10:52 PM    START taking these medications   Details  cyclobenzaprine (FLEXERIL) 10 MG tablet Take 1 tablet (10 mg total) by mouth 3 (three) times daily as needed for muscle spasms., Starting Thu 05/17/2016, Print           Arby Barrette, MD 05/26/16 1324

## 2016-05-17 NOTE — ED Notes (Signed)
Pt verbalizes understanding of d/c instructions and denies any further needs at this time. 

## 2016-05-17 NOTE — ED Notes (Signed)
Pt returned from xray

## 2016-05-17 NOTE — ED Triage Notes (Signed)
Patient states that she developed left chest pain two days ago.  States the pain is associated with sob, diaphoresis, dizziness and nausea.  States she has a history of CHF and has gained 11 pounds in the last two days.

## 2016-06-17 ENCOUNTER — Encounter (HOSPITAL_BASED_OUTPATIENT_CLINIC_OR_DEPARTMENT_OTHER): Payer: Self-pay | Admitting: *Deleted

## 2016-06-17 ENCOUNTER — Emergency Department (HOSPITAL_BASED_OUTPATIENT_CLINIC_OR_DEPARTMENT_OTHER): Payer: 59

## 2016-06-17 ENCOUNTER — Emergency Department (HOSPITAL_BASED_OUTPATIENT_CLINIC_OR_DEPARTMENT_OTHER)
Admission: EM | Admit: 2016-06-17 | Discharge: 2016-06-17 | Disposition: A | Discharge status: Home / Self Care | Payer: 59 | Attending: Emergency Medicine | Admitting: Emergency Medicine

## 2016-06-17 DIAGNOSIS — J209 Acute bronchitis, unspecified: Secondary | ICD-10-CM | POA: Diagnosis not present

## 2016-06-17 DIAGNOSIS — I509 Heart failure, unspecified: Secondary | ICD-10-CM | POA: Insufficient documentation

## 2016-06-17 DIAGNOSIS — Z859 Personal history of malignant neoplasm, unspecified: Secondary | ICD-10-CM | POA: Insufficient documentation

## 2016-06-17 DIAGNOSIS — Z7982 Long term (current) use of aspirin: Secondary | ICD-10-CM | POA: Insufficient documentation

## 2016-06-17 DIAGNOSIS — R0602 Shortness of breath: Secondary | ICD-10-CM | POA: Diagnosis present

## 2016-06-17 DIAGNOSIS — Z79899 Other long term (current) drug therapy: Secondary | ICD-10-CM | POA: Insufficient documentation

## 2016-06-17 DIAGNOSIS — I110 Hypertensive heart disease with heart failure: Secondary | ICD-10-CM | POA: Diagnosis not present

## 2016-06-17 LAB — BASIC METABOLIC PANEL
ANION GAP: 8 (ref 5–15)
BUN: 14 mg/dL (ref 6–20)
CO2: 26 mmol/L (ref 22–32)
Calcium: 9.3 mg/dL (ref 8.9–10.3)
Chloride: 104 mmol/L (ref 101–111)
Creatinine, Ser: 0.9 mg/dL (ref 0.44–1.00)
Glucose, Bld: 111 mg/dL — ABNORMAL HIGH (ref 65–99)
POTASSIUM: 4 mmol/L (ref 3.5–5.1)
SODIUM: 138 mmol/L (ref 135–145)

## 2016-06-17 LAB — CBC WITH DIFFERENTIAL/PLATELET
BASOS ABS: 0 10*3/uL (ref 0.0–0.1)
Basophils Relative: 0 %
EOS ABS: 0.2 10*3/uL (ref 0.0–0.7)
EOS PCT: 3 %
HCT: 36.5 % (ref 36.0–46.0)
Hemoglobin: 11.7 g/dL — ABNORMAL LOW (ref 12.0–15.0)
Lymphocytes Relative: 39 %
Lymphs Abs: 2.3 10*3/uL (ref 0.7–4.0)
MCH: 29.6 pg (ref 26.0–34.0)
MCHC: 32.1 g/dL (ref 30.0–36.0)
MCV: 92.4 fL (ref 78.0–100.0)
Monocytes Absolute: 0.5 10*3/uL (ref 0.1–1.0)
Monocytes Relative: 8 %
NEUTROS PCT: 50 %
Neutro Abs: 3.1 10*3/uL (ref 1.7–7.7)
PLATELETS: 275 10*3/uL (ref 150–400)
RBC: 3.95 MIL/uL (ref 3.87–5.11)
RDW: 13.8 % (ref 11.5–15.5)
WBC: 6 10*3/uL (ref 4.0–10.5)

## 2016-06-17 LAB — TROPONIN I

## 2016-06-17 LAB — BRAIN NATRIURETIC PEPTIDE: B NATRIURETIC PEPTIDE 5: 14.1 pg/mL (ref 0.0–100.0)

## 2016-06-17 MED ORDER — DEXAMETHASONE SODIUM PHOSPHATE 10 MG/ML IJ SOLN
10.0000 mg | Freq: Once | INTRAMUSCULAR | Status: AC
  Administered 2016-06-17: 10 mg via INTRAVENOUS
  Filled 2016-06-17: qty 1

## 2016-06-17 MED ORDER — ALBUTEROL SULFATE (2.5 MG/3ML) 0.083% IN NEBU
5.0000 mg | INHALATION_SOLUTION | Freq: Once | RESPIRATORY_TRACT | Status: AC
  Administered 2016-06-17: 5 mg via RESPIRATORY_TRACT
  Filled 2016-06-17: qty 6

## 2016-06-17 MED ORDER — ALBUTEROL SULFATE (2.5 MG/3ML) 0.083% IN NEBU
2.5000 mg | INHALATION_SOLUTION | Freq: Once | RESPIRATORY_TRACT | Status: AC
  Administered 2016-06-17: 2.5 mg via RESPIRATORY_TRACT
  Filled 2016-06-17: qty 3

## 2016-06-17 MED ORDER — IPRATROPIUM-ALBUTEROL 0.5-2.5 (3) MG/3ML IN SOLN
3.0000 mL | Freq: Once | RESPIRATORY_TRACT | Status: AC
  Administered 2016-06-17: 3 mL via RESPIRATORY_TRACT
  Filled 2016-06-17: qty 3

## 2016-06-17 MED ORDER — DEXTROSE 5 % IV SOLN
1.0000 g | Freq: Once | INTRAVENOUS | Status: AC
  Administered 2016-06-17: 1 g via INTRAVENOUS
  Filled 2016-06-17: qty 10

## 2016-06-17 MED ORDER — AZITHROMYCIN 250 MG PO TABS: ORAL_TABLET | ORAL | 0 refills | Status: DC

## 2016-06-17 MED ORDER — ALBUTEROL SULFATE HFA 108 (90 BASE) MCG/ACT IN AERS
2.0000 | INHALATION_SPRAY | RESPIRATORY_TRACT | Status: DC | PRN
  Administered 2016-06-17: 2 via RESPIRATORY_TRACT
  Filled 2016-06-17: qty 6.7

## 2016-06-17 NOTE — ED Provider Notes (Signed)
[facility] Provider Note: Lowella Dell, MD, FACEP  CSN: [id number] MRN: [id number] ARRIVAL: 06/17/16 at 0405 ROOM: [facility]   CHIEF COMPLAINT  Shortness of Breath   HISTORY OF PRESENT ILLNESS  Melinda Woods is a 51 y.o. female with a history of diastolic congestive heart failure. She is here with three-day history of worsening shortness of breath. Shortness of breath is been accompanied by a cough, hoarse voice and sore throat. She is having difficulty taking deep breaths. She states this feels different than her usual CHF exacerbations. She is having some soreness in her left anterior chest when she coughs. She denies fever, nausea, vomiting or diarrhea. She has not been taking any over-the-counter medications for her symptoms. She does have some edema in her lower legs which comes and goes to due to her CHF. She has had a recent sick contact (nephew, possible whooping cough).   Past Medical History:  Diagnosis Date  . Anxiety   . Cancer (HCC)   . CHF (congestive heart failure) (HCC)   . Fatty liver   . Hypertension   . Neuropathy   . Ovarian cyst     Past Surgical History:  Procedure Laterality Date  . TUBAL LIGATION      No family history on file.  Social History  Substance Use Topics  . Smoking status: Never Smoker  . Smokeless tobacco: Never Used  . Alcohol use No    Prior to Admission medications   Medication Sig Start Date End Date Taking? Authorizing Provider  amitriptyline (ELAVIL) 100 MG tablet Take 1 tablet (100 mg total) by mouth 2 (two) times daily. Patient taking differently: Take 100 mg by mouth 3 (three) times daily.  04/26/16   Calvert Cantor, MD  aspirin EC 81 MG tablet Take 81 mg by mouth daily.    Historical Provider, MD  azithromycin (ZITHROMAX) 250 MG tablet Take first 2 tablets today, then 1 every day until finished. 06/17/16   Etty Isaac, MD  cyclobenzaprine (FLEXERIL) 10 MG tablet Take 1 tablet (10 mg total) by mouth 3 (three)  times daily as needed for muscle spasms. 05/17/16   Arby Barrette, MD  furosemide (LASIX) 40 MG tablet Take 1 tablet (40 mg total) by mouth daily as needed for fluid (see instructions on d/c summary). 04/26/16   Calvert Cantor, MD  losartan (COZAAR) 25 MG tablet Take 1 tablet (25 mg total) by mouth daily. 04/26/16   Calvert Cantor, MD  metoprolol tartrate (LOPRESSOR) 25 MG tablet Take 1 tablet (25 mg total) by mouth 2 (two) times daily. 04/26/16   Calvert Cantor, MD  polyethylene glycol (MIRALAX / GLYCOLAX) packet Take 17 g by mouth daily as needed for mild constipation. 04/26/16   Calvert Cantor, MD  potassium chloride SA (K-DUR,KLOR-CON) 20 MEQ tablet Take 1 tablet (20 mEq total) by mouth daily as needed (always take with the Furosemide). 04/26/16   Calvert Cantor, MD    Allergies Oxycodone base and Latex   REVIEW OF SYSTEMS  Negative except as noted here or in the History of Present Illness.   PHYSICAL EXAMINATION  Initial Vital Signs Blood pressure 114/76, pulse (!) 104, temperature 98.4 F (36.9 C), temperature source Oral, resp. rate (!) 30, height 5\' 5"  (1.651 m), weight 250 lb (113.4 kg), last menstrual period 06/10/2016, SpO2 98 %.  Examination General: Well-developed, obese female in no acute distress; appearance consistent with age of record HENT: normocephalic; atraumatic; dysphonia; no pharyngeal erythema or exudate Eyes: pupils equal, round and  reactive to light; extraocular muscles intact Neck: supple Heart: regular rate and rhythm; tachycardia Lungs: Shallow breaths with coughing on attempted deep inspiration Abdomen: soft; nondistended; nontender; bowel sounds present Extremities: No deformity; full range of motion; pulses normal; trace edema of lower legs Neurologic: Awake, alert and oriented; motor function intact in all extremities and symmetric; no facial droop Skin: Warm and dry Psychiatric: Anxious   RESULTS  Summary of this visit's results, reviewed by myself:   EKG  Interpretation  Date/Time:  Sunday June 17 2016 04:17:20 EDT Ventricular Rate:  109 PR Interval:    QRS Duration: 93 QT Interval:  345 QTC Calculation: 465 R Axis:   49 Text Interpretation:  Sinus tachycardia No significant change was found Confirmed by Asante Blanda  MD, Jonny Ruiz ([id number]) on 06/17/2016 4:20:23 AM      Laboratory Studies: Results for orders placed or performed during the hospital encounter of 06/17/16 (from the past 24 hour(s))  CBC with Differential/Platelet     Status: Abnormal   Collection Time: 06/17/16  4:41 AM  Result Value Ref Range   WBC 6.0 4.0 - 10.5 K/uL   RBC 3.95 3.87 - 5.11 MIL/uL   Hemoglobin 11.7 (L) 12.0 - 15.0 g/dL   HCT 73.2 20.2 - 54.2 %   MCV 92.4 78.0 - 100.0 fL   MCH 29.6 26.0 - 34.0 pg   MCHC 32.1 30.0 - 36.0 g/dL   RDW 70.6 23.7 - 62.8 %   Platelets 275 150 - 400 K/uL   Neutrophils Relative % 50 %   Neutro Abs 3.1 1.7 - 7.7 K/uL   Lymphocytes Relative 39 %   Lymphs Abs 2.3 0.7 - 4.0 K/uL   Monocytes Relative 8 %   Monocytes Absolute 0.5 0.1 - 1.0 K/uL   Eosinophils Relative 3 %   Eosinophils Absolute 0.2 0.0 - 0.7 K/uL   Basophils Relative 0 %   Basophils Absolute 0.0 0.0 - 0.1 K/uL  Basic metabolic panel     Status: Abnormal   Collection Time: 06/17/16  4:41 AM  Result Value Ref Range   Sodium 138 135 - 145 mmol/L   Potassium 4.0 3.5 - 5.1 mmol/L   Chloride 104 101 - 111 mmol/L   CO2 26 22 - 32 mmol/L   Glucose, Bld 111 (H) 65 - 99 mg/dL   BUN 14 6 - 20 mg/dL   Creatinine, Ser 3.15 0.44 - 1.00 mg/dL   Calcium 9.3 8.9 - 17.6 mg/dL   GFR calc non Af Amer >60 >60 mL/min   GFR calc Af Amer >60 >60 mL/min   Anion gap 8 5 - 15  Brain natriuretic peptide     Status: None   Collection Time: 06/17/16  4:41 AM  Result Value Ref Range   B Natriuretic Peptide 14.1 0.0 - 100.0 pg/mL  Troponin I     Status: None   Collection Time: 06/17/16  4:41 AM  Result Value Ref Range   Troponin I <0.03 <0.03 ng/mL   Imaging Studies: Dg Chest 2  View  Result Date: 06/17/2016 CLINICAL DATA:  Acute onset of shortness of breath and sore throat. Initial encounter. EXAM: CHEST  2 VIEW COMPARISON:  Chest radiograph performed 05/17/2016 FINDINGS: There is mild elevation of the right hemidiaphragm. Mild left basilar airspace opacity raises concern for pneumonia. There is no evidence of pleural effusion or pneumothorax. The heart is normal in size; the mediastinal contour is within normal limits. No acute osseous abnormalities are seen. IMPRESSION: Mild elevation of  the right hemidiaphragm. Mild left basilar airspace opacity raises concern for pneumonia. Electronically Signed   By: Roanna Raider M.D.   On: 06/17/2016 05:02    ED COURSE  Nursing notes and initial vitals signs, including pulse oximetry, reviewed.  Vitals:   06/17/16 0430 06/17/16 0434 06/17/16 0528 06/17/16 0547  BP: (!) 98/54     Pulse: 96     Resp: 19     Temp:      TempSrc:      SpO2: 94% 95% 90% 90%  Weight:      Height:       4:50 AM Improved air movement, decreased coughing after albuterol neb treatment.  5:42 AM Further improvement in air movement and decreased dyspnea after second albuterol treatment. Dexamethasone 10 milligrams given IV. For possible pneumonia Rocephin one gram IV ordered. Will provide with an inhaler and AeroChamber and instruct her in its use. Will send home with Zithromax as patient may have been exposed to whooping cough recently.  6:28 AM Patient resting comfortably. Oxygen saturation 93% on room air. Rocephin infusing.  PROCEDURES    ED DIAGNOSES     ICD-9-CM ICD-10-CM   1. Acute bronchitis with bronchospasm 466.0 J20.9        Paula Libra, MD 06/17/16 [phone]

## 2016-06-17 NOTE — ED Notes (Signed)
Returned from xray

## 2016-06-17 NOTE — ED Notes (Signed)
MD with pt  

## 2016-06-17 NOTE — ED Notes (Signed)
2nd breathing treatment in progress

## 2016-06-17 NOTE — ED Notes (Signed)
transported to xray 

## 2016-06-17 NOTE — ED Notes (Signed)
ST on monitor

## 2016-06-17 NOTE — ED Triage Notes (Addendum)
Pt c/o of not feeling well since Thursday. States she has been around her sick nephew. c/o of throat hurting. Denies fevers. Hx of chf but states these symptoms are different. Dry cough. Voice is hoarse. Audible wheezing noted on exam. c/o of feeling a general sob. No distress on exam. resp even and slightly labored. MD to room on arrival. c/o anterior chest tightness with breathing or movement.

## 2016-08-01 ENCOUNTER — Encounter (HOSPITAL_BASED_OUTPATIENT_CLINIC_OR_DEPARTMENT_OTHER): Payer: Self-pay | Admitting: Emergency Medicine

## 2016-08-01 ENCOUNTER — Emergency Department (HOSPITAL_BASED_OUTPATIENT_CLINIC_OR_DEPARTMENT_OTHER)
Admission: EM | Admit: 2016-08-01 | Discharge: 2016-08-01 | Disposition: A | Discharge status: Home / Self Care | Payer: 59 | Attending: Emergency Medicine | Admitting: Emergency Medicine

## 2016-08-01 ENCOUNTER — Emergency Department (HOSPITAL_BASED_OUTPATIENT_CLINIC_OR_DEPARTMENT_OTHER): Payer: 59

## 2016-08-01 DIAGNOSIS — I509 Heart failure, unspecified: Secondary | ICD-10-CM | POA: Insufficient documentation

## 2016-08-01 DIAGNOSIS — Z79899 Other long term (current) drug therapy: Secondary | ICD-10-CM | POA: Insufficient documentation

## 2016-08-01 DIAGNOSIS — I110 Hypertensive heart disease with heart failure: Secondary | ICD-10-CM | POA: Insufficient documentation

## 2016-08-01 DIAGNOSIS — R0789 Other chest pain: Secondary | ICD-10-CM | POA: Diagnosis not present

## 2016-08-01 DIAGNOSIS — R079 Chest pain, unspecified: Secondary | ICD-10-CM | POA: Diagnosis present

## 2016-08-01 LAB — BASIC METABOLIC PANEL
ANION GAP: 8 (ref 5–15)
BUN: 12 mg/dL (ref 6–20)
CHLORIDE: 100 mmol/L — AB (ref 101–111)
CO2: 30 mmol/L (ref 22–32)
Calcium: 9.3 mg/dL (ref 8.9–10.3)
Creatinine, Ser: 0.99 mg/dL (ref 0.44–1.00)
GFR calc non Af Amer: >60 mL/min (ref >60)
GLUCOSE: 94 mg/dL (ref 65–99)
Potassium: 4.1 mmol/L (ref 3.5–5.1)
Sodium: 138 mmol/L (ref 135–145)

## 2016-08-01 LAB — CBC
HCT: 37.4 % (ref 36.0–46.0)
HEMOGLOBIN: 12.1 g/dL (ref 12.0–15.0)
MCH: 29.5 pg (ref 26.0–34.0)
MCHC: 32.4 g/dL (ref 30.0–36.0)
MCV: 91.2 fL (ref 78.0–100.0)
Platelets: 259 10*3/uL (ref 150–400)
RBC: 4.1 MIL/uL (ref 3.87–5.11)
RDW: 13.5 % (ref 11.5–15.5)
WBC: 5.6 10*3/uL (ref 4.0–10.5)

## 2016-08-01 LAB — TROPONIN I: Troponin I: <0.03 ng/mL (ref <0.03)

## 2016-08-01 MED ORDER — HYDROCODONE-ACETAMINOPHEN 5-325 MG PO TABS
1.0000 | ORAL_TABLET | Freq: Once | ORAL | Status: AC
  Administered 2016-08-01: 1 via ORAL
  Filled 2016-08-01: qty 1

## 2016-08-01 MED ORDER — HYDROCODONE-ACETAMINOPHEN 5-325 MG PO TABS: 1.0000 | ORAL_TABLET | Freq: Four times a day (QID) | ORAL | 0 refills | Status: DC | PRN

## 2016-08-01 NOTE — ED Notes (Signed)
Patient transported to X-ray 

## 2016-08-01 NOTE — ED Notes (Signed)
ED Provider at bedside. 

## 2016-08-01 NOTE — ED Provider Notes (Signed)
[facility] Provider Note   CSN: [id number] Arrival date & time: 08/01/16  1957  By signing my name below, I, Rosario Adie, attest that this documentation has been prepared under the direction and in the presence of Vanetta Mulders, MD. Electronically Signed: Rosario Adie, ED Scribe. 08/01/16. 10:17 PM.  History   Chief Complaint Chief Complaint  Patient presents with  . Chest Pain   The history is provided by the patient. No language interpreter was used.   HPI Comments: Melinda Woods is a 51 y.o. female with a PMHx of CHF and obesity, who presents to the Emergency Department complaining of constant, 9/10 left-sided, upper chest pain beginning yesterday. She reports radiation of her pain into her left arm. She also notes some mild shortness of breath, lower back pain. She also endorses some b/l ankle swelling which is a baseline issue for her and has not acutely changed recently. Pt has a h/o similar pain and she states that in the ED it was attributed to a muscle strain. Her pain is worse with deep inspirations. No noted treatments for her symptoms were tried prior to coming into the ED. No h/o prior MIs. Pt denies fever, chills, congestion, rhinorrhea, sore throat, visual disturbance, cough, abdominal pain, diarrhea, nausea, vomiting, dysuria, hematuria, neck pain, rash, HA, bruising/bleeding easily. Pt is not currently on anticoagulants.   Past Medical History:  Diagnosis Date  . Anxiety   . CHF (congestive heart failure) (HCC)   . Fatty liver   . Hypertension   . Neuropathy   . Ovarian cyst     Patient Active Problem List   Diagnosis Date Noted  . Acute respiratory failure (HCC) 04/26/2016  . Fatty liver 04/25/2016  . Hypertension 04/23/2016  . Morbid obesity (HCC) 04/23/2016  . Peripheral neuropathy 04/23/2016  . Acute diastolic heart failure (HCC) 04/23/2016  . Right sided weakness 03/13/2014  . TIA (transient ischemic attack) 03/13/2014    Past Surgical History:  Procedure Laterality Date  . TUBAL LIGATION     OB History    No data available     Home Medications    Prior to Admission medications   Medication Sig Start Date End Date Taking? Authorizing Provider  amitriptyline (ELAVIL) 100 MG tablet Take 1 tablet (100 mg total) by mouth 2 (two) times daily. Patient taking differently: Take 100 mg by mouth 3 (three) times daily.  04/26/16   Calvert Cantor, MD  aspirin EC 81 MG tablet Take 81 mg by mouth daily.    [provider]  azithromycin (ZITHROMAX) 250 MG tablet Take first 2 tablets today, then 1 every day until finished. 06/17/16   Molpus, John, MD  cyclobenzaprine (FLEXERIL) 10 MG tablet Take 1 tablet (10 mg total) by mouth 3 (three) times daily as needed for muscle spasms. 05/17/16   Arby Barrette, MD  furosemide (LASIX) 40 MG tablet Take 1 tablet (40 mg total) by mouth daily as needed for fluid (see instructions on d/c summary). 04/26/16   Calvert Cantor, MD  HYDROcodone-acetaminophen (NORCO/VICODIN) 5-325 MG tablet Take 1-2 tablets by mouth every 6 (six) hours as needed for moderate pain. 08/01/16   Vanetta Mulders, MD  losartan (COZAAR) 25 MG tablet Take 1 tablet (25 mg total) by mouth daily. 04/26/16   Calvert Cantor, MD  metoprolol tartrate (LOPRESSOR) 25 MG tablet Take 1 tablet (25 mg total) by mouth 2 (two) times daily. 04/26/16   Calvert Cantor, MD  polyethylene glycol (MIRALAX / GLYCOLAX) packet Take 17  g by mouth daily as needed for mild constipation. 04/26/16   Calvert Cantor, MD  potassium chloride SA (K-DUR,KLOR-CON) 20 MEQ tablet Take 1 tablet (20 mEq total) by mouth daily as needed (always take with the Furosemide). 04/26/16   Calvert Cantor, MD   Family History History reviewed. No pertinent family history.  Social History Social History  Substance Use Topics  . Smoking status: Never Smoker  . Smokeless tobacco: Never Used  . Alcohol use No   Allergies   Oxycodone base and Latex  Review of  Systems Review of Systems  Constitutional: Negative for fever.  HENT: Negative for congestion, rhinorrhea and sore throat.   Eyes: Negative for visual disturbance.  Respiratory: Positive for shortness of breath. Negative for cough and wheezing.   Cardiovascular: Positive for chest pain. Negative for leg swelling.  Gastrointestinal: Negative for abdominal pain, diarrhea, nausea and vomiting.  Genitourinary: Negative for dysuria and hematuria.  Musculoskeletal: Positive for back pain. Negative for neck pain.  Skin: Negative for rash.  Neurological: Negative for headaches.  Hematological: Does not bruise/bleed easily.   Physical Exam Updated Vital Signs BP 111/72 (BP Location: Right Arm)   Pulse 96   Temp 98.9 F (37.2 C) (Oral)   Resp 18   Ht 1.676 m (5\' 6" )   Wt 115.7 kg (255 lb)   LMP 07/01/2016   SpO2 92%   BMI 41.16 kg/m   Physical Exam  Constitutional: She is oriented to person, place, and time. She appears well-developed and well-nourished.  HENT:  Head: Normocephalic.  Mouth/Throat: Mucous membranes are normal.  Eyes: Conjunctivae and EOM are normal. Pupils are equal, round, and reactive to light. Right eye exhibits no discharge. Left eye exhibits no discharge. No scleral icterus.  Cardiovascular: Regular rhythm and normal heart sounds.  Tachycardia present.   No murmur heard. Pulses:      Radial pulses are 1+ on the left side.  Mildly tachycardic.   Pulmonary/Chest: Effort normal and breath sounds normal. No respiratory distress. She has no wheezes. She has no rales. She exhibits tenderness.  Tenderness of the left upper chest wall on palpation.   Abdominal: Soft. Bowel sounds are normal. She exhibits no distension. There is no tenderness.  Musculoskeletal: Normal range of motion. She exhibits edema.  Trace pitting edema to the b/l legs. Pain reproducible with movement of the left arm.  Neurological: She is alert and oriented to person, place, and time. No cranial  nerve deficit. She exhibits normal muscle tone. Coordination normal.  Skin: Skin is warm and dry.  Psychiatric: She has a normal mood and affect.  Nursing note and vitals reviewed.  ED Treatments / Results  DIAGNOSTIC STUDIES: Oxygen Saturation is 95% on RA, adequate by my interpretation.   COORDINATION OF CARE: 10:16 PM-Discussed next steps with pt. Pt verbalized understanding and is agreeable with the plan.   Labs (all labs ordered are listed, but only abnormal results are displayed) Labs Reviewed  BASIC METABOLIC PANEL - Abnormal; Notable for the following:       Result Value   Chloride 100 (*)    All other components within normal limits  CBC  TROPONIN I   EKG  EKG Interpretation  Date/Time:  Wednesday August 01 2016 20:09:40 EDT Ventricular Rate:  103 PR Interval:  152 QRS Duration: 80 QT Interval:  348 QTC Calculation: 455 R Axis:   40 Text Interpretation:  Sinus tachycardia Low voltage QRS Borderline ECG No significant change since last tracing Confirmed by 04-10-1980,  Randel Hargens [phone]) on 08/01/2016 10:10:17 PM      Radiology Dg Chest 2 View  Result Date: 08/01/2016 CLINICAL DATA:  Pt complains of LT to mid to left side chest - increased with inspirations. Hx of CHF, Nonsmoker - Shielded EXAM: CHEST - 2 VIEW COMPARISON:  06/17/2016 FINDINGS: Coarse prominent bibasilar interstitial markings. Relatively low lung volumes. No focal airspace disease. Heart size and mediastinal contours are within normal limits. No effusion.  No pneumothorax. Visualized bones unremarkable. IMPRESSION: 1. Low volumes with prominent bibasilar interstitial markings. Electronically Signed   By: Corlis Leak M.D.   On: 08/01/2016 21:25   Procedures Procedures   Medications Ordered in ED Medications  HYDROcodone-acetaminophen (NORCO/VICODIN) 5-325 MG per tablet 1 tablet (1 tablet Oral Given 08/01/16 2248)    Initial Impression / Assessment and Plan / ED Course  I have reviewed the triage vital signs  and the nursing notes.  Pertinent labs & imaging results that were available during my care of the patient were reviewed by me and considered in my medical decision making (see chart for details).    Patient's chest pain is clearly seems to be chest wall in nature. Reproducible pain with palpation of the left chest. As well as movement of the left shoulder. Patient's troponin was negative chest x-ray without any acute findings. Patient's had the pain nonstop since yesterday. Clinically also not concerned about pulmonary embolus. Oxygen saturations are fine. Patient does have some resting tachycardia previous visits to her heart rates pain about the same.   Final Clinical Impressions(s) / ED Diagnoses   Final diagnoses:  Chest wall pain   New Prescriptions New Prescriptions   HYDROCODONE-ACETAMINOPHEN (NORCO/VICODIN) 5-325 MG TABLET    Take 1-2 tablets by mouth every 6 (six) hours as needed for moderate pain.   I personally performed the services described in this documentation, which was scribed in my presence. The recorded information has been reviewed and is accurate.       Vanetta Mulders, MD 08/01/16 [phone]

## 2016-08-01 NOTE — ED Triage Notes (Signed)
Patient states that she has had sharp pain to her left chest  - increased with inspirations. Hx of CHF -

## 2016-08-01 NOTE — Discharge Instructions (Signed)
Chest pain seems to be coming from your left chest wall. Take the hydrocodone as directed. Follow-up with your doctor. Return for any new or worse symptoms. No evidence of any acute cardiac event.

## 2016-08-05 ENCOUNTER — Emergency Department (HOSPITAL_BASED_OUTPATIENT_CLINIC_OR_DEPARTMENT_OTHER): Payer: 59

## 2016-08-05 ENCOUNTER — Emergency Department (HOSPITAL_BASED_OUTPATIENT_CLINIC_OR_DEPARTMENT_OTHER)
Admission: EM | Admit: 2016-08-05 | Discharge: 2016-08-05 | Disposition: A | Discharge status: Home / Self Care | Payer: 59 | Attending: Emergency Medicine | Admitting: Emergency Medicine

## 2016-08-05 ENCOUNTER — Encounter (HOSPITAL_BASED_OUTPATIENT_CLINIC_OR_DEPARTMENT_OTHER): Payer: Self-pay | Admitting: Emergency Medicine

## 2016-08-05 DIAGNOSIS — R42 Dizziness and giddiness: Secondary | ICD-10-CM | POA: Diagnosis present

## 2016-08-05 DIAGNOSIS — I10 Essential (primary) hypertension: Secondary | ICD-10-CM | POA: Insufficient documentation

## 2016-08-05 DIAGNOSIS — R072 Precordial pain: Secondary | ICD-10-CM | POA: Diagnosis not present

## 2016-08-05 DIAGNOSIS — I110 Hypertensive heart disease with heart failure: Secondary | ICD-10-CM | POA: Diagnosis not present

## 2016-08-05 DIAGNOSIS — I5031 Acute diastolic (congestive) heart failure: Secondary | ICD-10-CM | POA: Insufficient documentation

## 2016-08-05 DIAGNOSIS — Z8673 Personal history of transient ischemic attack (TIA), and cerebral infarction without residual deficits: Secondary | ICD-10-CM | POA: Diagnosis not present

## 2016-08-05 LAB — BASIC METABOLIC PANEL
Anion gap: 10 (ref 5–15)
BUN: 16 mg/dL (ref 6–20)
CO2: 28 mmol/L (ref 22–32)
CREATININE: 0.97 mg/dL (ref 0.44–1.00)
Calcium: 9.5 mg/dL (ref 8.9–10.3)
Chloride: 99 mmol/L — ABNORMAL LOW (ref 101–111)
GFR calc Af Amer: >60 mL/min (ref >60)
Glucose, Bld: 99 mg/dL (ref 65–99)
Potassium: 3.6 mmol/L (ref 3.5–5.1)
SODIUM: 137 mmol/L (ref 135–145)

## 2016-08-05 LAB — CBC WITH DIFFERENTIAL/PLATELET
BASOS ABS: 0 10*3/uL (ref 0.0–0.1)
Basophils Relative: 0 %
EOS ABS: 0.1 10*3/uL (ref 0.0–0.7)
EOS PCT: 2 %
HCT: 38 % (ref 36.0–46.0)
Hemoglobin: 12.6 g/dL (ref 12.0–15.0)
LYMPHS PCT: 42 %
Lymphs Abs: 2.1 10*3/uL (ref 0.7–4.0)
MCH: 30.4 pg (ref 26.0–34.0)
MCHC: 33.2 g/dL (ref 30.0–36.0)
MCV: 91.6 fL (ref 78.0–100.0)
MONO ABS: 0.3 10*3/uL (ref 0.1–1.0)
Monocytes Relative: 6 %
Neutro Abs: 2.5 10*3/uL (ref 1.7–7.7)
Neutrophils Relative %: 50 %
PLATELETS: 253 10*3/uL (ref 150–400)
RBC: 4.15 MIL/uL (ref 3.87–5.11)
RDW: 13.8 % (ref 11.5–15.5)
WBC: 5.1 10*3/uL (ref 4.0–10.5)

## 2016-08-05 LAB — TROPONIN I: Troponin I: <0.03 ng/mL (ref <0.03)

## 2016-08-05 MED ORDER — AZITHROMYCIN 250 MG PO TABS: 250.0000 mg | ORAL_TABLET | Freq: Every day | ORAL | 0 refills | Status: DC

## 2016-08-05 NOTE — Discharge Instructions (Signed)

## 2016-08-05 NOTE — ED Triage Notes (Addendum)
Pt BIB EMS for panic attack. Versed 205 given by ems. 22g to left inner wrist placed by ems. EMS placed 2 L Rembrandt on patient for decrease sats after versed given. Pt calm on arrival to ED. Pt states she feels better from panic attack but left shoulder continues to hurt. PT states she has history of panic attacks. PIV out of vein, removed.

## 2016-08-05 NOTE — ED Provider Notes (Signed)
Emergency Department Provider Note  By signing my name below, I, Deland Pretty, attest that this documentation has been prepared under the direction and in the presence of Marymargaret Kirker, Arlyss Repress, MD. Electronically Signed: Deland Pretty, ED Scribe. 08/05/16. 4:14 PM.   I have reviewed the triage vital signs and the nursing notes.   HISTORY  Chief Complaint Panic Attack   HPI Comments: Melinda Woods is a 51 y.o. female, BIB EMS, who presents to the Emergency Department complaining of gradually resolving, moderate, persistent light-headedness that began s/p what the pt believes to be a panic attack that occurred today. The pt has associated SOB, heart palpitations, and tremors. The pt also complains of left shoulder pain that began on Wednesday 08/01/2016). She indicates that her current symptoms feel similar to previous panic attacks. 22g of left inner wrist medication was given by EMS. The pt has a PMHx of panic attacks. No radiation of pain. No modifying factors.   Past Medical History:  Diagnosis Date  . Anxiety   . CHF (congestive heart failure) (HCC)   . Fatty liver   . Hypertension   . Neuropathy   . Ovarian cyst     Patient Active Problem List   Diagnosis Date Noted  . Acute respiratory failure (HCC) 04/26/2016  . Fatty liver 04/25/2016  . Hypertension 04/23/2016  . Morbid obesity (HCC) 04/23/2016  . Peripheral neuropathy 04/23/2016  . Acute diastolic heart failure (HCC) 04/23/2016  . Right sided weakness 03/13/2014  . TIA (transient ischemic attack) 03/13/2014    Past Surgical History:  Procedure Laterality Date  . TUBAL LIGATION      Current Outpatient Rx  . Order #: [id number] Class: No Print  . Order #: [id number] Class: Historical Med  . Order #: [id number] Class: Print  . Order #: [id number] Class: Print  . Order #: [id number] Class: Print  . Order #: [id number] Class: Print  . Order #: [id number] Class: Print  . Order #: [id number] Class: Normal  . Order #:  [id number] Class: Normal  . Order #: [id number] Class: Normal    Allergies Oxycodone base and Latex  No family history on file.  Social History Social History  Substance Use Topics  . Smoking status: Never Smoker  . Smokeless tobacco: Never Used  . Alcohol use No    Review of Systems  Constitutional: No fever/chills Eyes: No visual changes. ENT: No sore throat. Cardiovascular: Positive chest pain. Respiratory: Denies shortness of breath. Gastrointestinal: No abdominal pain.  No nausea, no vomiting.  No diarrhea.  No constipation. Genitourinary: Negative for dysuria. Musculoskeletal: Negative for back pain. Positive left shoulder pain.  Skin: Negative for rash. Neurological: Negative for headaches, focal weakness or numbness. Positive for light-headedness.  10-point ROS otherwise negative.  ____________________________________________   PHYSICAL EXAM:  VITAL SIGNS: ED Triage Vitals  Enc Vitals Group     BP 08/05/16 1454 109/68     Pulse Rate 08/05/16 1454 (!) 116     Resp 08/05/16 1454 (!) 24     SpO2 08/05/16 1454 92 %     Pain Score 08/05/16 1456 9   Constitutional: Alert and oriented. Well appearing and in no acute distress. Eyes: Conjunctivae are normal.  Head: Atraumatic. Nose: No congestion/rhinnorhea. Mouth/Throat: Mucous membranes are moist.  Oropharynx non-erythematous. Neck: No stridor.  Cardiovascular: Normal rate, regular rhythm. Good peripheral circulation. Grossly normal heart sounds.   Respiratory: Normal respiratory effort.  No retractions. Lungs CTAB. Gastrointestinal: Soft and nontender. No distention.  Musculoskeletal: No lower extremity tenderness nor edema. No gross deformities of extremities.  Decreased ROM of the left shoulder with some discomfort with movement.  Neurologic:  Normal speech and language. No gross focal neurologic deficits are appreciated.  Skin:  Skin is warm, dry and intact. No rash noted. Psychiatric: Mood and affect are  normal. Speech and behavior are normal.  ____________________________________________  DIAGNOSTIC STUDIES: Oxygen Saturation is 92% on RA, low by my interpretation.   COORDINATION OF CARE: 3:47 PM-Discussed next steps with pt. Pt verbalized understanding and is agreeable with the plan.     LABS (all labs ordered are listed, but only abnormal results are displayed)  Labs Reviewed  BASIC METABOLIC PANEL - Abnormal; Notable for the following:       Result Value   Chloride 99 (*)    All other components within normal limits  CBC WITH DIFFERENTIAL/PLATELET  TROPONIN I   ____________________________________________  EKG   EKG Interpretation  Date/Time:  Sunday August 05 2016 16:03:43 EDT Ventricular Rate:  99 PR Interval:    QRS Duration: 89 QT Interval:  339 QTC Calculation: 435 R Axis:   46 Text Interpretation:  Sinus rhythm Borderline T wave abnormalities No STEMI. Similar to prior.  Confirmed by Alona Bene [phone]) on 08/05/2016 4:05:40 PM       ____________________________________________  RADIOLOGY  Dg Chest 2 View  Result Date: 08/05/2016 CLINICAL DATA:  Left chest pain x4 days, respiratory difficulty EXAM: CHEST  2 VIEW COMPARISON:  08/01/2016 FINDINGS: Mild patchy opacities in the right upper lobe and left lower lobe, new. No pleural effusion or pneumothorax. The heart is normal in size. Visualized osseous structures are within normal limits. IMPRESSION: Mild patchy opacities in the right upper lobe and left lower lobe, new, possibly reflecting pneumonia. Electronically Signed   By: Charline Bills M.D.   On: 08/05/2016 16:58    ____________________________________________   PROCEDURES  Procedure(s) performed:   Procedures  None ____________________________________________   INITIAL IMPRESSION / ASSESSMENT AND PLAN / ED COURSE  Pertinent labs & imaging results that were available during my care of the patient were reviewed by me and considered in  my medical decision making (see chart for details).  Patient presents to the emergency department for evaluation of chest pain and left shoulder pain in the setting of panic attack symptoms started roughly this afternoon. She's had shoulder pain for several days along with some chest discomfort. EKG is similar to prior tracings with no acute ischemia. Plan for x-ray, labs, troponin, reassess. Patient has Melinda Woods history of panic attacks and states this feels similar to prior episodes. She received Versed with EMS and is feeling better.  Labs including troponin are normal. Patient feeling better. Plan for PCP follow up.   At this time, I do not feel there is any life-threatening condition present. I have reviewed and discussed all results (EKG, imaging, lab, urine as appropriate), exam findings with patient. I have reviewed nursing notes and appropriate previous records.  I feel the patient is safe to be discharged home without further emergent workup. Discussed usual and customary return precautions. Patient and family (if present) verbalize understanding and are comfortable with this plan.  Patient will follow-up with their primary care provider. If they do not have a primary care provider, information for follow-up has been provided to them. All questions have been answered.  ____________________________________________  FINAL CLINICAL IMPRESSION(S) / ED DIAGNOSES  Final diagnoses:  Precordial chest pain     MEDICATIONS GIVEN DURING THIS VISIT:  Medications - No data to display   NEW OUTPATIENT  MEDICATIONS STARTED DURING THIS VISIT:  None   I personally performed the services described in this documentation, which was scribed in my presence. The recorded information has been reviewed and is accurate.   Note:  This document was prepared using Dragon voice recognition software and may include unintentional dictation errors.  Alona Bene, MD Emergency Medicine    Nozomi Mettler, Arlyss Repress,  MD 08/06/16 [phone]

## 2017-01-29 ENCOUNTER — Emergency Department (HOSPITAL_BASED_OUTPATIENT_CLINIC_OR_DEPARTMENT_OTHER): Payer: 59

## 2017-01-29 ENCOUNTER — Other Ambulatory Visit: Payer: Self-pay

## 2017-01-29 ENCOUNTER — Inpatient Hospital Stay (HOSPITAL_BASED_OUTPATIENT_CLINIC_OR_DEPARTMENT_OTHER)
Admission: EM | Admit: 2017-01-29 | Discharge: 2017-02-02 | DRG: 202 | Disposition: A | Discharge status: Home / Self Care | Payer: 59 | Attending: Internal Medicine | Admitting: Internal Medicine

## 2017-01-29 ENCOUNTER — Encounter (HOSPITAL_BASED_OUTPATIENT_CLINIC_OR_DEPARTMENT_OTHER): Payer: Self-pay | Admitting: Emergency Medicine

## 2017-01-29 DIAGNOSIS — J4551 Severe persistent asthma with (acute) exacerbation: Secondary | ICD-10-CM | POA: Diagnosis not present

## 2017-01-29 DIAGNOSIS — I5032 Chronic diastolic (congestive) heart failure: Secondary | ICD-10-CM | POA: Diagnosis present

## 2017-01-29 DIAGNOSIS — G459 Transient cerebral ischemic attack, unspecified: Secondary | ICD-10-CM | POA: Diagnosis present

## 2017-01-29 DIAGNOSIS — J209 Acute bronchitis, unspecified: Secondary | ICD-10-CM | POA: Diagnosis present

## 2017-01-29 DIAGNOSIS — I110 Hypertensive heart disease with heart failure: Secondary | ICD-10-CM | POA: Diagnosis present

## 2017-01-29 DIAGNOSIS — J441 Chronic obstructive pulmonary disease with (acute) exacerbation: Secondary | ICD-10-CM | POA: Diagnosis present

## 2017-01-29 DIAGNOSIS — Z09 Encounter for follow-up examination after completed treatment for conditions other than malignant neoplasm: Secondary | ICD-10-CM

## 2017-01-29 DIAGNOSIS — I10 Essential (primary) hypertension: Secondary | ICD-10-CM | POA: Diagnosis not present

## 2017-01-29 DIAGNOSIS — Z801 Family history of malignant neoplasm of trachea, bronchus and lung: Secondary | ICD-10-CM

## 2017-01-29 DIAGNOSIS — Z833 Family history of diabetes mellitus: Secondary | ICD-10-CM | POA: Diagnosis not present

## 2017-01-29 DIAGNOSIS — R6510 Systemic inflammatory response syndrome (SIRS) of non-infectious origin without acute organ dysfunction: Secondary | ICD-10-CM | POA: Diagnosis not present

## 2017-01-29 DIAGNOSIS — Z87891 Personal history of nicotine dependence: Secondary | ICD-10-CM

## 2017-01-29 DIAGNOSIS — Z8673 Personal history of transient ischemic attack (TIA), and cerebral infarction without residual deficits: Secondary | ICD-10-CM | POA: Diagnosis not present

## 2017-01-29 DIAGNOSIS — F329 Major depressive disorder, single episode, unspecified: Secondary | ICD-10-CM | POA: Diagnosis present

## 2017-01-29 DIAGNOSIS — G629 Polyneuropathy, unspecified: Secondary | ICD-10-CM | POA: Diagnosis present

## 2017-01-29 DIAGNOSIS — E872 Acidosis: Secondary | ICD-10-CM | POA: Diagnosis present

## 2017-01-29 DIAGNOSIS — M069 Rheumatoid arthritis, unspecified: Secondary | ICD-10-CM | POA: Diagnosis present

## 2017-01-29 DIAGNOSIS — F419 Anxiety disorder, unspecified: Secondary | ICD-10-CM | POA: Diagnosis present

## 2017-01-29 DIAGNOSIS — J440 Chronic obstructive pulmonary disease with acute lower respiratory infection: Secondary | ICD-10-CM | POA: Diagnosis present

## 2017-01-29 DIAGNOSIS — E860 Dehydration: Secondary | ICD-10-CM | POA: Diagnosis present

## 2017-01-29 DIAGNOSIS — R000 Tachycardia, unspecified: Secondary | ICD-10-CM | POA: Diagnosis present

## 2017-01-29 DIAGNOSIS — Z7982 Long term (current) use of aspirin: Secondary | ICD-10-CM

## 2017-01-29 DIAGNOSIS — Z6841 Body Mass Index (BMI) 40.0 and over, adult: Secondary | ICD-10-CM | POA: Diagnosis not present

## 2017-01-29 DIAGNOSIS — K760 Fatty (change of) liver, not elsewhere classified: Secondary | ICD-10-CM | POA: Diagnosis present

## 2017-01-29 DIAGNOSIS — J205 Acute bronchitis due to respiratory syncytial virus: Principal | ICD-10-CM | POA: Diagnosis present

## 2017-01-29 DIAGNOSIS — Z823 Family history of stroke: Secondary | ICD-10-CM

## 2017-01-29 DIAGNOSIS — F32A Depression, unspecified: Secondary | ICD-10-CM | POA: Diagnosis present

## 2017-01-29 HISTORY — DX: Rheumatoid arthritis, unspecified: M06.9

## 2017-01-29 LAB — BASIC METABOLIC PANEL
ANION GAP: 7 (ref 5–15)
BUN: 11 mg/dL (ref 6–20)
CHLORIDE: 100 mmol/L — AB (ref 101–111)
CO2: 27 mmol/L (ref 22–32)
Calcium: 9.1 mg/dL (ref 8.9–10.3)
Creatinine, Ser: 0.82 mg/dL (ref 0.44–1.00)
GFR calc Af Amer: >60 mL/min (ref >60)
GLUCOSE: 102 mg/dL — AB (ref 65–99)
POTASSIUM: 3.4 mmol/L — AB (ref 3.5–5.1)
Sodium: 134 mmol/L — ABNORMAL LOW (ref 135–145)

## 2017-01-29 LAB — BRAIN NATRIURETIC PEPTIDE: B NATRIURETIC PEPTIDE 5: 11.9 pg/mL (ref 0.0–100.0)

## 2017-01-29 LAB — CBC
HCT: 37.4 % (ref 36.0–46.0)
HEMOGLOBIN: 12.4 g/dL (ref 12.0–15.0)
MCH: 31.2 pg (ref 26.0–34.0)
MCHC: 33.2 g/dL (ref 30.0–36.0)
MCV: 94.2 fL (ref 78.0–100.0)
Platelets: 228 10*3/uL (ref 150–400)
RBC: 3.97 MIL/uL (ref 3.87–5.11)
RDW: 14.2 % (ref 11.5–15.5)
WBC: 4.4 10*3/uL (ref 4.0–10.5)

## 2017-01-29 LAB — TROPONIN I: Troponin I: <0.03 ng/mL (ref <0.03)

## 2017-01-29 MED ORDER — METHOTREXATE 2.5 MG PO TABS: 15.0000 mg | ORAL_TABLET | ORAL | Status: DC

## 2017-01-29 MED ORDER — CELECOXIB 200 MG PO CAPS
200.0000 mg | ORAL_CAPSULE | Freq: Every day | ORAL | Status: DC
  Filled 2017-01-29 (×4): qty 1

## 2017-01-29 MED ORDER — MAGNESIUM SULFATE 2 GM/50ML IV SOLN
2.0000 g | Freq: Once | INTRAVENOUS | Status: AC
  Administered 2017-01-29: 2 g via INTRAVENOUS
  Filled 2017-01-29: qty 50

## 2017-01-29 MED ORDER — LORAZEPAM 2 MG/ML IJ SOLN: 0.5000 mg | Freq: Once | INTRAMUSCULAR | Status: DC

## 2017-01-29 MED ORDER — METHYLPREDNISOLONE SODIUM SUCC 125 MG IJ SOLR
125.0000 mg | Freq: Once | INTRAMUSCULAR | Status: AC
  Administered 2017-01-29: 125 mg via INTRAVENOUS
  Filled 2017-01-29: qty 2

## 2017-01-29 MED ORDER — METOPROLOL TARTRATE 25 MG PO TABS
25.0000 mg | ORAL_TABLET | Freq: Two times a day (BID) | ORAL | Status: DC
  Administered 2017-01-29 – 2017-02-02 (×8): 25 mg via ORAL
  Filled 2017-01-29 (×9): qty 1

## 2017-01-29 MED ORDER — ZOLPIDEM TARTRATE 5 MG PO TABS: 5.0000 mg | ORAL_TABLET | Freq: Every evening | ORAL | Status: DC | PRN

## 2017-01-29 MED ORDER — ACETAMINOPHEN 325 MG PO TABS: 650.0000 mg | ORAL_TABLET | Freq: Four times a day (QID) | ORAL | Status: DC | PRN

## 2017-01-29 MED ORDER — AMITRIPTYLINE HCL 25 MG PO TABS
100.0000 mg | ORAL_TABLET | Freq: Two times a day (BID) | ORAL | Status: DC
  Administered 2017-01-29 – 2017-02-02 (×8): 100 mg via ORAL
  Filled 2017-01-29 (×8): qty 4

## 2017-01-29 MED ORDER — ALBUTEROL (5 MG/ML) CONTINUOUS INHALATION SOLN
10.0000 mg/h | INHALATION_SOLUTION | RESPIRATORY_TRACT | Status: DC
  Administered 2017-01-29: 10 mg/h via RESPIRATORY_TRACT

## 2017-01-29 MED ORDER — HYDROCODONE-ACETAMINOPHEN 5-325 MG PO TABS
1.0000 | ORAL_TABLET | Freq: Four times a day (QID) | ORAL | Status: DC | PRN
  Administered 2017-01-29 – 2017-02-02 (×11): 2 via ORAL
  Filled 2017-01-29 (×11): qty 2

## 2017-01-29 MED ORDER — ONDANSETRON HCL 4 MG/2ML IJ SOLN: 4.0000 mg | Freq: Three times a day (TID) | INTRAMUSCULAR | Status: DC | PRN

## 2017-01-29 MED ORDER — IPRATROPIUM BROMIDE 0.02 % IN SOLN
1.0000 mg | Freq: Once | RESPIRATORY_TRACT | Status: AC
  Administered 2017-01-29: 1 mg via RESPIRATORY_TRACT
  Filled 2017-01-29: qty 5

## 2017-01-29 MED ORDER — ALBUTEROL SULFATE (2.5 MG/3ML) 0.083% IN NEBU
2.5000 mg | INHALATION_SOLUTION | Freq: Once | RESPIRATORY_TRACT | Status: AC
  Administered 2017-01-29: 2.5 mg via RESPIRATORY_TRACT
  Filled 2017-01-29: qty 3

## 2017-01-29 MED ORDER — FOLIC ACID 1 MG PO TABS
1.0000 mg | ORAL_TABLET | Freq: Every day | ORAL | Status: DC
  Administered 2017-01-30 – 2017-02-02 (×4): 1 mg via ORAL
  Filled 2017-01-29 (×4): qty 1

## 2017-01-29 MED ORDER — LORAZEPAM 2 MG/ML IJ SOLN
0.5000 mg | Freq: Three times a day (TID) | INTRAMUSCULAR | Status: AC | PRN
  Filled 2017-01-29: qty 1

## 2017-01-29 MED ORDER — AMLODIPINE BESYLATE 5 MG PO TABS
5.0000 mg | ORAL_TABLET | Freq: Every day | ORAL | Status: DC
  Administered 2017-01-30 – 2017-02-02 (×4): 5 mg via ORAL
  Filled 2017-01-29 (×4): qty 1

## 2017-01-29 MED ORDER — ASPIRIN EC 81 MG PO TBEC
81.0000 mg | DELAYED_RELEASE_TABLET | Freq: Every day | ORAL | Status: DC
  Administered 2017-01-30 – 2017-02-02 (×4): 81 mg via ORAL
  Filled 2017-01-29 (×4): qty 1

## 2017-01-29 MED ORDER — IPRATROPIUM BROMIDE 0.02 % IN SOLN
1.0000 mg | RESPIRATORY_TRACT | Status: DC
  Administered 2017-01-30 (×4): 1 mg via RESPIRATORY_TRACT
  Filled 2017-01-29 (×4): qty 5

## 2017-01-29 MED ORDER — IPRATROPIUM-ALBUTEROL 0.5-2.5 (3) MG/3ML IN SOLN
3.0000 mL | Freq: Once | RESPIRATORY_TRACT | Status: AC
  Administered 2017-01-29: 3 mL via RESPIRATORY_TRACT
  Filled 2017-01-29: qty 3

## 2017-01-29 MED ORDER — POTASSIUM CHLORIDE 20 MEQ/15ML (10%) PO SOLN
20.0000 meq | Freq: Once | ORAL | Status: AC
Start: 2017-01-29 — End: 2017-01-29
  Administered 2017-01-29: 20 meq via ORAL
  Filled 2017-01-29: qty 15

## 2017-01-29 MED ORDER — DM-GUAIFENESIN ER 30-600 MG PO TB12
1.0000 | ORAL_TABLET | Freq: Two times a day (BID) | ORAL | Status: DC
  Administered 2017-01-29 – 2017-02-02 (×8): 1 via ORAL
  Filled 2017-01-29 (×8): qty 1

## 2017-01-29 MED ORDER — LEVALBUTEROL HCL 0.63 MG/3ML IN NEBU
0.6300 mg | INHALATION_SOLUTION | Freq: Four times a day (QID) | RESPIRATORY_TRACT | Status: DC | PRN
  Administered 2017-01-30: 0.63 mg via RESPIRATORY_TRACT
  Filled 2017-01-29: qty 3

## 2017-01-29 MED ORDER — ENOXAPARIN SODIUM 40 MG/0.4ML ~~LOC~~ SOLN
40.0000 mg | Freq: Every day | SUBCUTANEOUS | Status: DC
  Administered 2017-01-29 – 2017-02-01 (×4): 40 mg via SUBCUTANEOUS
  Filled 2017-01-29 (×4): qty 0.4

## 2017-01-29 MED ORDER — SENNOSIDES-DOCUSATE SODIUM 8.6-50 MG PO TABS
1.0000 | ORAL_TABLET | Freq: Every evening | ORAL | Status: DC | PRN
  Administered 2017-02-01: 1 via ORAL
  Filled 2017-01-29: qty 1

## 2017-01-29 MED ORDER — HYDRALAZINE HCL 20 MG/ML IJ SOLN: 5.0000 mg | INTRAMUSCULAR | Status: DC | PRN

## 2017-01-29 MED ORDER — FUROSEMIDE 40 MG PO TABS: 40.0000 mg | ORAL_TABLET | Freq: Every day | ORAL | Status: DC

## 2017-01-29 MED ORDER — METHYLPREDNISOLONE SODIUM SUCC 125 MG IJ SOLR
60.0000 mg | Freq: Three times a day (TID) | INTRAMUSCULAR | Status: DC
  Administered 2017-01-29 – 2017-02-02 (×11): 60 mg via INTRAVENOUS
  Filled 2017-01-29 (×11): qty 2

## 2017-01-29 MED ORDER — POLYETHYLENE GLYCOL 3350 17 G PO PACK: 17.0000 g | PACK | Freq: Every day | ORAL | Status: DC | PRN

## 2017-01-29 MED ORDER — ALBUTEROL (5 MG/ML) CONTINUOUS INHALATION SOLN
10.0000 mg/h | INHALATION_SOLUTION | Freq: Once | RESPIRATORY_TRACT | Status: AC
  Administered 2017-01-29: 10 mg/h via RESPIRATORY_TRACT
  Filled 2017-01-29: qty 20

## 2017-01-29 NOTE — Progress Notes (Addendum)
This is a no charge note  Transfer from [facility] per Dr. Donnald Garre  51 year old lady with past medical history for hypertension, possible asthma, TIA, dCHF, fatty liver, depression, anxiety, who presents with cough, shortness of breath and wheezing for 4 days. Chest x-rays negative. BNP 11.9. Clinically consistent with asthma exacerbation vs. acute bronchitis. Per ED physician, patient did not have formal diagnosis of asthma in the past. Patient is admitted to telemetry bed as inpatient.   Please call manager of [facility] at [phone] when pt arrives to floor   Lorretta Harp, MD  [facility] Pager [phone]  If 7PM-7AM, please contact night-coverage [email or web address] Password [email or web address] 01/29/2017, 8:16 PM

## 2017-01-29 NOTE — ED Provider Notes (Signed)
[facility] EMERGENCY DEPARTMENT Provider Note   CSN: [id number] Arrival date & time: 01/29/17  1636     History   Chief Complaint Chief Complaint  Patient presents with  . Shortness of Breath    HPI Melinda Woods is a 51 y.o. female.  HPI Patient has history of recurrent presentation with wheezing and difficulty breathing. Patient reports symptoms have been getting worse since Saturday. Patient denies any fever. The cough has been nonproductive. Patient denies lower extremity pain. Past Medical History:  Diagnosis Date  . Anxiety   . CHF (congestive heart failure) (HCC)   . Fatty liver   . Hypertension   . Neuropathy   . Ovarian cyst     Patient Active Problem List   Diagnosis Date Noted  . Acute respiratory failure (HCC) 04/26/2016  . Fatty liver 04/25/2016  . Hypertension 04/23/2016  . Morbid obesity (HCC) 04/23/2016  . Peripheral neuropathy 04/23/2016  . Acute diastolic heart failure (HCC) 04/23/2016  . Right sided weakness 03/13/2014  . TIA (transient ischemic attack) 03/13/2014    Past Surgical History:  Procedure Laterality Date  . TUBAL LIGATION      OB History    No data available       Home Medications    Prior to Admission medications   Medication Sig Start Date End Date Taking? Authorizing Provider  amitriptyline (ELAVIL) 100 MG tablet Take 1 tablet (100 mg total) by mouth 2 (two) times daily. Patient taking differently: Take 100 mg by mouth 3 (three) times daily.  04/26/16   Calvert Cantor, MD  aspirin EC 81 MG tablet Take 81 mg by mouth daily.    [provider]  azithromycin (ZITHROMAX) 250 MG tablet Take 1 tablet (250 mg total) by mouth daily. Take first 2 tablets together, then 1 every day until finished. 08/05/16   Long, Arlyss Repress, MD  cyclobenzaprine (FLEXERIL) 10 MG tablet Take 1 tablet (10 mg total) by mouth 3 (three) times daily as needed for muscle spasms. 05/17/16   Arby Barrette, MD  furosemide (LASIX) 40 MG  tablet Take 1 tablet (40 mg total) by mouth daily as needed for fluid (see instructions on d/c summary). 04/26/16   Calvert Cantor, MD  HYDROcodone-acetaminophen (NORCO/VICODIN) 5-325 MG tablet Take 1-2 tablets by mouth every 6 (six) hours as needed for moderate pain. 08/01/16   Vanetta Mulders, MD  losartan (COZAAR) 25 MG tablet Take 1 tablet (25 mg total) by mouth daily. 04/26/16   Calvert Cantor, MD  metoprolol tartrate (LOPRESSOR) 25 MG tablet Take 1 tablet (25 mg total) by mouth 2 (two) times daily. 04/26/16   Calvert Cantor, MD  polyethylene glycol (MIRALAX / GLYCOLAX) packet Take 17 g by mouth daily as needed for mild constipation. 04/26/16   Calvert Cantor, MD  potassium chloride SA (K-DUR,KLOR-CON) 20 MEQ tablet Take 1 tablet (20 mEq total) by mouth daily as needed (always take with the Furosemide). 04/26/16   Calvert Cantor, MD    Family History History reviewed. No pertinent family history.  Social History Social History   Tobacco Use  . Smoking status: Never Smoker  . Smokeless tobacco: Never Used  Substance Use Topics  . Alcohol use: No  . Drug use: No     Allergies   Oxycodone base and Latex   Review of Systems Review of Systems 10 Systems reviewed and are negative for acute change except as noted in the HPI.   Physical Exam Updated Vital Signs BP 122/65   Pulse [name])  116   Temp 98.3 F (36.8 C) (Oral)   Resp 20   Ht 5\' 6"  (1.676 m)   Wt 115.7 kg (255 lb)   LMP 01/08/2017   SpO2 96%   BMI 41.16 kg/m   Physical Exam  Constitutional: She is oriented to person, place, and time.  Patient is alert and appropriate. Moderate increased work of breathing at rest. Speaking in short full sentences. Morbid obesity.  HENT:  Head: Normocephalic and atraumatic.  Nose: Nose normal.  Mouth/Throat: Oropharynx is clear and moist.  Eyes: Conjunctivae and EOM are normal.  Cardiovascular:  Tachycardia. No gross tremor or gallop.  Pulmonary/Chest:  Moderate work of breathing at rest.  Extensive wheezes throughout lung fields with poor air flow to the bases.  Abdominal: Soft. She exhibits no distension. There is no tenderness.  Musculoskeletal: Normal range of motion.  Patient has severe obesity of the lower extremities. Do not appreciate significant edema. Calves are nontender.  Neurological: She is alert and oriented to person, place, and time. No cranial nerve deficit. She exhibits normal muscle tone. Coordination normal.  Skin: Skin is warm and dry.  Psychiatric: She has a normal mood and affect.     ED Treatments / Results  Labs (all labs ordered are listed, but only abnormal results are displayed) Labs Reviewed  BASIC METABOLIC PANEL - Abnormal; Notable for the following components:      Result Value   Sodium 134 (*)    Potassium 3.4 (*)    Chloride 100 (*)    Glucose, Bld 102 (*)    All other components within normal limits  CBC  TROPONIN I  BRAIN NATRIURETIC PEPTIDE    EKG  EKG Interpretation  Date/Time:  Tuesday January 29 2017 17:27:57 EST Ventricular Rate:  114 PR Interval:    QRS Duration: 89 QT Interval:  344 QTC Calculation: 474 R Axis:   58 Text Interpretation:  Sinus tachycardia Borderline T abnormalities, diffuse leads agree. no change from previous Confirmed by [id number]) on 01/29/2017 6:06:29 PM       Radiology Dg Chest 2 View  Result Date: 01/29/2017 CLINICAL DATA:  Shortness of breath for 4 days, upper airway congestion and tachycardic. History CHF EXAM: CHEST  2 VIEW COMPARISON:  Chest radiograph August 05, 2016 FINDINGS: Cardiomediastinal silhouette is normal. No pleural effusions or focal consolidations. Trachea projects midline and there is no pneumothorax. Soft tissue planes and included osseous structures are non-suspicious. Large body habitus. Mild degenerative change of the thoracic spine. IMPRESSION: Negative chest, improved from prior radiograph. Electronically Signed   By: August 07, 2016 M.D.   On: 01/29/2017  17:33    Procedures Procedures (including critical care time) CRITICAL CARE Performed by: 14/05/2016   Total critical care time: 30 minutes  Critical care time was exclusive of separately billable procedures and treating other patients.  Critical care was necessary to treat or prevent imminent or life-threatening deterioration.  Critical care was time spent personally by me on the following activities: development of treatment plan with patient and/or surrogate as well as nursing, discussions with consultants, evaluation of patient's response to treatment, examination of patient, obtaining history from patient or surrogate, ordering and performing treatments and interventions, ordering and review of laboratory studies, ordering and review of radiographic studies, pulse oximetry and re-evaluation of patient's condition. Medications Ordered in ED Medications  magnesium sulfate IVPB 2 g 50 mL (2 g Intravenous New Bag/Given 01/29/17 1932)  albuterol (PROVENTIL,VENTOLIN) solution continuous neb (not administered)  ipratropium (ATROVENT) nebulizer solution 1 mg (not administered)  ipratropium-albuterol (DUONEB) 0.5-2.5 (3) MG/3ML nebulizer solution 3 mL (3 mLs Nebulization Given 01/29/17 1700)  albuterol (PROVENTIL) (2.5 MG/3ML) 0.083% nebulizer solution 2.5 mg (2.5 mg Nebulization Given 01/29/17 1700)  albuterol (PROVENTIL,VENTOLIN) solution continuous neb (10 mg/hr Nebulization Given 01/29/17 1741)  ipratropium (ATROVENT) nebulizer solution 1 mg (1 mg Nebulization Given 01/29/17 1741)  methylPREDNISolone sodium succinate (SOLU-MEDROL) 125 mg/2 mL injection 125 mg (125 mg Intravenous Given 01/29/17 1759)     Initial Impression / Assessment and Plan / ED Course  I have reviewed the triage vital signs and the nursing notes.  Pertinent labs & imaging results that were available during my care of the patient were reviewed by me and considered in my medical decision making (see chart for  details).    Consult: Tried hospitalist for admission. Final Clinical Impressions(s) / ED Diagnoses   Final diagnoses:  Severe persistent asthma with exacerbation  Despite maximum treatment with albuterol and ipratropium with Solu-Medrol, patient remains dyspneic with extensive wheezing. BNP is not elevated and chest x-ray is clear, diagnostically no signs of CHF. Patient denies history of asthma and has positive history of CHF however at this time findings are most suggestive of bronchospastic disease consistent with asthma. No leukocytosis or fever to suggest pneumonia. We'll continue treatment geared towards asthma\bronchitic bronchospasm with admission for persistent wheeze and dyspnea despite aggressive treatment.  ED Discharge Orders    None       Arby Barrette, MD 01/30/17 2351

## 2017-01-29 NOTE — ED Triage Notes (Signed)
Patient reports that she has had SOB since Saturday  - patient has upper airway congestion noted in triage  - tachycardic

## 2017-01-29 NOTE — ED Notes (Signed)
Paged Hospitalist via [facility] @ 19:47

## 2017-01-29 NOTE — Progress Notes (Signed)
MEDICATION RELATED CONSULT NOTE - INITIAL   Pharmacy Consult for Methotrexate Indication:   Allergies  Allergen Reactions  . Oxycodone Base Hives  . Latex Rash    Patient Measurements: Height: 5\' 6"  (167.6 cm) Weight: 257 lb 4.4 oz (116.7 kg) IBW/kg (Calculated) : 59.3 Adjusted Body Weight:   Vital Signs: Temp: 98.5 F (36.9 C) (12/04 2233) Temp Source: Oral (12/04 2233) BP: 118/73 (12/04 2233) Pulse Rate: 124 (12/04 2233) Intake/Output from previous day: No intake/output data recorded. Intake/Output from this shift: Total I/O In: 50 [IV Piggyback:50] Out: -   Labs: Recent Labs    01/29/17 1744  WBC 4.4  HGB 12.4  HCT 37.4  PLT 228  CREATININE 0.82   Estimated Creatinine Clearance: 107.8 mL/min (by C-G formula based on SCr of 0.82 mg/dL).   Microbiology: No results found for this or any previous visit (from the past 720 hour(s)).  Medical History: Past Medical History:  Diagnosis Date  . Anxiety   . CHF (congestive heart failure) (HCC)   . Fatty liver   . Hypertension   . Neuropathy   . Ovarian cyst     Medications:  Medications Prior to Admission  Medication Sig Dispense Refill Last Dose  . amitriptyline (ELAVIL) 100 MG tablet Take 1 tablet (100 mg total) by mouth 2 (two) times daily. (Patient taking differently: Take 100 mg by mouth 3 (three) times daily. )   01/28/2017 at Unknown time  . aspirin EC 81 MG tablet Take 81 mg by mouth daily.   Past Week at Unknown time  . celecoxib (CELEBREX) 200 MG capsule Take 200 mg by mouth daily.  2 Past Month at Unknown time  . folic acid (FOLVITE) 1 MG tablet Take 1 mg by mouth daily.  0 01/28/2017 at Unknown time  . furosemide (LASIX) 40 MG tablet Take 1 tablet (40 mg total) by mouth daily as needed for fluid (see instructions on d/c summary). 30 tablet 0 Past Week at Unknown time  . HYDROcodone-acetaminophen (NORCO/VICODIN) 5-325 MG tablet Take 1-2 tablets by mouth every 6 (six) hours as needed for moderate pain.  14 tablet 0 01/28/2017 at Unknown time  . losartan (COZAAR) 25 MG tablet Take 1 tablet (25 mg total) by mouth daily. 30 tablet 0 01/28/2017 at Unknown time  . methotrexate (RHEUMATREX) 2.5 MG tablet Take 6 tablets by mouth every 7 (seven) days.  0 Past Week at Unknown time  . metoprolol tartrate (LOPRESSOR) 25 MG tablet Take 1 tablet (25 mg total) by mouth 2 (two) times daily. 60 tablet 0 01/28/2017 at 1830  . senna-docusate (SENOKOT S) 8.6-50 MG tablet Take 1 tablet by mouth at bedtime as needed for mild constipation.   01/28/2017 at Unknown time  . azithromycin (ZITHROMAX) 250 MG tablet Take 1 tablet (250 mg total) by mouth daily. Take first 2 tablets together, then 1 every day until finished. (Patient not taking: Reported on 01/29/2017) 6 tablet 0 Completed Course at Unknown time  . cyclobenzaprine (FLEXERIL) 10 MG tablet Take 1 tablet (10 mg total) by mouth 3 (three) times daily as needed for muscle spasms. (Patient not taking: Reported on 01/29/2017) 20 tablet 0 Completed Course at Unknown time  . polyethylene glycol (MIRALAX / GLYCOLAX) packet Take 17 g by mouth daily as needed for mild constipation. (Patient not taking: Reported on 01/29/2017) 14 each 0 Not Taking at Unknown time  . potassium chloride SA (K-DUR,KLOR-CON) 20 MEQ tablet Take 1 tablet (20 mEq total) by mouth daily as needed (  always take with the Furosemide). 30 tablet 0    Scheduled:  . amitriptyline  100 mg Oral BID  . [START ON 01/30/2017] aspirin EC  81 mg Oral Daily  . [START ON 01/30/2017] celecoxib  200 mg Oral Daily  . dextromethorphan-guaiFENesin  1 tablet Oral BID  . enoxaparin (LOVENOX) injection  40 mg Subcutaneous Q24H  . [START ON 01/30/2017] folic acid  1 mg Oral Daily  . [START ON 01/30/2017] furosemide  40 mg Oral Daily  . ipratropium  1 mg Nebulization Q4H  . LORazepam  0.5 mg Intravenous Once  . methotrexate  15 mg Oral Q7 days  . methylPREDNISolone (SOLU-MEDROL) injection  60 mg Intravenous TID  . metoprolol  tartrate  25 mg Oral BID  . potassium chloride  20 mEq Oral Once    Assessment: Patient taking methotrexate prior to admission.  Patient admitted with acute bronchitis.  Methotrexate (Trexall; Rheumatrex) hold criteria  Hgb < 8  WBC < 3  Pltc < 100K  SCr > 1.5x baseline (or > 2 if baseline unknown)  AST or ALT >3x ULN  Bili > 1.5x ULN  Ascites or pleural effusion  Diarrhea - Grade 2 or higher  Ulcerative stomatitis  Unexplained pneumonitis / hypoxemia  Active infection   Goal of Therapy: Safe and effective use of methotrexate  Plan:  Hold methotrexate due to active infection, per policy  Darlina Guys, Jacquenette Shone Crowford 01/29/2017,11:20 PM

## 2017-01-29 NOTE — H&P (Addendum)
History and Physical    Melinda Woods WPY:[id number] DOB: 1965/08/30 DOA: 01/29/2017  Referring MD/NP/PA:   PCP: Leola Brazil, DO   Patient coming from:  The patient is coming from home.  At baseline, pt is independent for most of ADL.  Chief Complaint: Cough, shortness of breath and wheezing  HPI: Melinda Woods is a 51 y.o. female with medical history significant of hypertension, TIA, depression, anxiety, dCHF, fatty liver, morbid, obesity, RA, who presents with cough, shortness of breath and wheezing.  Patient states that she has been having cough and SOB, which has been progressively getting worse. She has dry cough and wheezing. No fever or chills. Denies chest pain. Patient denies nausea, vomiting, diarrhea, abdominal pain, symptoms of UTI or unilateral weakness. Patient states that over the last year, she has gained approximately 20 pounds. Pt states that she dose not have history of asthma. She is a former smoker, quit smoking 10 years ago.  ED Course: pt was found to have WBC 4.4, negative troponin, BNP 11.9, potassium 3.4, creatinine normal, no fever, has tachycardia, tachypnea, oxygen saturation 92-96% on 2 L nasal cannula oxygen, negative chest x-ray for acute abnormalities. Patient is admitted to telemetry bed as inpatient.  Review of Systems:   General: no fevers, chills, has body weight gain, has poor appetite, has fatigue HEENT: no blurry vision, hearing changes or sore throat Respiratory: has dyspnea, coughing, wheezing CV: no chest pain, no palpitations GI: no nausea, vomiting, abdominal pain, diarrhea, constipation GU: no dysuria, burning on urination, increased urinary frequency, hematuria  Ext: has trace leg edema Neuro: no unilateral weakness, numbness, or tingling, no vision change or hearing loss Skin: no rash, no skin tear. MSK: No muscle spasm, no deformity, no limitation of range of movement in spin Heme: No easy bruising.  Travel history: No recent long  distant travel.  Allergy:  Allergies  Allergen Reactions  . Oxycodone Base Hives  . Latex Rash    Past Medical History:  Diagnosis Date  . Anxiety   . CHF (congestive heart failure) (HCC)   . Fatty liver   . Hypertension   . Neuropathy   . Ovarian cyst   . RA (rheumatoid arthritis) (HCC)     Past Surgical History:  Procedure Laterality Date  . TUBAL LIGATION      Social History:  reports that  has never smoked. she has never used smokeless tobacco. She reports that she does not drink alcohol or use drugs.  Family History:  Family History  Problem Relation Age of Onset  . Stroke Mother   . Diabetes Mellitus II Mother   . Lung cancer Father      Prior to Admission medications   Medication Sig Start Date End Date Taking? Authorizing Provider  amitriptyline (ELAVIL) 100 MG tablet Take 1 tablet (100 mg total) by mouth 2 (two) times daily. Patient taking differently: Take 100 mg by mouth 3 (three) times daily.  04/26/16  Yes Calvert Cantor, MD  aspirin EC 81 MG tablet Take 81 mg by mouth daily.   Yes [provider]  celecoxib (CELEBREX) 200 MG capsule Take 200 mg by mouth daily. 11/20/16  Yes [provider]  folic acid (FOLVITE) 1 MG tablet Take 1 mg by mouth daily. 01/23/17  Yes [provider]  furosemide (LASIX) 40 MG tablet Take 1 tablet (40 mg total) by mouth daily as needed for fluid (see instructions on d/c summary). 04/26/16  Yes Calvert Cantor, MD  HYDROcodone-acetaminophen (NORCO/VICODIN) [id number]  MG tablet Take 1-2 tablets by mouth every 6 (six) hours as needed for moderate pain. 08/01/16  Yes Vanetta Mulders, MD  losartan (COZAAR) 25 MG tablet Take 1 tablet (25 mg total) by mouth daily. 04/26/16  Yes Calvert Cantor, MD  methotrexate (RHEUMATREX) 2.5 MG tablet Take 6 tablets by mouth every 7 (seven) days. 01/23/17  Yes [provider]  metoprolol tartrate (LOPRESSOR) 25 MG tablet Take 1 tablet (25 mg total) by mouth 2 (two) times daily.  04/26/16  Yes Calvert Cantor, MD  senna-docusate (SENOKOT S) 8.6-50 MG tablet Take 1 tablet by mouth at bedtime as needed for mild constipation.   Yes [provider]  azithromycin (ZITHROMAX) 250 MG tablet Take 1 tablet (250 mg total) by mouth daily. Take first 2 tablets together, then 1 every day until finished. Patient not taking: Reported on 01/29/2017 08/05/16   Long, Arlyss Repress, MD  cyclobenzaprine (FLEXERIL) 10 MG tablet Take 1 tablet (10 mg total) by mouth 3 (three) times daily as needed for muscle spasms. Patient not taking: Reported on 01/29/2017 05/17/16   Arby Barrette, MD  polyethylene glycol (MIRALAX / GLYCOLAX) packet Take 17 g by mouth daily as needed for mild constipation. Patient not taking: Reported on 01/29/2017 04/26/16   Calvert Cantor, MD  potassium chloride SA (K-DUR,KLOR-CON) 20 MEQ tablet Take 1 tablet (20 mEq total) by mouth daily as needed (always take with the Furosemide). 04/26/16   Calvert Cantor, MD    Physical Exam: Vitals:   01/29/17 2000 01/29/17 2100 01/29/17 2130 01/29/17 2233  BP: 118/68 (!) 114/59 119/63 118/73  Pulse: (!) 113 (!) 124 (!) 129 (!) 124  Resp: (!) 26 (!) 23 (!) 21 (!) 25  Temp:    98.5 F (36.9 C)  TempSrc:    Oral  SpO2: 92% 92% 94% 97%  Weight:    116.7 kg (257 lb 4.4 oz)  Height:    5\' 6"  (1.676 m)   General: Not in acute distress HEENT:       Eyes: PERRL, EOMI, no scleral icterus.       ENT: No discharge from the ears and nose, no pharynx injection, no tonsillar enlargement.        Neck: No JVD, no bruit, no mass felt. Heme: No neck lymph node enlargement. Cardiac: S1/S2, RRR, No murmurs, No gallops or rubs. Respiratory: Has diffuse wheezing bilaterally.  GI: Soft, nondistended, nontender, no rebound pain, no organomegaly, BS present. GU: No hematuria Ext: has trace leg edema bilaterally. 2+DP/PT pulse bilaterally. Musculoskeletal: No joint deformities, No joint redness or warmth, no limitation of ROM in spin. Skin: No rashes.    Neuro: Alert, oriented X3, cranial nerves II-XII grossly intact, moves all extremities normally. Psych: Patient is not psychotic, no suicidal or hemocidal ideation.  Labs on Admission: I have personally reviewed following labs and imaging studies  CBC: Recent Labs  Lab 01/29/17 1744  WBC 4.4  HGB 12.4  HCT 37.4  MCV 94.2  PLT 228   Basic Metabolic Panel: Recent Labs  Lab 01/29/17 1744  NA 134*  K 3.4*  CL 100*  CO2 27  GLUCOSE 102*  BUN 11  CREATININE 0.82  CALCIUM 9.1   GFR: Estimated Creatinine Clearance: 107.8 mL/min (by C-G formula based on SCr of 0.82 mg/dL). Liver Function Tests: No results for input(s): AST, ALT, ALKPHOS, BILITOT, PROT, ALBUMIN in the last 168 hours. No results for input(s): LIPASE, AMYLASE in the last 168 hours. No results for input(s): AMMONIA in the  last 168 hours. Coagulation Profile: No results for input(s): INR, PROTIME in the last 168 hours. Cardiac Enzymes: Recent Labs  Lab 01/29/17 1744  TROPONINI <0.03   BNP (last 3 results) No results for input(s): PROBNP in the last 8760 hours. HbA1C: No results for input(s): HGBA1C in the last 72 hours. CBG: No results for input(s): GLUCAP in the last 168 hours. Lipid Profile: No results for input(s): CHOL, HDL, LDLCALC, TRIG, CHOLHDL, LDLDIRECT in the last 72 hours. Thyroid Function Tests: No results for input(s): TSH, T4TOTAL, FREET4, T3FREE, THYROIDAB in the last 72 hours. Anemia Panel: No results for input(s): VITAMINB12, FOLATE, FERRITIN, TIBC, IRON, RETICCTPCT in the last 72 hours. Urine analysis:    Component Value Date/Time   COLORURINE YELLOW 03/13/2014 1222   APPEARANCEUR CLEAR 03/13/2014 1222   LABSPEC 1.012 03/13/2014 1222   PHURINE 6.0 03/13/2014 1222   GLUCOSEU NEGATIVE 03/13/2014 1222   HGBUR NEGATIVE 03/13/2014 1222   BILIRUBINUR LARGE (A) 03/13/2014 1222   KETONESUR NEGATIVE 03/13/2014 1222   PROTEINUR NEGATIVE 03/13/2014 1222   UROBILINOGEN 1.0 03/13/2014 1222    NITRITE NEGATIVE 03/13/2014 1222   LEUKOCYTESUR NEGATIVE 03/13/2014 1222   Sepsis Labs: @LABRCNTIP (procalcitonin:4,lacticidven:4) )No results found for this or any previous visit (from the past 240 hour(s)).   Radiological Exams on Admission: Dg Chest 2 View  Result Date: 01/29/2017 CLINICAL DATA:  Shortness of breath for 4 days, upper airway congestion and tachycardic. History CHF EXAM: CHEST  2 VIEW COMPARISON:  Chest radiograph August 05, 2016 FINDINGS: Cardiomediastinal silhouette is normal. No pleural effusions or focal consolidations. Trachea projects midline and there is no pneumothorax. Soft tissue planes and included osseous structures are non-suspicious. Large body habitus. Mild degenerative change of the thoracic spine. IMPRESSION: Negative chest, improved from prior radiograph. Electronically Signed   By: Awilda Metro M.D.   On: 01/29/2017 17:33     EKG: Independently reviewed.  Sinus tachycardia, low voltage, nonspecific T-wave change  Assessment/Plan Principal Problem:   Acute bronchitis Active Problems:   TIA (transient ischemic attack)   Hypertension   Chronic diastolic CHF (congestive heart failure) (HCC)   Morbid obesity (HCC)   Depression   SIRS (systemic inflammatory response syndrome) (HCC)   RA (rheumatoid arthritis) (HCC)   Acute bronchitis: Patient's dry cough, shortness breath or wheezing, plus negative chest x-ray, consistent with possible bronchitis. Patient states that she does not have history of asthma or COPD, but she is a former smoker, undiagnosed COPD is also possible. Another possibility is that the patient has rheumatoid arthritis, interstitial lung disease is a potential differential diagnosis. She has hx of CHF, but she has only trace amount of leg edema, BNP normal 11.9, no pulmonary edema on chest x-ray, less likely to have CHF exacerbation.  -will admit to tele bed as inpt -Scheduled Atrovent and prn Xopenex nebs -Solu-Medrol 80 mg IV  q8h  -Mucinex for cough  -Urine S. pneumococcal antigen -Follow up blood culture x2, sputum culture, respiratory virus panel, Flu pcr -start CPAP given morbid obesity and possible OSA  IRS (systemic inflammatory response syndrome): Patient meets criteria for SIRS with tachycardia and tachypnea, but no fever or leukocytosis. -Hold off antibiotics -will not start IVF since pt is at risk of developing CHF exacerbation. -will get Procalcitonin and trend lactic acid levels per sepsis protocol.  Addendum: lactic acid comes back elevated at 10.7 -will d/c lasix -give 2.0 L NS, then 125 cc/h   TIA (transient ischemic attack): -ASA  HTN:  -Continue home medications: Metoprolol -  Hold off cozarr to dry cough -start amlodipine 5 mg daily -IV hydralazine prn  Chronic diastolic CHF (congestive heart failure) (HCC): 2-D echo on 04/24/16 showed EF of 55-60% with grade 1 diastolic dysfunction. Patient has trace of leg edema, no pulmonary edema on chest x-ray. BNP is only 11.9, clinically does not have CHF exacerbation. -Continue Lasix 40 mg daily  Morbid obesity (HCC): Patient states that she has gained approximately 20 pounds last year. -Did counseling about importance of weight control  Depression and anxiety: Stable, no suicidal or homicidal ideations. -Continue home medications: Amitriptyline, and when necessary Ativan  Peripheral neuropathy: -on amitriptyline   RA (rheumatoid arthritis) (HCC): -continue Rheumatrex and Celebrex -check LFT   DVT ppx: SQ Lovenox Code Status: Full code Family Communication: None at bed side.      Disposition Plan:  Anticipate discharge back to previous home environment Consults called:  none Admission status:  Inpatient/tele     Date of Service 01/30/2017    Lorretta Harp [facility] Pager [phone]  If 7PM-7AM, please contact night-coverage [email or web address] Password [email or web address] 01/30/2017, 12:02 AM

## 2017-01-30 DIAGNOSIS — G459 Transient cerebral ischemic attack, unspecified: Secondary | ICD-10-CM

## 2017-01-30 DIAGNOSIS — R6510 Systemic inflammatory response syndrome (SIRS) of non-infectious origin without acute organ dysfunction: Secondary | ICD-10-CM

## 2017-01-30 LAB — CBC WITH DIFFERENTIAL/PLATELET
BASOS ABS: 0 10*3/uL (ref 0.0–0.1)
Basophils Relative: 0 %
EOS PCT: 0 %
Eosinophils Absolute: 0 10*3/uL (ref 0.0–0.7)
HCT: 37.8 % (ref 36.0–46.0)
Hemoglobin: 12.4 g/dL (ref 12.0–15.0)
LYMPHS PCT: 15 %
Lymphs Abs: 1.3 10*3/uL (ref 0.7–4.0)
MCH: 31.6 pg (ref 26.0–34.0)
MCHC: 32.8 g/dL (ref 30.0–36.0)
MCV: 96.4 fL (ref 78.0–100.0)
MONO ABS: 0.2 10*3/uL (ref 0.1–1.0)
Monocytes Relative: 2 %
Neutro Abs: 6.9 10*3/uL (ref 1.7–7.7)
Neutrophils Relative %: 83 %
PLATELETS: 252 10*3/uL (ref 150–400)
RBC: 3.92 MIL/uL (ref 3.87–5.11)
RDW: 14.8 % (ref 11.5–15.5)
WBC: 8.3 10*3/uL (ref 4.0–10.5)

## 2017-01-30 LAB — BASIC METABOLIC PANEL
ANION GAP: 8 (ref 5–15)
BUN: 9 mg/dL (ref 6–20)
CALCIUM: 8.4 mg/dL — AB (ref 8.9–10.3)
CO2: 20 mmol/L — ABNORMAL LOW (ref 22–32)
CREATININE: 0.84 mg/dL (ref 0.44–1.00)
Chloride: 106 mmol/L (ref 101–111)
GLUCOSE: 204 mg/dL — AB (ref 65–99)
Potassium: 5.1 mmol/L (ref 3.5–5.1)
Sodium: 134 mmol/L — ABNORMAL LOW (ref 135–145)

## 2017-01-30 LAB — URINALYSIS, ROUTINE W REFLEX MICROSCOPIC
BACTERIA UA: NONE SEEN
Bilirubin Urine: NEGATIVE
Glucose, UA: >=500 mg/dL — AB
Hgb urine dipstick: NEGATIVE
KETONES UR: 5 mg/dL — AB
Leukocytes, UA: NEGATIVE
Nitrite: NEGATIVE
PROTEIN: NEGATIVE mg/dL
Specific Gravity, Urine: 1.012 (ref 1.005–1.030)
pH: 5 (ref 5.0–8.0)

## 2017-01-30 LAB — HEPATIC FUNCTION PANEL
ALK PHOS: 80 U/L (ref 38–126)
ALT: 37 U/L (ref 14–54)
AST: 60 U/L — ABNORMAL HIGH (ref 15–41)
Albumin: 3.9 g/dL (ref 3.5–5.0)
BILIRUBIN TOTAL: 0.3 mg/dL (ref 0.3–1.2)
Bilirubin, Direct: <0.1 mg/dL — ABNORMAL LOW (ref 0.1–0.5)
TOTAL PROTEIN: 8.7 g/dL — AB (ref 6.5–8.1)

## 2017-01-30 LAB — HCG, QUANTITATIVE, PREGNANCY

## 2017-01-30 LAB — RESPIRATORY PANEL BY PCR
ADENOVIRUS-RVPPCR: NOT DETECTED
Bordetella pertussis: NOT DETECTED
CHLAMYDOPHILA PNEUMONIAE-RVPPCR: NOT DETECTED
CORONAVIRUS 229E-RVPPCR: NOT DETECTED
CORONAVIRUS NL63-RVPPCR: NOT DETECTED
CORONAVIRUS OC43-RVPPCR: NOT DETECTED
Coronavirus HKU1: NOT DETECTED
INFLUENZA A-RVPPCR: NOT DETECTED
INFLUENZA B-RVPPCR: NOT DETECTED
MYCOPLASMA PNEUMONIAE-RVPPCR: NOT DETECTED
Metapneumovirus: NOT DETECTED
PARAINFLUENZA VIRUS 1-RVPPCR: NOT DETECTED
PARAINFLUENZA VIRUS 4-RVPPCR: NOT DETECTED
Parainfluenza Virus 2: NOT DETECTED
Parainfluenza Virus 3: NOT DETECTED
RESPIRATORY SYNCYTIAL VIRUS-RVPPCR: DETECTED — AB
Rhinovirus / Enterovirus: NOT DETECTED

## 2017-01-30 LAB — PROCALCITONIN: Procalcitonin: <0.1 ng/mL

## 2017-01-30 LAB — LACTIC ACID, PLASMA
Lactic Acid, Venous: 10.7 mmol/L (ref 0.5–1.9)
Lactic Acid, Venous: 9.3 mmol/L (ref 0.5–1.9)
Lactic Acid, Venous: 4.2 mmol/L (ref 0.5–1.9)
Lactic Acid, Venous: 4 mmol/L (ref 0.5–1.9)

## 2017-01-30 LAB — MAGNESIUM: Magnesium: 2.3 mg/dL (ref 1.7–2.4)

## 2017-01-30 LAB — INFLUENZA PANEL BY PCR (TYPE A & B)
INFLAPCR: NEGATIVE
Influenza B By PCR: NEGATIVE

## 2017-01-30 LAB — STREP PNEUMONIAE URINARY ANTIGEN: STREP PNEUMO URINARY ANTIGEN: NEGATIVE

## 2017-01-30 LAB — HIV ANTIBODY (ROUTINE TESTING W REFLEX): HIV SCREEN 4TH GENERATION: NONREACTIVE

## 2017-01-30 MED ORDER — LEVALBUTEROL HCL 1.25 MG/0.5ML IN NEBU
1.2500 mg | INHALATION_SOLUTION | Freq: Four times a day (QID) | RESPIRATORY_TRACT | Status: DC
  Administered 2017-01-31 – 2017-02-01 (×6): 1.25 mg via RESPIRATORY_TRACT
  Filled 2017-01-30 (×6): qty 0.5

## 2017-01-30 MED ORDER — SODIUM CHLORIDE 0.9 % IV BOLUS (SEPSIS)
1000.0000 mL | Freq: Once | INTRAVENOUS | Status: AC
  Administered 2017-01-30: 1000 mL via INTRAVENOUS

## 2017-01-30 MED ORDER — DEXTROSE 5 % IV SOLN
500.0000 mg | INTRAVENOUS | Status: AC
  Administered 2017-01-30 – 2017-02-01 (×3): 500 mg via INTRAVENOUS
  Filled 2017-01-30 (×3): qty 500

## 2017-01-30 MED ORDER — MOMETASONE FURO-FORMOTEROL FUM 100-5 MCG/ACT IN AERO
2.0000 | INHALATION_SPRAY | Freq: Two times a day (BID) | RESPIRATORY_TRACT | Status: DC
  Administered 2017-01-30 – 2017-02-02 (×5): 2 via RESPIRATORY_TRACT
  Filled 2017-01-30 (×2): qty 8.8

## 2017-01-30 MED ORDER — SODIUM CHLORIDE 0.9 % IV SOLN
INTRAVENOUS | Status: DC
  Administered 2017-01-30 – 2017-02-01 (×6): via INTRAVENOUS

## 2017-01-30 MED ORDER — POLYETHYLENE GLYCOL 3350 17 G PO PACK
17.0000 g | PACK | Freq: Every morning | ORAL | Status: DC
  Administered 2017-01-30 – 2017-02-02 (×4): 17 g via ORAL
  Filled 2017-01-30 (×4): qty 1

## 2017-01-30 MED ORDER — VANCOMYCIN HCL 10 G IV SOLR
1750.0000 mg | Freq: Two times a day (BID) | INTRAVENOUS | Status: DC
  Administered 2017-01-30: 1750 mg via INTRAVENOUS
  Filled 2017-01-30: qty 1750

## 2017-01-30 MED ORDER — LEVALBUTEROL HCL 1.25 MG/0.5ML IN NEBU
1.2500 mg | INHALATION_SOLUTION | Freq: Four times a day (QID) | RESPIRATORY_TRACT | Status: DC
  Administered 2017-01-30: 1.25 mg via RESPIRATORY_TRACT
  Filled 2017-01-30: qty 0.5

## 2017-01-30 MED ORDER — IPRATROPIUM BROMIDE 0.02 % IN SOLN: 0.5000 mg | RESPIRATORY_TRACT | Status: DC

## 2017-01-30 MED ORDER — LEVALBUTEROL HCL 0.63 MG/3ML IN NEBU
0.6300 mg | INHALATION_SOLUTION | RESPIRATORY_TRACT | Status: DC | PRN
  Administered 2017-01-31: 0.63 mg via RESPIRATORY_TRACT
  Filled 2017-01-30: qty 3

## 2017-01-30 MED ORDER — SODIUM CHLORIDE 0.9 % IV BOLUS (SEPSIS)
2000.0000 mL | Freq: Once | INTRAVENOUS | Status: AC
  Administered 2017-01-30: 2000 mL via INTRAVENOUS

## 2017-01-30 MED ORDER — LEVALBUTEROL HCL 1.25 MG/0.5ML IN NEBU
1.2500 mg | INHALATION_SOLUTION | RESPIRATORY_TRACT | Status: DC
  Administered 2017-01-30: 1.25 mg via RESPIRATORY_TRACT
  Filled 2017-01-30: qty 0.5

## 2017-01-30 MED ORDER — IPRATROPIUM BROMIDE 0.02 % IN SOLN
0.5000 mg | Freq: Four times a day (QID) | RESPIRATORY_TRACT | Status: DC
  Administered 2017-01-30 – 2017-02-01 (×7): 0.5 mg via RESPIRATORY_TRACT
  Filled 2017-01-30 (×7): qty 2.5

## 2017-01-30 MED ORDER — PIPERACILLIN-TAZOBACTAM 3.375 G IVPB
3.3750 g | Freq: Three times a day (TID) | INTRAVENOUS | Status: DC
  Administered 2017-01-30: 3.375 g via INTRAVENOUS
  Filled 2017-01-30: qty 50

## 2017-01-30 NOTE — Progress Notes (Signed)
RT set up CPAP in room with M/FFM placed, oxygen line in place, humidifier filled, CPAP/Auto mode, pt. made aware to notify when ready, RT to monitor.

## 2017-01-30 NOTE — Progress Notes (Signed)
CRITICAL VALUE ALERT  Critical Value:  Lactic acid 4.2  Date & Time Notied:  01/30/17@0537   Provider Notified: Yes  Orders Received/Actions taken: yes

## 2017-01-30 NOTE — Progress Notes (Signed)
PROGRESS NOTE    Melinda Woods  NLZ:[id number] DOB: 03-Dec-1965 DOA: 01/29/2017 PCP: Leola Brazil, DO    Brief Narrative: Melinda Woods is a 51 y.o. female with medical history significant of hypertension, TIA, depression, anxiety, dCHF, fatty liver, morbid, obesity, RA, who presents with cough, shortness of breath and wheezing. Admitted for acute bronchitis, probably copd exacerbation.      Assessment & Plan:   Principal Problem:   Acute bronchitis Active Problems:   TIA (transient ischemic attack)   Hypertension   Chronic diastolic CHF (congestive heart failure) (HCC)   Morbid obesity (HCC)   Depression   SIRS (systemic inflammatory response syndrome) (HCC)   RA (rheumatoid arthritis) (HCC)   SIRS/ ACUTE COPD EXACERBATION/ ACUTE BRONCHITIS: Diffusely wheezing and sob, requiring oxygen up to 4 lit of [place] oxygen.  IV steroids, start IV zithromycin. Duonebs, add dulera.  Mucinex. Sputum cultures, .  Blood cultures pending.  Influenza pcr is negative.  resp panel shows positive RSV.  Hydration.    TIA:  Resume aspirin.    Hypertension:  Well controlled.    Chronic diastolic heart failure Compensated.    Morbid obesity:  Outpatient follow up.    Depression and anxiety:  Resume home meds.    Peripheral neuropathy:  Resume amitriptyline.    RA:  Resume rheumatrex and celebrex.      DVT prophylaxis: lovenox.  Code Status: full code.  Family Communication: none at bedside.  Disposition Plan: pending resolution of respiratory symptoms.    Consultants:   None.    Procedures: none.   Antimicrobials: azithromycin.   Subjective: Very sob requiring oxygen and not back to baseline.   Objective: Vitals:   01/30/17 0004 01/30/17 0520 01/30/17 0826 01/30/17 1055  BP:  118/70  (!) 129/59  Pulse:  (!) 105  (!) 102  Resp:  17    Temp:  97.7 F (36.5 C)    TempSrc:  Axillary    SpO2: 97% 94% (!) 87%   Weight:      Height:         Intake/Output Summary (Last 24 hours) at 01/30/2017 1643 Last data filed at 01/30/2017 0930 Gross per 24 hour  Intake 2018.34 ml  Output 1300 ml  Net 718.34 ml   Filed Weights   01/29/17 1646 01/29/17 2233  Weight: 115.7 kg (255 lb) 116.7 kg (257 lb 4.4 oz)    Examination:  General exam: Appears calm and comfortable  Respiratory system: bilateral rhonchi and wheezing.  Cardiovascular system: S1 & S2 heard, RRR. No JVD, murmurs, rubs, gallops or clicks. No pedal edema. Gastrointestinal system: Abdomen is nondistended, soft and nontender. No organomegaly or masses felt. Normal bowel sounds heard. Central nervous system: Alert and oriented. No focal neurological deficits. Extremities: Symmetric 5 x 5 power. Skin: No rashes, lesions or ulcers Psychiatry: Judgement and insight appear normal. Mood & affect appropriate.     Data Reviewed: I have personally reviewed following labs and imaging studies  CBC: Recent Labs  Lab 01/29/17 1744 01/30/17 0011  WBC 4.4 8.3  NEUTROABS  --  6.9  HGB 12.4 12.4  HCT 37.4 37.8  MCV 94.2 96.4  PLT 228 252   Basic Metabolic Panel: Recent Labs  Lab 01/29/17 1744 01/30/17 0201 01/30/17 0418  NA 134*  --  134*  K 3.4*  --  5.1  CL 100*  --  106  CO2 27  --  20*  GLUCOSE 102*  --  204*  BUN 11  --  9  CREATININE 0.82  --  0.84  CALCIUM 9.1  --  8.4*  MG  --  2.3  --    GFR: Estimated Creatinine Clearance: 105.3 mL/min (by C-G formula based on SCr of 0.84 mg/dL). Liver Function Tests: Recent Labs  Lab 01/30/17 0201  AST 60*  ALT 37  ALKPHOS 80  BILITOT 0.3  PROT 8.7*  ALBUMIN 3.9   No results for input(s): LIPASE, AMYLASE in the last 168 hours. No results for input(s): AMMONIA in the last 168 hours. Coagulation Profile: No results for input(s): INR, PROTIME in the last 168 hours. Cardiac Enzymes: Recent Labs  Lab 01/29/17 1744  TROPONINI <0.03   BNP (last 3 results) No results for input(s): PROBNP in the last 8760  hours. HbA1C: No results for input(s): HGBA1C in the last 72 hours. CBG: No results for input(s): GLUCAP in the last 168 hours. Lipid Profile: No results for input(s): CHOL, HDL, LDLCALC, TRIG, CHOLHDL, LDLDIRECT in the last 72 hours. Thyroid Function Tests: No results for input(s): TSH, T4TOTAL, FREET4, T3FREE, THYROIDAB in the last 72 hours. Anemia Panel: No results for input(s): VITAMINB12, FOLATE, FERRITIN, TIBC, IRON, RETICCTPCT in the last 72 hours. Sepsis Labs: Recent Labs  Lab 01/30/17 0011 01/30/17 0201 01/30/17 0445 01/30/17 0831  PROCALCITON <0.10  --   --   --   LATICACIDVEN 10.7* 9.3* 4.2* 4.0*    Recent Results (from the past 240 hour(s))  Respiratory Panel by PCR     Status: Abnormal   Collection Time: 01/29/17 11:16 PM  Result Value Ref Range Status   Adenovirus NOT DETECTED NOT DETECTED Final   Coronavirus 229E NOT DETECTED NOT DETECTED Final   Coronavirus HKU1 NOT DETECTED NOT DETECTED Final   Coronavirus NL63 NOT DETECTED NOT DETECTED Final   Coronavirus OC43 NOT DETECTED NOT DETECTED Final   Metapneumovirus NOT DETECTED NOT DETECTED Final   Rhinovirus / Enterovirus NOT DETECTED NOT DETECTED Final   Influenza A NOT DETECTED NOT DETECTED Final   Influenza B NOT DETECTED NOT DETECTED Final   Parainfluenza Virus 1 NOT DETECTED NOT DETECTED Final   Parainfluenza Virus 2 NOT DETECTED NOT DETECTED Final   Parainfluenza Virus 3 NOT DETECTED NOT DETECTED Final   Parainfluenza Virus 4 NOT DETECTED NOT DETECTED Final   Respiratory Syncytial Virus DETECTED (A) NOT DETECTED Final    Comment: CRITICAL RESULT CALLED TO, READ BACK BY AND VERIFIED WITH: Claria DiceJ. Edwards RN 13:05 01/30/17 ([email or web address])    Bordetella pertussis NOT DETECTED NOT DETECTED Final   Chlamydophila pneumoniae NOT DETECTED NOT DETECTED Final   Mycoplasma pneumoniae NOT DETECTED NOT DETECTED Final    Comment: Performed at [facility], [address] [place], [place] [address]          Radiology Studies: Dg Chest 2 View  Result Date: 01/29/2017 CLINICAL DATA:  Shortness of breath for 4 days, upper airway congestion and tachycardic. History CHF EXAM: CHEST  2 VIEW COMPARISON:  Chest radiograph August 05, 2016 FINDINGS: Cardiomediastinal silhouette is normal. No pleural effusions or focal consolidations. Trachea projects midline and there is no pneumothorax. Soft tissue planes and included osseous structures are non-suspicious. Large body habitus. Mild degenerative change of the thoracic spine. IMPRESSION: Negative chest, improved from prior radiograph. Electronically Signed   By: Awilda Metroourtnay  Bloomer M.D.   On: 01/29/2017 17:33        Scheduled Meds: . amitriptyline  100 mg Oral BID  . amLODipine  5 mg Oral Daily  . aspirin EC  81 mg Oral Daily  . celecoxib  200 mg Oral Daily  . dextromethorphan-guaiFENesin  1 tablet Oral BID  . enoxaparin (LOVENOX) injection  40 mg Subcutaneous QHS  . folic acid  1 mg Oral Daily  . ipratropium  0.5 mg Nebulization Q6H  . levalbuterol  1.25 mg Nebulization Q6H  . methylPREDNISolone (SOLU-MEDROL) injection  60 mg Intravenous TID  . metoprolol tartrate  25 mg Oral BID  . mometasone-formoterol  2 puff Inhalation BID  . polyethylene glycol  17 g Oral q morning - 10a   Continuous Infusions: . sodium chloride 125 mL/hr at 01/30/17 0712     LOS: 1 day    Time spent: 35 minutes.     Kathlen Mody, MD [facility] Pager [phone]  If 7PM-7AM, please contact night-coverage [email or web address] Password [facility] 01/30/2017, 4:43 PM

## 2017-01-30 NOTE — Progress Notes (Addendum)
CRITICAL VALUE ALERT  Critical Value:  Lactic acid 4.0  Date & Time Notied:  01/30/2017 1779  Provider Notified: Blake Divine  Orders Received/Actions taken: Dr Blake Divine assessed

## 2017-01-30 NOTE — Progress Notes (Signed)
CRITICAL VALUE ALERT  Critical Value:  Lactic acid 9.3  Date & Time Notied:  01/30/17 2440  Provider Notified Craige Cotta  Orders Received/Actions taken:

## 2017-01-30 NOTE — Progress Notes (Signed)
CRITICAL VALUE ALERT  Critical Value:  Lactic 10.7  Date & Time Notied:  09/30/16@0106   Provider Notified: Yes  Orders Received/Actions taken: yes

## 2017-01-30 NOTE — Progress Notes (Signed)
Pharmacy Antibiotic Note  [name] is a 51 y.o. female admitted on 01/29/2017 with sepsis.  Pharmacy has been consulted for Vancomycin and Zosyn dosing.  Plan: Zosyn 3.375g IV Q8H infused over 4hrs. Vancomycin 1750mg  iv q12   Goal AUC = 400 - 500 for all indications, except meningitis (goal AUC > 500 and Cmin 15-20 mcg/mL)    Height: 5\' 6"  (167.6 cm) Weight: 257 lb 4.4 oz (116.7 kg) IBW/kg (Calculated) : 59.3  Temp (24hrs), Avg:98.2 F (36.8 C), Min:97.7 F (36.5 C), Max:98.5 F (36.9 C)  Recent Labs  Lab 01/29/17 1744 01/30/17 0011 01/30/17 0201 01/30/17 0445  WBC 4.4 8.3  --   --   CREATININE 0.82  --   --   --   LATICACIDVEN  --  10.7* 9.3* 4.2*    Estimated Creatinine Clearance: 107.8 mL/min (by C-G formula based on SCr of 0.82 mg/dL).    Allergies  Allergen Reactions  . Oxycodone Base Hives  . Latex Rash    Antimicrobials this admission: Vancomycin 01/30/2017 >> Zosyn 01/30/2017 >>   Dose adjustments this admission: -  Microbiology results: pending  Thank you for allowing pharmacy to be a part of this patient's care.  14/06/2016 [place] 01/30/2017 6:20 AM

## 2017-01-31 LAB — BASIC METABOLIC PANEL
ANION GAP: 8 (ref 5–15)
BUN: 9 mg/dL (ref 6–20)
CALCIUM: 8.8 mg/dL — AB (ref 8.9–10.3)
CO2: 24 mmol/L (ref 22–32)
Chloride: 105 mmol/L (ref 101–111)
Creatinine, Ser: 0.7 mg/dL (ref 0.44–1.00)
GLUCOSE: 177 mg/dL — AB (ref 65–99)
Potassium: 4.1 mmol/L (ref 3.5–5.1)
Sodium: 137 mmol/L (ref 135–145)

## 2017-01-31 LAB — LACTIC ACID, PLASMA: Lactic Acid, Venous: 2.9 mmol/L (ref 0.5–1.9)

## 2017-01-31 NOTE — Progress Notes (Signed)
Pt. Placed on CPAP @ this time, tolerating well, made aware to notify, RT to monitor.

## 2017-01-31 NOTE — Progress Notes (Signed)
CRITICAL VALUE ALERT  Critical Value:  Lactic Acid 2.9  Date & Time Notied:  01/31/2017 1025  Provider Notified: Dr. Blake Divine  Orders Received/Actions taken: n/a

## 2017-01-31 NOTE — Progress Notes (Signed)
PROGRESS NOTE    Melinda Woods  ZOX:[id number] DOB: 02-Sep-1965 DOA: 01/29/2017 PCP: Leola Brazil, DO    Brief Narrative: Melinda Woods is a 51 y.o. female with medical history significant of hypertension, TIA, depression, anxiety, dCHF, fatty liver, morbid, obesity, RA, who presents with cough, shortness of breath and wheezing. Admitted for acute bronchitis, probably copd exacerbation.      Assessment & Plan:   Principal Problem:   Acute bronchitis Active Problems:   TIA (transient ischemic attack)   Hypertension   Chronic diastolic CHF (congestive heart failure) (HCC)   Morbid obesity (HCC)   Depression   SIRS (systemic inflammatory response syndrome) (HCC)   RA (rheumatoid arthritis) (HCC)   SIRS/ ACUTE COPD EXACERBATION/ RSV ACUTE BRONCHITIS: Diffusely wheezing and sob, requiring oxygen up to 4 lit of [place] oxygen, start weaning her off oxygen in the next 24 hours. .  IV steroids, start IV zithromycin. Duonebs, add dulera.  Mucinex. Sputum cultures, .  Blood cultures pending.  Influenza pcr is negative.  resp panel shows positive RSV.  Hydration.    TIA:  Resume aspirin.    Hypertension:  Well controlled.    Chronic diastolic heart failure Compensated.    Morbid obesity:  Outpatient follow up.    Depression and anxiety:  Resume home meds.    Peripheral neuropathy:  Resume amitriptyline.    RA:  Resume rheumatrex and celebrex.   Elevated lactic acidosis:  Possibly from dehydration.      DVT prophylaxis: lovenox.  Code Status: full code.  Family Communication: none at bedside.  Disposition Plan: pending resolution of respiratory symptoms.    Consultants:   None.    Procedures: none.   Antimicrobials: azithromycin.   Subjective: Still sob, and coughing and diffusely wheezing.   Objective: Vitals:   01/31/17 0449 01/31/17 1100 01/31/17 1216 01/31/17 1320  BP: 111/60   109/60  Pulse: 89   91  Resp: 18   19  Temp: 97.9 F  (36.6 C)   99 F (37.2 C)  TempSrc: Oral   Oral  SpO2: 97% 96% 94% 95%  Weight:      Height:        Intake/Output Summary (Last 24 hours) at 01/31/2017 1502 Last data filed at 01/31/2017 1100 Gross per 24 hour  Intake 2610 ml  Output -  Net 2610 ml   Filed Weights   01/29/17 1646 01/29/17 2233  Weight: 115.7 kg (255 lb) 116.7 kg (257 lb 4.4 oz)    Examination:  General exam: Appears calm and comfortable but on [place] oxygen.  Respiratory system: bilateral rhonchi and wheezing. Improved compared to yesterday.  Cardiovascular system: S1 & S2 heard, RRR. No JVD, murmurs, rubs, gallops or clicks. No pedal edema. Gastrointestinal system: Abdomen is  Soft non tender non distended bowel sounds heard.  Central nervous system: Alert and oriented, non focal.  Extremities: Symmetric 5 x 5 power. No cyanosis or clubbing.  Skin: No rashes, lesions or ulcers Psychiatry:  Mood & affect appropriate.     Data Reviewed: I have personally reviewed following labs and imaging studies  CBC: Recent Labs  Lab 01/29/17 1744 01/30/17 0011  WBC 4.4 8.3  NEUTROABS  --  6.9  HGB 12.4 12.4  HCT 37.4 37.8  MCV 94.2 96.4  PLT 228 252   Basic Metabolic Panel: Recent Labs  Lab 01/29/17 1744 01/30/17 0201 01/30/17 0418 01/31/17 0927  NA 134*  --  134* 137  K 3.4*  --  5.1 4.1  CL 100*  --  106 105  CO2 27  --  20* 24  GLUCOSE 102*  --  204* 177*  BUN 11  --  9 9  CREATININE 0.82  --  0.84 0.70  CALCIUM 9.1  --  8.4* 8.8*  MG  --  2.3  --   --    GFR: Estimated Creatinine Clearance: 110.5 mL/min (by C-G formula based on SCr of 0.7 mg/dL). Liver Function Tests: Recent Labs  Lab 01/30/17 0201  AST 60*  ALT 37  ALKPHOS 80  BILITOT 0.3  PROT 8.7*  ALBUMIN 3.9   No results for input(s): LIPASE, AMYLASE in the last 168 hours. No results for input(s): AMMONIA in the last 168 hours. Coagulation Profile: No results for input(s): INR, PROTIME in the last 168 hours. Cardiac  Enzymes: Recent Labs  Lab 01/29/17 1744  TROPONINI <0.03   BNP (last 3 results) No results for input(s): PROBNP in the last 8760 hours. HbA1C: No results for input(s): HGBA1C in the last 72 hours. CBG: No results for input(s): GLUCAP in the last 168 hours. Lipid Profile: No results for input(s): CHOL, HDL, LDLCALC, TRIG, CHOLHDL, LDLDIRECT in the last 72 hours. Thyroid Function Tests: No results for input(s): TSH, T4TOTAL, FREET4, T3FREE, THYROIDAB in the last 72 hours. Anemia Panel: No results for input(s): VITAMINB12, FOLATE, FERRITIN, TIBC, IRON, RETICCTPCT in the last 72 hours. Sepsis Labs: Recent Labs  Lab 01/30/17 0011 01/30/17 0201 01/30/17 0445 01/30/17 0831 01/31/17 0927  PROCALCITON <0.10  --   --   --   --   LATICACIDVEN 10.7* 9.3* 4.2* 4.0* 2.9*    Recent Results (from the past 240 hour(s))  Respiratory Panel by PCR     Status: Abnormal   Collection Time: 01/29/17 11:16 PM  Result Value Ref Range Status   Adenovirus NOT DETECTED NOT DETECTED Final   Coronavirus 229E NOT DETECTED NOT DETECTED Final   Coronavirus HKU1 NOT DETECTED NOT DETECTED Final   Coronavirus NL63 NOT DETECTED NOT DETECTED Final   Coronavirus OC43 NOT DETECTED NOT DETECTED Final   Metapneumovirus NOT DETECTED NOT DETECTED Final   Rhinovirus / Enterovirus NOT DETECTED NOT DETECTED Final   Influenza A NOT DETECTED NOT DETECTED Final   Influenza B NOT DETECTED NOT DETECTED Final   Parainfluenza Virus 1 NOT DETECTED NOT DETECTED Final   Parainfluenza Virus 2 NOT DETECTED NOT DETECTED Final   Parainfluenza Virus 3 NOT DETECTED NOT DETECTED Final   Parainfluenza Virus 4 NOT DETECTED NOT DETECTED Final   Respiratory Syncytial Virus DETECTED (A) NOT DETECTED Final    Comment: CRITICAL RESULT CALLED TO, READ BACK BY AND VERIFIED WITH: Claria Dice RN 13:05 01/30/17 ([email or web address])    Bordetella pertussis NOT DETECTED NOT DETECTED Final   Chlamydophila pneumoniae NOT DETECTED NOT DETECTED Final    Mycoplasma pneumoniae NOT DETECTED NOT DETECTED Final    Comment: Performed at [facility], [address], [place], [place] [address]  Culture, blood (routine x 2) Call MD if unable to obtain prior to antibiotics being given     Status: None (Preliminary result)   Collection Time: 01/29/17 11:40 PM  Result Value Ref Range Status   Specimen Description BLOOD LEFT ANTECUBITAL  Final   Special Requests   Final    BOTTLES DRAWN AEROBIC AND ANAEROBIC Blood Culture adequate volume   Culture   Final    NO GROWTH 1 DAY Performed at [facility], [address] [place], [place] [address]  Report Status PENDING  Incomplete  Culture, blood (routine x 2) Call MD if unable to obtain prior to antibiotics being given     Status: None (Preliminary result)   Collection Time: 01/29/17 11:40 PM  Result Value Ref Range Status   Specimen Description BLOOD RIGHT HAND  Final   Special Requests   Final    BOTTLES DRAWN AEROBIC AND ANAEROBIC Blood Culture adequate volume   Culture   Final    NO GROWTH 1 DAY Performed at [facility], [address], [place], [place] [address]    Report Status PENDING  Incomplete         Radiology Studies: Dg Chest 2 View  Result Date: 01/29/2017 CLINICAL DATA:  Shortness of breath for 4 days, upper airway congestion and tachycardic. History CHF EXAM: CHEST  2 VIEW COMPARISON:  Chest radiograph August 05, 2016 FINDINGS: Cardiomediastinal silhouette is normal. No pleural effusions or focal consolidations. Trachea projects midline and there is no pneumothorax. Soft tissue planes and included osseous structures are non-suspicious. Large body habitus. Mild degenerative change of the thoracic spine. IMPRESSION: Negative chest, improved from prior radiograph. Electronically Signed   By: Awilda Metro M.D.   On: 01/29/2017 17:33        Scheduled Meds: . amitriptyline  100 mg Oral BID  . amLODipine  5 mg Oral Daily  . aspirin EC  81 mg Oral Daily   . celecoxib  200 mg Oral Daily  . dextromethorphan-guaiFENesin  1 tablet Oral BID  . enoxaparin (LOVENOX) injection  40 mg Subcutaneous QHS  . folic acid  1 mg Oral Daily  . ipratropium  0.5 mg Nebulization Q6H  . levalbuterol  1.25 mg Nebulization Q6H  . methylPREDNISolone (SOLU-MEDROL) injection  60 mg Intravenous TID  . metoprolol tartrate  25 mg Oral BID  . mometasone-formoterol  2 puff Inhalation BID  . polyethylene glycol  17 g Oral q morning - 10a   Continuous Infusions: . sodium chloride 125 mL/hr at 01/31/17 1044  . azithromycin Stopped (01/30/17 2107)     LOS: 2 days    Time spent: 35 minutes.     Kathlen Mody, MD [facility] Pager [phone]  If 7PM-7AM, please contact night-coverage [email or web address] Password [email or web address] 01/31/2017, 3:02 PM

## 2017-01-31 NOTE — Progress Notes (Signed)
Pt. aware to notify if/when wanting to be placed on CPAP, RT to monitor on rounds.

## 2017-02-01 ENCOUNTER — Encounter (HOSPITAL_COMMUNITY): Payer: Self-pay

## 2017-02-01 ENCOUNTER — Inpatient Hospital Stay (HOSPITAL_COMMUNITY): Payer: 59

## 2017-02-01 DIAGNOSIS — J4551 Severe persistent asthma with (acute) exacerbation: Secondary | ICD-10-CM

## 2017-02-01 LAB — LACTIC ACID, PLASMA: LACTIC ACID, VENOUS: 3.4 mmol/L — AB (ref 0.5–1.9)

## 2017-02-01 MED ORDER — IPRATROPIUM-ALBUTEROL 0.5-2.5 (3) MG/3ML IN SOLN
3.0000 mL | RESPIRATORY_TRACT | Status: DC
  Administered 2017-02-01 (×3): 3 mL via RESPIRATORY_TRACT
  Filled 2017-02-01 (×4): qty 3

## 2017-02-01 NOTE — Progress Notes (Signed)
Pt. placed on CPAP for h/s with scheduled aerosol tx. given thru circuit, room air saturations 93%, remains wheezing-mild Exp., tolerating well, RT to monitor.

## 2017-02-01 NOTE — Progress Notes (Signed)
PROGRESS NOTE    Melinda Woods  ZOX:[id number] DOB: 28-Aug-1965 DOA: 01/29/2017 PCP: Leola Brazil, DO    Brief Narrative: Melinda Woods is a 51 y.o. female with medical history significant of hypertension, TIA, depression, anxiety, dCHF, fatty liver, morbid, obesity, RA, who presents with cough, shortness of breath and wheezing. Admitted for acute bronchitis, probably copd exacerbation.      Assessment & Plan:   Principal Problem:   Acute bronchitis Active Problems:   TIA (transient ischemic attack)   Hypertension   Chronic diastolic CHF (congestive heart failure) (HCC)   Morbid obesity (HCC)   Depression   SIRS (systemic inflammatory response syndrome) (HCC)   RA (rheumatoid arthritis) (HCC)   SIRS/ ACUTE COPD EXACERBATION/ RSV ACUTE BRONCHITIS: Diffusely wheezing and sob, requiring oxygen up to 4 lit of [place] oxygen, start weaning her off oxygen in the next 24 hours.  Before not able to wean her off the oxygen today we will try again later tomorrow.  Since she continues to have diffuse wheezing bilaterally anteriorly and posteriorly would resume IV steroids IV Zithromax..  Change levalbuterol to duo nebs every 4 hours scheduled.  Continue with Dulera Mucinex. Sputum cultures, .  Blood cultures pending.  Influenza pcr is negative.  resp panel shows positive RSV.  Repeat chest x-ray today did not show any new pneumonia.   TIA:  Resume aspirin.    Hypertension:  Well controlled.    Chronic diastolic heart failure Compensated.  We will stop fluids to prevent fluid overload   Morbid obesity:  Outpatient follow up.    Depression and anxiety:  Resume home meds.    Peripheral neuropathy:  Resume amitriptyline.    RA:  Resume rheumatrex and celebrex.   Elevated lactic acidosis:  Possibly from dehydration.  Lactic acid continues to be slightly high.     DVT prophylaxis: lovenox.  Code Status: full code.  Family Communication: none at bedside.    Disposition Plan: pending resolution of respiratory symptoms.    Consultants:   None.    Procedures: none.   Antimicrobials: azithromycin.   Subjective: Dyspneic at rest, persistent cough, shortness of breath on talking not able to complete sentences.  Patient not back to baseline yet Objective: Vitals:   01/31/17 2123 02/01/17 0113 02/01/17 0515 02/01/17 1323  BP:   (!) 114/59 107/62  Pulse:  96 94 95  Resp:   20 18  Temp:   98.6 F (37 C) 98.5 F (36.9 C)  TempSrc:   Oral Oral  SpO2: 93% 93% 98% 100%  Weight:      Height:        Intake/Output Summary (Last 24 hours) at 02/01/2017 1717 Last data filed at 02/01/2017 1045 Gross per 24 hour  Intake 2838.75 ml  Output -  Net 2838.75 ml   Filed Weights   01/29/17 1646 01/29/17 2233  Weight: 115.7 kg (255 lb) 116.7 kg (257 lb 4.4 oz)    Examination:  General exam: Appears mild distress from shortness of breath Respiratory system: bilateral rhonchi and wheezing.  Air entry fair Cardiovascular system: S1 & S2 heard, RRR. No JVD, murmurs, rubs, gallops or clicks. No pedal edema. Gastrointestinal system: Abdomen is  Soft non tender non distended bowel sounds heard.  Central nervous system: Alert and oriented, non focal.  Extremities: Symmetric 5 x 5 power. No cyanosis or clubbing.  Skin: No rashes, lesions or ulcers Psychiatry:  Mood & affect appropriate.     Data Reviewed: I have personally reviewed following  labs and imaging studies  CBC: Recent Labs  Lab 01/29/17 1744 01/30/17 0011  WBC 4.4 8.3  NEUTROABS  --  6.9  HGB 12.4 12.4  HCT 37.4 37.8  MCV 94.2 96.4  PLT 228 252   Basic Metabolic Panel: Recent Labs  Lab 01/29/17 1744 01/30/17 0201 01/30/17 0418 01/31/17 0927  NA 134*  --  134* 137  K 3.4*  --  5.1 4.1  CL 100*  --  106 105  CO2 27  --  20* 24  GLUCOSE 102*  --  204* 177*  BUN 11  --  9 9  CREATININE 0.82  --  0.84 0.70  CALCIUM 9.1  --  8.4* 8.8*  MG  --  2.3  --   --     GFR: Estimated Creatinine Clearance: 110.5 mL/min (by C-G formula based on SCr of 0.7 mg/dL). Liver Function Tests: Recent Labs  Lab 01/30/17 0201  AST 60*  ALT 37  ALKPHOS 80  BILITOT 0.3  PROT 8.7*  ALBUMIN 3.9   No results for input(s): LIPASE, AMYLASE in the last 168 hours. No results for input(s): AMMONIA in the last 168 hours. Coagulation Profile: No results for input(s): INR, PROTIME in the last 168 hours. Cardiac Enzymes: Recent Labs  Lab 01/29/17 1744  TROPONINI <0.03   BNP (last 3 results) No results for input(s): PROBNP in the last 8760 hours. HbA1C: No results for input(s): HGBA1C in the last 72 hours. CBG: No results for input(s): GLUCAP in the last 168 hours. Lipid Profile: No results for input(s): CHOL, HDL, LDLCALC, TRIG, CHOLHDL, LDLDIRECT in the last 72 hours. Thyroid Function Tests: No results for input(s): TSH, T4TOTAL, FREET4, T3FREE, THYROIDAB in the last 72 hours. Anemia Panel: No results for input(s): VITAMINB12, FOLATE, FERRITIN, TIBC, IRON, RETICCTPCT in the last 72 hours. Sepsis Labs: Recent Labs  Lab 01/30/17 0011  01/30/17 0445 01/30/17 0831 01/31/17 0927 02/01/17 1134  PROCALCITON <0.10  --   --   --   --   --   LATICACIDVEN 10.7*   < > 4.2* 4.0* 2.9* 3.4*   < > = values in this interval not displayed.    Recent Results (from the past 240 hour(s))  Respiratory Panel by PCR     Status: Abnormal   Collection Time: 01/29/17 11:16 PM  Result Value Ref Range Status   Adenovirus NOT DETECTED NOT DETECTED Final   Coronavirus 229E NOT DETECTED NOT DETECTED Final   Coronavirus HKU1 NOT DETECTED NOT DETECTED Final   Coronavirus NL63 NOT DETECTED NOT DETECTED Final   Coronavirus OC43 NOT DETECTED NOT DETECTED Final   Metapneumovirus NOT DETECTED NOT DETECTED Final   Rhinovirus / Enterovirus NOT DETECTED NOT DETECTED Final   Influenza A NOT DETECTED NOT DETECTED Final   Influenza B NOT DETECTED NOT DETECTED Final   Parainfluenza Virus  1 NOT DETECTED NOT DETECTED Final   Parainfluenza Virus 2 NOT DETECTED NOT DETECTED Final   Parainfluenza Virus 3 NOT DETECTED NOT DETECTED Final   Parainfluenza Virus 4 NOT DETECTED NOT DETECTED Final   Respiratory Syncytial Virus DETECTED (A) NOT DETECTED Final    Comment: CRITICAL RESULT CALLED TO, READ BACK BY AND VERIFIED WITH: Claria Dice RN 13:05 01/30/17 ([email or web address])    Bordetella pertussis NOT DETECTED NOT DETECTED Final   Chlamydophila pneumoniae NOT DETECTED NOT DETECTED Final   Mycoplasma pneumoniae NOT DETECTED NOT DETECTED Final    Comment: Performed at [facility], [address] [place], [place]  [address]  Culture, blood (routine x 2) Call MD if unable to obtain prior to antibiotics being given     Status: None (Preliminary result)   Collection Time: 01/29/17 11:40 PM  Result Value Ref Range Status   Specimen Description BLOOD LEFT ANTECUBITAL  Final   Special Requests   Final    BOTTLES DRAWN AEROBIC AND ANAEROBIC Blood Culture adequate volume   Culture   Final    NO GROWTH 2 DAYS Performed at [facility], [address] [place], [place] [address]    Report Status PENDING  Incomplete  Culture, blood (routine x 2) Call MD if unable to obtain prior to antibiotics being given     Status: None (Preliminary result)   Collection Time: 01/29/17 11:40 PM  Result Value Ref Range Status   Specimen Description BLOOD RIGHT HAND  Final   Special Requests   Final    BOTTLES DRAWN AEROBIC AND ANAEROBIC Blood Culture adequate volume   Culture   Final    NO GROWTH 2 DAYS Performed at [facility], [address], [place], [place] [address]    Report Status PENDING  Incomplete         Radiology Studies: Dg Chest 2 View  Result Date: 02/01/2017 CLINICAL DATA:  Shortness of breath. EXAM: CHEST  2 VIEW COMPARISON:  Radiographs of January 29, 2017. FINDINGS: The heart size and mediastinal contours are within normal limits. Both lungs are clear. No pneumothorax  or pleural effusion is noted. The visualized skeletal structures are unremarkable. IMPRESSION: No active cardiopulmonary disease. Electronically Signed   By: Lupita Raider, M.D.   On: 02/01/2017 15:17        Scheduled Meds: . amitriptyline  100 mg Oral BID  . amLODipine  5 mg Oral Daily  . aspirin EC  81 mg Oral Daily  . celecoxib  200 mg Oral Daily  . dextromethorphan-guaiFENesin  1 tablet Oral BID  . enoxaparin (LOVENOX) injection  40 mg Subcutaneous QHS  . folic acid  1 mg Oral Daily  . ipratropium-albuterol  3 mL Nebulization Q4H  . methylPREDNISolone (SOLU-MEDROL) injection  60 mg Intravenous TID  . metoprolol tartrate  25 mg Oral BID  . mometasone-formoterol  2 puff Inhalation BID  . polyethylene glycol  17 g Oral q morning - 10a   Continuous Infusions: . azithromycin Stopped (01/31/17 1815)     LOS: 3 days    Time spent: 35 minutes.     Kathlen Mody, MD [facility] Pager [phone]  If 7PM-7AM, please contact night-coverage [email or web address] Password [email or web address] 02/01/2017, 5:17 PM

## 2017-02-01 NOTE — Progress Notes (Deleted)
RN called and asked for RT to give a HHN to the Pt. RT entered the room and the Pt was struggling to breath and had a lot of secretion in his throat. RT set up SX and tried to SX the back of the Pt;s throat and was unable to get any mucus. Rt expressed to the nurse that the Pt needed to have a morphine drip and that she didn't think that the pt would make it to hospice. RN called the doctor and the doctor ordered to start the morphine drip. RT will continue to monitor.

## 2017-02-01 NOTE — Progress Notes (Signed)
CRITICAL VALUE ALERT  Critical Value:  Lactic acid 3.4  Date & Time Notied:  02/01/2017 about 1245  Provider Notified: Dr. Blake Divine  Orders Received/Actions taken:

## 2017-02-02 MED ORDER — IPRATROPIUM-ALBUTEROL 0.5-2.5 (3) MG/3ML IN SOLN
3.0000 mL | Freq: Two times a day (BID) | RESPIRATORY_TRACT | Status: DC
Start: 2017-02-02 — End: 2017-02-02

## 2017-02-02 MED ORDER — IPRATROPIUM-ALBUTEROL 0.5-2.5 (3) MG/3ML IN SOLN
3.0000 mL | Freq: Four times a day (QID) | RESPIRATORY_TRACT | Status: DC
  Administered 2017-02-02: 3 mL via RESPIRATORY_TRACT
  Filled 2017-02-02: qty 3

## 2017-02-02 MED ORDER — IPRATROPIUM-ALBUTEROL 0.5-2.5 (3) MG/3ML IN SOLN: 3.0000 mL | Freq: Two times a day (BID) | RESPIRATORY_TRACT | 0 refills | Status: AC

## 2017-02-02 MED ORDER — MOMETASONE FURO-FORMOTEROL FUM 100-5 MCG/ACT IN AERO: 2.0000 | INHALATION_SPRAY | Freq: Two times a day (BID) | RESPIRATORY_TRACT | 0 refills | Status: DC

## 2017-02-02 MED ORDER — PREDNISONE 10 MG PO TABS: ORAL_TABLET | ORAL | 0 refills | Status: DC

## 2017-02-02 MED ORDER — FUROSEMIDE 40 MG PO TABS: 40.0000 mg | ORAL_TABLET | Freq: Every day | ORAL | 0 refills | Status: DC | PRN

## 2017-02-02 MED ORDER — DM-GUAIFENESIN ER 30-600 MG PO TB12: 1.0000 | ORAL_TABLET | Freq: Two times a day (BID) | ORAL | 0 refills | Status: DC

## 2017-02-02 MED ORDER — HYDROCODONE-ACETAMINOPHEN 5-325 MG PO TABS: 1.0000 | ORAL_TABLET | Freq: Four times a day (QID) | ORAL | 0 refills | Status: DC | PRN

## 2017-02-02 NOTE — Care Management Note (Signed)
Case Management Note  Patient Details  Name: Rekita Miotke MRN: [id number] Date of Birth: October 19, 1965  Subjective/Objective:    DME home nebulizer and CPAP ordered for pt.  Pt states she had the in-home component of a sleep study but her insurance wouldn't cover the overnight clinic component of the study.                   Action/Plan: Per Jermaine with [facility], the inpatient portion of sleep study is required and CPAP cannot be provided.  Home nebulizer will be delivered to room.  Discussed with pt and advised to call insurance company. Pt verbalizes understanding.      Expected Discharge Date:  02/02/17               Expected Discharge Plan:  Home/Self Care  In-House Referral:     Discharge planning Services  CM Consult  Post Acute Care Choice:    Choice offered to:     DME Arranged:    DME Agency:     HH Arranged:    HH Agency:     Status of Service:  In process, will continue to follow  If discussed at Long Length of Stay Meetings, dates discussed:    Additional Comments:  Verdene Lennert, RN 02/02/2017, 12:40 PM

## 2017-02-02 NOTE — Progress Notes (Signed)
Went over discharge papers with patient. All questions answered.  Prescriptions and discharge summary given to patient.  Pt wheeled out via [employer].

## 2017-02-02 NOTE — Progress Notes (Signed)
Pt. placed on CPAP for h/s, humidifier refilled, pt. on room air, tolerating well.

## 2017-02-04 LAB — CULTURE, BLOOD (ROUTINE X 2)
Culture: NO GROWTH
Culture: NO GROWTH
SPECIAL REQUESTS: ADEQUATE
Special Requests: ADEQUATE

## 2017-02-05 NOTE — Discharge Summary (Signed)
Physician Discharge Summary  Melinda Woods FMB:[id number] DOB: [birth year] DOA: 01/29/2017  PCP: Leola Brazil, DO  Admit date: 01/29/2017 Discharge date: 02/02/2017  Admitted From: Home.  Disposition: Home.   Recommendations for Outpatient Follow-up:  1. Follow up with PCP in 1-2 weeks 2. Please obtain BMP/CBC in one week  Home Health:: yes.   Discharge Condition:stable.  CODE STATUS: full code.  Diet recommendation: Heart Healthy   Brief/Interim Summary: Melinda Woods a 51 y.o.femalewith medical history significant ofhypertension, TIA, depression, anxiety,dCHF, fatty liver,morbid,obesity, RA,who presents with cough, shortness of breath and wheezing. Admitted for acute bronchitis, probably copd exacerbation.     Discharge Diagnoses:  Principal Problem:   Acute bronchitis Active Problems:   TIA (transient ischemic attack)   Hypertension   Chronic diastolic CHF (congestive heart failure) (HCC)   Morbid obesity (HCC)   Depression   SIRS (systemic inflammatory response syndrome) (HCC)   RA (rheumatoid arthritis) (HCC)   SIRS/ ACUTE COPD EXACERBATION/ RSV ACUTE BRONCHITIS: Diffusely wheezing and sob, requiring oxygen up to 4 lit of [place] oxygen on admission. We have weaned her off oxygen.   Resume duo nebs. Mucinex. Sputum cultures, .  Blood cultures pending.  Influenza pcr is negative.  resp panel shows positive RSV.  Repeat chest x-ray today did not show any new pneumonia.   TIA:  Resume aspirin.    Hypertension:  Well controlled.    Chronic diastolic heart failure Compensated.     Morbid obesity:  Outpatient follow up.    Depression and anxiety:  Resume home meds.    Peripheral neuropathy:  Resume amitriptyline.    RA:  Resume rheumatrex and celebrex.   Elevated lactic acidosis:  Possibly from dehydration.       Discharge Instructions  Discharge Instructions    Diet - low sodium heart healthy   Complete by:   As directed    Discharge instructions   Complete by:  As directed    Follow up with PCP in one week.     Allergies as of 02/02/2017      Reactions   Oxycodone Base Hives   Latex Rash      Medication List    STOP taking these medications   azithromycin 250 MG tablet Commonly known as:  ZITHROMAX   cyclobenzaprine 10 MG tablet Commonly known as:  FLEXERIL     TAKE these medications   amitriptyline 100 MG tablet Commonly known as:  ELAVIL Take 1 tablet (100 mg total) by mouth 2 (two) times daily. What changed:  when to take this   aspirin EC 81 MG tablet Take 81 mg by mouth daily.   celecoxib 200 MG capsule Commonly known as:  CELEBREX Take 200 mg by mouth daily.   dextromethorphan-guaiFENesin 30-600 MG 12hr tablet Commonly known as:  MUCINEX DM Take 1 tablet by mouth 2 (two) times daily.   folic acid 1 MG tablet Commonly known as:  FOLVITE Take 1 mg by mouth daily.   furosemide 40 MG tablet Commonly known as:  LASIX Take 1 tablet (40 mg total) by mouth daily as needed for fluid (see instructions on d/c summary).   HYDROcodone-acetaminophen 5-325 MG tablet Commonly known as:  NORCO/VICODIN Take 1-2 tablets by mouth every 6 (six) hours as needed for moderate pain.   ipratropium-albuterol 0.5-2.5 (3) MG/3ML Soln Commonly known as:  DUONEB Take 3 mLs by nebulization 2 (two) times daily.   losartan 25 MG tablet Commonly known as:  COZAAR Take 1 tablet (25 mg total)  by mouth daily.   methotrexate 2.5 MG tablet Commonly known as:  RHEUMATREX Take 6 tablets by mouth every 7 (seven) days.   metoprolol tartrate 25 MG tablet Commonly known as:  LOPRESSOR Take 1 tablet (25 mg total) by mouth 2 (two) times daily.   polyethylene glycol packet Commonly known as:  MIRALAX / GLYCOLAX Take 17 g by mouth daily as needed for mild constipation.   potassium chloride SA 20 MEQ tablet Commonly known as:  K-DUR,KLOR-CON Take 1 tablet (20 mEq total) by mouth daily as  needed (always take with the Furosemide).   predniSONE 10 MG tablet Commonly known as:  DELTASONE Prednisone 60 mg daily for 3 days followed by  Prednisone 40 mg daily for 3 days followed by  Prednisone 20 mg daily for 3 days.   SENOKOT S 8.6-50 MG tablet Generic drug:  senna-docusate Take 1 tablet by mouth at bedtime as needed for mild constipation.      Follow-up Information    Leola Brazil, DO. Schedule an appointment as soon as possible for a visit in 1 week(s).   Specialty:  Internal Medicine Contact information: [address] [place] [address] [phone]          Allergies  Allergen Reactions  . Oxycodone Base Hives  . Latex Rash    Consultations:  None.    Procedures/Studies: Dg Chest 2 View  Result Date: 02/01/2017 CLINICAL DATA:  Shortness of breath. EXAM: CHEST  2 VIEW COMPARISON:  Radiographs of January 29, 2017. FINDINGS: The heart size and mediastinal contours are within normal limits. Both lungs are clear. No pneumothorax or pleural effusion is noted. The visualized skeletal structures are unremarkable. IMPRESSION: No active cardiopulmonary disease. Electronically Signed   By: Lupita Raider, M.D.   On: 02/01/2017 15:17   Dg Chest 2 View  Result Date: 01/29/2017 CLINICAL DATA:  Shortness of breath for 4 days, upper airway congestion and tachycardic. History CHF EXAM: CHEST  2 VIEW COMPARISON:  Chest radiograph August 05, 2016 FINDINGS: Cardiomediastinal silhouette is normal. No pleural effusions or focal consolidations. Trachea projects midline and there is no pneumothorax. Soft tissue planes and included osseous structures are non-suspicious. Large body habitus. Mild degenerative change of the thoracic spine. IMPRESSION: Negative chest, improved from prior radiograph. Electronically Signed   By: Awilda Metro M.D.   On: 01/29/2017 17:33       Subjective: No sob, chest pain.   Discharge Exam: Vitals:   02/02/17 0420  02/02/17 0804  BP: 132/72   Pulse: 86   Resp: 18   Temp: 97.7 F (36.5 C)   SpO2: 94% 96%   Vitals:   02/01/17 2103 02/01/17 2358 02/02/17 0420 02/02/17 0804  BP:   132/72   Pulse:   86   Resp:   18   Temp:   97.7 F (36.5 C)   TempSrc:   Oral   SpO2: 96% 94% 94% 96%  Weight:      Height:        General: Pt is alert, awake, not in acute distress Cardiovascular: RRR, S1/S2 +, no rubs, no gallops Respiratory: CTA bilaterally, no wheezing, no rhonchi Abdominal: Soft, NT, ND, bowel sounds + Extremities: no edema, no cyanosis    The results of significant diagnostics from this hospitalization (including imaging, microbiology, ancillary and laboratory) are listed below for reference.     Microbiology: Recent Results (from the past 240 hour(s))  Respiratory Panel by PCR  Status: Abnormal   Collection Time: 01/29/17 11:16 PM  Result Value Ref Range Status   Adenovirus NOT DETECTED NOT DETECTED Final   Coronavirus 229E NOT DETECTED NOT DETECTED Final   Coronavirus HKU1 NOT DETECTED NOT DETECTED Final   Coronavirus NL63 NOT DETECTED NOT DETECTED Final   Coronavirus OC43 NOT DETECTED NOT DETECTED Final   Metapneumovirus NOT DETECTED NOT DETECTED Final   Rhinovirus / Enterovirus NOT DETECTED NOT DETECTED Final   Influenza A NOT DETECTED NOT DETECTED Final   Influenza B NOT DETECTED NOT DETECTED Final   Parainfluenza Virus 1 NOT DETECTED NOT DETECTED Final   Parainfluenza Virus 2 NOT DETECTED NOT DETECTED Final   Parainfluenza Virus 3 NOT DETECTED NOT DETECTED Final   Parainfluenza Virus 4 NOT DETECTED NOT DETECTED Final   Respiratory Syncytial Virus DETECTED (A) NOT DETECTED Final    Comment: CRITICAL RESULT CALLED TO, READ BACK BY AND VERIFIED WITH: Claria Dice RN 13:05 01/30/17 ([email or web address])    Bordetella pertussis NOT DETECTED NOT DETECTED Final   Chlamydophila pneumoniae NOT DETECTED NOT DETECTED Final   Mycoplasma pneumoniae NOT DETECTED NOT DETECTED Final    Comment:  Performed at [facility], [address], [place], [place] [address]  Culture, blood (routine x 2) Call MD if unable to obtain prior to antibiotics being given     Status: None   Collection Time: 01/29/17 11:40 PM  Result Value Ref Range Status   Specimen Description BLOOD LEFT ANTECUBITAL  Final   Special Requests   Final    BOTTLES DRAWN AEROBIC AND ANAEROBIC Blood Culture adequate volume   Culture   Final    NO GROWTH 5 DAYS Performed at [facility], [address] [place], [place] [address]    Report Status 02/04/2017 FINAL  Final  Culture, blood (routine x 2) Call MD if unable to obtain prior to antibiotics being given     Status: None   Collection Time: 01/29/17 11:40 PM  Result Value Ref Range Status   Specimen Description BLOOD RIGHT HAND  Final   Special Requests   Final    BOTTLES DRAWN AEROBIC AND ANAEROBIC Blood Culture adequate volume   Culture   Final    NO GROWTH 5 DAYS Performed at [facility], [address], [place], [place] [address]    Report Status 02/04/2017 FINAL  Final     Labs: BNP (last 3 results) Recent Labs    05/17/16 1927 06/17/16 0441 01/29/17 1744  BNP 5.7 14.1 11.9   Basic Metabolic Panel: Recent Labs  Lab 01/30/17 0201 01/30/17 0418 01/31/17 0927  NA  --  134* 137  K  --  5.1 4.1  CL  --  106 105  CO2  --  20* 24  GLUCOSE  --  204* 177*  BUN  --  9 9  CREATININE  --  0.84 0.70  CALCIUM  --  8.4* 8.8*  MG 2.3  --   --    Liver Function Tests: Recent Labs  Lab 01/30/17 0201  AST 60*  ALT 37  ALKPHOS 80  BILITOT 0.3  PROT 8.7*  ALBUMIN 3.9   No results for input(s): LIPASE, AMYLASE in the last 168 hours. No results for input(s): AMMONIA in the last 168 hours. CBC: Recent Labs  Lab 01/30/17 0011  WBC 8.3  NEUTROABS 6.9  HGB 12.4  HCT 37.8  MCV 96.4  PLT 252   Cardiac Enzymes: No results for input(s): CKTOTAL,  CKMB, CKMBINDEX, TROPONINI in the last 168 hours. BNP: Invalid input(s):  POCBNP CBG: No results for input(s): GLUCAP in the last 168 hours. D-Dimer No results for input(s): DDIMER in the last 72 hours. Hgb A1c No results for input(s): HGBA1C in the last 72 hours. Lipid Profile No results for input(s): CHOL, HDL, LDLCALC, TRIG, CHOLHDL, LDLDIRECT in the last 72 hours. Thyroid function studies No results for input(s): TSH, T4TOTAL, T3FREE, THYROIDAB in the last 72 hours.  Invalid input(s): FREET3 Anemia work up No results for input(s): VITAMINB12, FOLATE, FERRITIN, TIBC, IRON, RETICCTPCT in the last 72 hours. Urinalysis    Component Value Date/Time   COLORURINE YELLOW 01/30/2017 0119   APPEARANCEUR CLEAR 01/30/2017 0119   LABSPEC 1.012 01/30/2017 0119   PHURINE 5.0 01/30/2017 0119   GLUCOSEU >=500 (A) 01/30/2017 0119   HGBUR NEGATIVE 01/30/2017 0119   BILIRUBINUR NEGATIVE 01/30/2017 0119   KETONESUR 5 (A) 01/30/2017 0119   PROTEINUR NEGATIVE 01/30/2017 0119   UROBILINOGEN 1.0 03/13/2014 1222   NITRITE NEGATIVE 01/30/2017 0119   LEUKOCYTESUR NEGATIVE 01/30/2017 0119   Sepsis Labs Invalid input(s): PROCALCITONIN,  WBC,  LACTICIDVEN Microbiology Recent Results (from the past 240 hour(s))  Respiratory Panel by PCR     Status: Abnormal   Collection Time: 01/29/17 11:16 PM  Result Value Ref Range Status   Adenovirus NOT DETECTED NOT DETECTED Final   Coronavirus 229E NOT DETECTED NOT DETECTED Final   Coronavirus HKU1 NOT DETECTED NOT DETECTED Final   Coronavirus NL63 NOT DETECTED NOT DETECTED Final   Coronavirus OC43 NOT DETECTED NOT DETECTED Final   Metapneumovirus NOT DETECTED NOT DETECTED Final   Rhinovirus / Enterovirus NOT DETECTED NOT DETECTED Final   Influenza A NOT DETECTED NOT DETECTED Final   Influenza B NOT DETECTED NOT DETECTED Final   Parainfluenza Virus 1 NOT DETECTED NOT DETECTED Final   Parainfluenza Virus 2 NOT DETECTED NOT DETECTED Final   Parainfluenza Virus 3 NOT DETECTED NOT DETECTED Final   Parainfluenza Virus 4 NOT DETECTED  NOT DETECTED Final   Respiratory Syncytial Virus DETECTED (A) NOT DETECTED Final    Comment: CRITICAL RESULT CALLED TO, READ BACK BY AND VERIFIED WITH: Claria Dice RN 13:05 01/30/17 ([email or web address])    Bordetella pertussis NOT DETECTED NOT DETECTED Final   Chlamydophila pneumoniae NOT DETECTED NOT DETECTED Final   Mycoplasma pneumoniae NOT DETECTED NOT DETECTED Final    Comment: Performed at [facility], [address], [place], [place] [address]  Culture, blood (routine x 2) Call MD if unable to obtain prior to antibiotics being given     Status: None   Collection Time: 01/29/17 11:40 PM  Result Value Ref Range Status   Specimen Description BLOOD LEFT ANTECUBITAL  Final   Special Requests   Final    BOTTLES DRAWN AEROBIC AND ANAEROBIC Blood Culture adequate volume   Culture   Final    NO GROWTH 5 DAYS Performed at [facility], [address], [place], [place] [address]    Report Status 02/04/2017 FINAL  Final  Culture, blood (routine x 2) Call MD if unable to obtain prior to antibiotics being given     Status: None   Collection Time: 01/29/17 11:40 PM  Result Value Ref Range Status   Specimen Description BLOOD RIGHT HAND  Final   Special Requests   Final    BOTTLES DRAWN AEROBIC AND ANAEROBIC Blood Culture adequate volume   Culture   Final    NO GROWTH 5 DAYS Performed at  [facility], [address] [place], [place] [address]    Report Status 02/04/2017 FINAL  Final     Time coordinating discharge: Over 30 minutes  SIGNED:   Kathlen ModyVijaya Lashia Niese, MD  [facility] 02/05/2017, 10:28 PM Pager   If 7PM-7AM, please contact night-coverage [email or web address] Password [email or web address]

## 2017-02-22 ENCOUNTER — Encounter (HOSPITAL_BASED_OUTPATIENT_CLINIC_OR_DEPARTMENT_OTHER): Payer: Self-pay

## 2017-02-22 ENCOUNTER — Emergency Department (HOSPITAL_BASED_OUTPATIENT_CLINIC_OR_DEPARTMENT_OTHER): Payer: 59

## 2017-02-22 ENCOUNTER — Other Ambulatory Visit: Payer: Self-pay

## 2017-02-22 ENCOUNTER — Emergency Department (HOSPITAL_BASED_OUTPATIENT_CLINIC_OR_DEPARTMENT_OTHER)
Admission: EM | Admit: 2017-02-22 | Discharge: 2017-02-22 | Disposition: A | Discharge status: Home / Self Care | Payer: 59 | Attending: Emergency Medicine | Admitting: Emergency Medicine

## 2017-02-22 DIAGNOSIS — Z91040 Latex allergy status: Secondary | ICD-10-CM | POA: Insufficient documentation

## 2017-02-22 DIAGNOSIS — Z79899 Other long term (current) drug therapy: Secondary | ICD-10-CM | POA: Insufficient documentation

## 2017-02-22 DIAGNOSIS — Z7982 Long term (current) use of aspirin: Secondary | ICD-10-CM | POA: Insufficient documentation

## 2017-02-22 DIAGNOSIS — I110 Hypertensive heart disease with heart failure: Secondary | ICD-10-CM | POA: Insufficient documentation

## 2017-02-22 DIAGNOSIS — I5032 Chronic diastolic (congestive) heart failure: Secondary | ICD-10-CM | POA: Insufficient documentation

## 2017-02-22 DIAGNOSIS — R079 Chest pain, unspecified: Secondary | ICD-10-CM | POA: Diagnosis not present

## 2017-02-22 DIAGNOSIS — F419 Anxiety disorder, unspecified: Secondary | ICD-10-CM | POA: Diagnosis not present

## 2017-02-22 LAB — CBC WITH DIFFERENTIAL/PLATELET
BASOS PCT: 0 %
Basophils Absolute: 0 10*3/uL (ref 0.0–0.1)
Eosinophils Absolute: 0.1 10*3/uL (ref 0.0–0.7)
Eosinophils Relative: 1 %
HEMATOCRIT: 34.9 % — AB (ref 36.0–46.0)
HEMOGLOBIN: 11.5 g/dL — AB (ref 12.0–15.0)
LYMPHS ABS: 2.2 10*3/uL (ref 0.7–4.0)
LYMPHS PCT: 34 %
MCH: 31.7 pg (ref 26.0–34.0)
MCHC: 33 g/dL (ref 30.0–36.0)
MCV: 96.1 fL (ref 78.0–100.0)
MONO ABS: 0.6 10*3/uL (ref 0.1–1.0)
MONOS PCT: 10 %
NEUTROS ABS: 3.6 10*3/uL (ref 1.7–7.7)
NEUTROS PCT: 55 %
Platelets: 263 10*3/uL (ref 150–400)
RBC: 3.63 MIL/uL — ABNORMAL LOW (ref 3.87–5.11)
RDW: 14.4 % (ref 11.5–15.5)
WBC: 6.5 10*3/uL (ref 4.0–10.5)

## 2017-02-22 LAB — BRAIN NATRIURETIC PEPTIDE: B Natriuretic Peptide: 9.2 pg/mL (ref 0.0–100.0)

## 2017-02-22 LAB — COMPREHENSIVE METABOLIC PANEL WITH GFR
ALT: 33 U/L (ref 14–54)
AST: 25 U/L (ref 15–41)
Albumin: 3.8 g/dL (ref 3.5–5.0)
Alkaline Phosphatase: 71 U/L (ref 38–126)
Anion gap: 12 (ref 5–15)
BUN: 8 mg/dL (ref 6–20)
CO2: 25 mmol/L (ref 22–32)
Calcium: 9.1 mg/dL (ref 8.9–10.3)
Chloride: 102 mmol/L (ref 101–111)
Creatinine, Ser: 0.7 mg/dL (ref 0.44–1.00)
GFR calc Af Amer: >60 mL/min
GFR calc non Af Amer: >60 mL/min
Glucose, Bld: 99 mg/dL (ref 65–99)
Potassium: 3.1 mmol/L — ABNORMAL LOW (ref 3.5–5.1)
Sodium: 139 mmol/L (ref 135–145)
Total Bilirubin: 0.7 mg/dL (ref 0.3–1.2)
Total Protein: 7.7 g/dL (ref 6.5–8.1)

## 2017-02-22 LAB — D-DIMER, QUANTITATIVE (NOT AT ARMC): D DIMER QUANT: 0.36 ug{FEU}/mL (ref 0.00–0.50)

## 2017-02-22 LAB — TROPONIN I: Troponin I: <0.03 ng/mL

## 2017-02-22 MED ORDER — METOPROLOL TARTRATE 50 MG PO TABS
25.0000 mg | ORAL_TABLET | Freq: Once | ORAL | Status: AC
  Administered 2017-02-22: 25 mg via ORAL
  Filled 2017-02-22: qty 1

## 2017-02-22 MED ORDER — HYDROXYZINE HCL 25 MG PO TABS: 50.0000 mg | ORAL_TABLET | Freq: Four times a day (QID) | ORAL | 0 refills | Status: AC | PRN

## 2017-02-22 MED ORDER — CHLORDIAZEPOXIDE HCL 25 MG PO CAPS: 25.0000 mg | ORAL_CAPSULE | Freq: Once | ORAL | Status: DC

## 2017-02-22 NOTE — ED Triage Notes (Signed)
Pt arrived via [facility]. [place] a [place]. Pt was hyperventilating, diaphoretic, and anxious in appearance on their arrival. Pt reports that she has a hx of panic attacks that is similar to this. Pt report being out of her Xanax that she takes for panic. Pt tearful on arrival to ED.

## 2017-02-22 NOTE — ED Notes (Signed)
NAD at this time. Pt is stable and going home.  

## 2017-02-22 NOTE — ED Triage Notes (Signed)
Pt also reports some chest tightness and nausea.

## 2017-02-22 NOTE — ED Provider Notes (Signed)
[facility] EMERGENCY DEPARTMENT Provider Note   CSN: [id number] Arrival date & time: 02/22/17  1544     History   Chief Complaint Chief Complaint  Patient presents with  . Anxiety    HPI Melinda Woods is a 51 y.o. female.  HPI   Presents with concern for chest pain Was [place] courthouse trying to get brother out of jail, around [facility] developed chest pain, sharp pain Nothing makes it better Worse with deep breaths Is located left upper chest, no radiation, is rated 8/10 Reports similar pain with panic attacks in the past Is out of xanax but reports wasn't taking it regularly before. Also reports dyspnea Nausea, vomiting, vomited once, given nausea medicine by EMS which helped Has hx of bilat feet swelling with neuropathy and RA which is not changed No cough or fever Feeling anxious as well, worried about brother   Denies etoh, drug, smokign cigarettes No hx of thyroid problems Has hx of high heart rate in past.  Has not taken daily medications today  Past Medical History:  Diagnosis Date  . Anxiety   . CHF (congestive heart failure) (HCC)   . Fatty liver   . Hypertension   . Neuropathy   . Ovarian cyst   . RA (rheumatoid arthritis) [facility])     Patient Active Problem List   Diagnosis Date Noted  . Acute bronchitis 01/29/2017  . Depression 01/29/2017  . SIRS (systemic inflammatory response syndrome) (HCC) 01/29/2017  . RA (rheumatoid arthritis) (HCC)   . Acute respiratory failure (HCC) 04/26/2016  . Fatty liver 04/25/2016  . Hypertension 04/23/2016  . Chronic diastolic CHF (congestive heart failure) (HCC) 04/23/2016  . Morbid obesity (HCC) 04/23/2016  . Peripheral neuropathy 04/23/2016  . Right sided weakness 03/13/2014  . TIA (transient ischemic attack) 03/13/2014    Past Surgical History:  Procedure Laterality Date  . TUBAL LIGATION      OB History    No data available       Home Medications    Prior to Admission medications     Medication Sig Start Date End Date Taking? Authorizing Provider  amitriptyline (ELAVIL) 100 MG tablet Take 1 tablet (100 mg total) by mouth 2 (two) times daily. Patient taking differently: Take 100 mg by mouth 3 (three) times daily.  04/26/16   Calvert Cantor, MD  aspirin EC 81 MG tablet Take 81 mg by mouth daily.    [provider]  celecoxib (CELEBREX) 200 MG capsule Take 200 mg by mouth daily. 11/20/16   [provider]  dextromethorphan-guaiFENesin (MUCINEX DM) 30-600 MG 12hr tablet Take 1 tablet by mouth 2 (two) times daily. 02/02/17   Kathlen Mody, MD  folic acid (FOLVITE) 1 MG tablet Take 1 mg by mouth daily. 01/23/17   [provider]  furosemide (LASIX) 40 MG tablet Take 1 tablet (40 mg total) by mouth daily as needed for fluid (see instructions on d/c summary). 02/02/17   Kathlen Mody, MD  HYDROcodone-acetaminophen (NORCO/VICODIN) 5-325 MG tablet Take 1-2 tablets by mouth every 6 (six) hours as needed for moderate pain. 02/02/17   Kathlen Mody, MD  hydrOXYzine (ATARAX/VISTARIL) 25 MG tablet Take 2 tablets (50 mg total) by mouth every 6 (six) hours as needed for anxiety. 02/22/17   Alvira Monday, MD  ipratropium-albuterol (DUONEB) 0.5-2.5 (3) MG/3ML SOLN Take 3 mLs by nebulization 2 (two) times daily. 02/02/17   Kathlen Mody, MD  losartan (COZAAR) 25 MG tablet Take 1 tablet (25 mg total) by mouth daily.  04/26/16   Calvert Cantor, MD  methotrexate (RHEUMATREX) 2.5 MG tablet Take 6 tablets by mouth every 7 (seven) days. 01/23/17   [provider]  metoprolol tartrate (LOPRESSOR) 25 MG tablet Take 1 tablet (25 mg total) by mouth 2 (two) times daily. 04/26/16   Calvert Cantor, MD  polyethylene glycol (MIRALAX / GLYCOLAX) packet Take 17 g by mouth daily as needed for mild constipation. Patient not taking: Reported on 01/29/2017 04/26/16   Calvert Cantor, MD  potassium chloride SA (K-DUR,KLOR-CON) 20 MEQ tablet Take 1 tablet (20 mEq total) by mouth daily as needed  (always take with the Furosemide). 04/26/16   Calvert Cantor, MD  predniSONE (DELTASONE) 10 MG tablet Prednisone 60 mg daily for 3 days followed by  Prednisone 40 mg daily for 3 days followed by  Prednisone 20 mg daily for 3 days. 02/02/17   Kathlen Mody, MD  senna-docusate (SENOKOT S) 8.6-50 MG tablet Take 1 tablet by mouth at bedtime as needed for mild constipation.    [provider]    Family History Family History  Problem Relation Age of Onset  . Stroke Mother   . Diabetes Mellitus II Mother   . Lung cancer Father     Social History Social History   Tobacco Use  . Smoking status: Never Smoker  . Smokeless tobacco: Never Used  Substance Use Topics  . Alcohol use: No  . Drug use: No     Allergies   Oxycodone base and Latex   Review of Systems Review of Systems  Constitutional: Negative for fever.  HENT: Negative for sore throat.   Eyes: Negative for visual disturbance.  Respiratory: Positive for shortness of breath. Negative for cough.   Cardiovascular: Positive for chest pain.  Gastrointestinal: Positive for nausea and vomiting. Negative for abdominal pain and diarrhea.  Genitourinary: Negative for difficulty urinating.  Musculoskeletal: Negative for back pain and neck pain.  Skin: Negative for rash.  Neurological: Negative for syncope and headaches.  Psychiatric/Behavioral: The patient is nervous/anxious.      Physical Exam Updated Vital Signs BP 121/90   Pulse (!) 102   Temp 98.6 F (37 C) (Oral)   Resp 19   Ht 5\' 6"  (1.676 m)   Wt 116.6 kg (257 lb)   LMP 02/05/2017   SpO2 96%   BMI 41.48 kg/m   Physical Exam  Constitutional: She is oriented to person, place, and time. She appears well-developed and well-nourished. No distress.  HENT:  Head: Normocephalic and atraumatic.  Eyes: Conjunctivae and EOM are normal.  Neck: Normal range of motion.  Cardiovascular: Regular rhythm, normal heart sounds and intact distal pulses. Tachycardia  present. Exam reveals no gallop and no friction rub.  No murmur heard. Pulmonary/Chest: Effort normal and breath sounds normal. No respiratory distress. She has no wheezes. She has no rales.  Abdominal: Soft. She exhibits no distension. There is no tenderness. There is no guarding.  Musculoskeletal: She exhibits no edema or tenderness.  Neurological: She is alert and oriented to person, place, and time.  Skin: Skin is warm and dry. No rash noted. She is not diaphoretic. No erythema.  Nursing note and vitals reviewed.    ED Treatments / Results  Labs (all labs ordered are listed, but only abnormal results are displayed) Labs Reviewed  CBC WITH DIFFERENTIAL/PLATELET - Abnormal; Notable for the following components:      Result Value   RBC 3.63 (*)    Hemoglobin 11.5 (*)    HCT 34.9 (*)  All other components within normal limits  COMPREHENSIVE METABOLIC PANEL - Abnormal; Notable for the following components:   Potassium 3.1 (*)    All other components within normal limits  BRAIN NATRIURETIC PEPTIDE  TROPONIN I  D-DIMER, QUANTITATIVE (NOT AT [facility])    EKG  EKG Interpretation  Date/Time:  Friday February 22 2017 15:57:30 EST Ventricular Rate:  120 PR Interval:    QRS Duration: 90 QT Interval:  328 QTC Calculation: 464 R Axis:   57 Text Interpretation:  Sinus tachycardia Low voltage, precordial leads Since prior ECG, rate has increased Confirmed by Alvira Monday ([id number]) on 02/22/2017 4:09:32 PM       Radiology Dg Chest 2 View  Result Date: 02/22/2017 CLINICAL DATA:  Shortness of breath and chest pain. EXAM: CHEST  2 VIEW COMPARISON:  02/01/2017 FINDINGS: The heart size and mediastinal contours are within normal limits. Both lungs are clear. The visualized skeletal structures are unremarkable. IMPRESSION: No active cardiopulmonary disease. Electronically Signed   By: Francene Boyers M.D.   On: 02/22/2017 17:38    Procedures Procedures (including critical care  time)  Medications Ordered in ED Medications  metoprolol tartrate (LOPRESSOR) tablet 25 mg (25 mg Oral Given 02/22/17 1744)     Initial Impression / Assessment and Plan / ED Course  I have reviewed the triage vital signs and the nursing notes.  Pertinent labs & imaging results that were available during my care of the patient were reviewed by me and considered in my medical decision making (see chart for details).     51yo female presents with concern for chest pain, dyspnea, [place] her [place].  DDimer negative, doubt PE. XR without acute abnormalities. Troponin negative. Electrolytes and hgb wnl. No sign of CHF.  EKG with sinus tachycardia.  No sign of withdrawal from benzos or etoh by history or physical.   Had not taken metoprolol today, given dose with improvement in HR.  Given rx for hydroxyzine to use prn anxiety.  Recommend PCP follow up. Patient discharged in stable condition with understanding of reasons to return.   Final Clinical Impressions(s) / ED Diagnoses   Final diagnoses:  Anxiety  Chest pain, unspecified type    ED Discharge Orders        Ordered    hydrOXYzine (ATARAX/VISTARIL) 25 MG tablet  Every 6 hours PRN     02/22/17 1835       Alvira Monday, MD 02/23/17 1240

## 2017-04-13 ENCOUNTER — Emergency Department (HOSPITAL_BASED_OUTPATIENT_CLINIC_OR_DEPARTMENT_OTHER): Payer: 59

## 2017-04-13 ENCOUNTER — Observation Stay (HOSPITAL_BASED_OUTPATIENT_CLINIC_OR_DEPARTMENT_OTHER)
Admission: EM | Admit: 2017-04-13 | Discharge: 2017-04-14 | Disposition: A | Discharge status: Home / Self Care | Payer: 59 | Attending: Internal Medicine | Admitting: Internal Medicine

## 2017-04-13 ENCOUNTER — Encounter (HOSPITAL_BASED_OUTPATIENT_CLINIC_OR_DEPARTMENT_OTHER): Payer: Self-pay | Admitting: Emergency Medicine

## 2017-04-13 ENCOUNTER — Other Ambulatory Visit: Payer: Self-pay

## 2017-04-13 DIAGNOSIS — E876 Hypokalemia: Secondary | ICD-10-CM | POA: Insufficient documentation

## 2017-04-13 DIAGNOSIS — Z7982 Long term (current) use of aspirin: Secondary | ICD-10-CM | POA: Diagnosis not present

## 2017-04-13 DIAGNOSIS — I5032 Chronic diastolic (congestive) heart failure: Secondary | ICD-10-CM | POA: Insufficient documentation

## 2017-04-13 DIAGNOSIS — R05 Cough: Secondary | ICD-10-CM

## 2017-04-13 DIAGNOSIS — I110 Hypertensive heart disease with heart failure: Secondary | ICD-10-CM | POA: Diagnosis not present

## 2017-04-13 DIAGNOSIS — Z885 Allergy status to narcotic agent status: Secondary | ICD-10-CM | POA: Diagnosis not present

## 2017-04-13 DIAGNOSIS — J9620 Acute and chronic respiratory failure, unspecified whether with hypoxia or hypercapnia: Secondary | ICD-10-CM | POA: Diagnosis not present

## 2017-04-13 DIAGNOSIS — J4541 Moderate persistent asthma with (acute) exacerbation: Principal | ICD-10-CM | POA: Insufficient documentation

## 2017-04-13 DIAGNOSIS — Z6841 Body Mass Index (BMI) 40.0 and over, adult: Secondary | ICD-10-CM | POA: Diagnosis not present

## 2017-04-13 DIAGNOSIS — J45901 Unspecified asthma with (acute) exacerbation: Secondary | ICD-10-CM | POA: Diagnosis present

## 2017-04-13 DIAGNOSIS — J4551 Severe persistent asthma with (acute) exacerbation: Secondary | ICD-10-CM | POA: Diagnosis not present

## 2017-04-13 DIAGNOSIS — I10 Essential (primary) hypertension: Secondary | ICD-10-CM | POA: Diagnosis present

## 2017-04-13 DIAGNOSIS — Z79899 Other long term (current) drug therapy: Secondary | ICD-10-CM | POA: Diagnosis not present

## 2017-04-13 DIAGNOSIS — J9600 Acute respiratory failure, unspecified whether with hypoxia or hypercapnia: Secondary | ICD-10-CM | POA: Diagnosis present

## 2017-04-13 DIAGNOSIS — M069 Rheumatoid arthritis, unspecified: Secondary | ICD-10-CM | POA: Diagnosis present

## 2017-04-13 DIAGNOSIS — R059 Cough, unspecified: Secondary | ICD-10-CM

## 2017-04-13 HISTORY — DX: Unspecified asthma, uncomplicated: J45.909

## 2017-04-13 LAB — CBC WITH DIFFERENTIAL/PLATELET
Basophils Absolute: 0 10*3/uL (ref 0.0–0.1)
Basophils Relative: 0 %
Eosinophils Absolute: 0.1 10*3/uL (ref 0.0–0.7)
Eosinophils Relative: 1 %
HEMATOCRIT: 36.8 % (ref 36.0–46.0)
HEMOGLOBIN: 12 g/dL (ref 12.0–15.0)
LYMPHS ABS: 2 10*3/uL (ref 0.7–4.0)
LYMPHS PCT: 30 %
MCH: 31.3 pg (ref 26.0–34.0)
MCHC: 32.6 g/dL (ref 30.0–36.0)
MCV: 96.1 fL (ref 78.0–100.0)
MONOS PCT: 4 %
Monocytes Absolute: 0.3 10*3/uL (ref 0.1–1.0)
NEUTROS ABS: 4.2 10*3/uL (ref 1.7–7.7)
NEUTROS PCT: 65 %
Platelets: 266 10*3/uL (ref 150–400)
RBC: 3.83 MIL/uL — ABNORMAL LOW (ref 3.87–5.11)
RDW: 13.7 % (ref 11.5–15.5)
WBC: 6.5 10*3/uL (ref 4.0–10.5)

## 2017-04-13 LAB — COMPREHENSIVE METABOLIC PANEL
ALBUMIN: 3.7 g/dL (ref 3.5–5.0)
ALK PHOS: 68 U/L (ref 38–126)
ALT: 28 U/L (ref 14–54)
ANION GAP: 10 (ref 5–15)
AST: 38 U/L (ref 15–41)
BILIRUBIN TOTAL: 0.3 mg/dL (ref 0.3–1.2)
BUN: 11 mg/dL (ref 6–20)
CO2: 28 mmol/L (ref 22–32)
Calcium: 8.9 mg/dL (ref 8.9–10.3)
Chloride: 100 mmol/L — ABNORMAL LOW (ref 101–111)
Creatinine, Ser: 0.86 mg/dL (ref 0.44–1.00)
GFR calc non Af Amer: >60 mL/min (ref >60)
GLUCOSE: 121 mg/dL — AB (ref 65–99)
Potassium: 3.3 mmol/L — ABNORMAL LOW (ref 3.5–5.1)
Sodium: 138 mmol/L (ref 135–145)
Total Protein: 7.9 g/dL (ref 6.5–8.1)

## 2017-04-13 LAB — INFLUENZA PANEL BY PCR (TYPE A & B)
INFLBPCR: NEGATIVE
Influenza A By PCR: NEGATIVE

## 2017-04-13 LAB — BRAIN NATRIURETIC PEPTIDE: B Natriuretic Peptide: 17.9 pg/mL (ref 0.0–100.0)

## 2017-04-13 MED ORDER — IPRATROPIUM-ALBUTEROL 0.5-2.5 (3) MG/3ML IN SOLN
RESPIRATORY_TRACT | Status: AC
  Administered 2017-04-13: 3 mL via RESPIRATORY_TRACT
  Filled 2017-04-13: qty 3

## 2017-04-13 MED ORDER — IPRATROPIUM BROMIDE 0.02 % IN SOLN
0.5000 mg | Freq: Once | RESPIRATORY_TRACT | Status: AC
  Administered 2017-04-13: 0.5 mg via RESPIRATORY_TRACT
  Filled 2017-04-13: qty 2.5

## 2017-04-13 MED ORDER — ALBUTEROL SULFATE (2.5 MG/3ML) 0.083% IN NEBU: 2.5000 mg | INHALATION_SOLUTION | Freq: Four times a day (QID) | RESPIRATORY_TRACT | Status: DC

## 2017-04-13 MED ORDER — ASPIRIN EC 81 MG PO TBEC
81.0000 mg | DELAYED_RELEASE_TABLET | Freq: Every day | ORAL | Status: DC
  Administered 2017-04-13 – 2017-04-14 (×2): 81 mg via ORAL
  Filled 2017-04-13 (×2): qty 1

## 2017-04-13 MED ORDER — ALBUTEROL (5 MG/ML) CONTINUOUS INHALATION SOLN
10.0000 mg/h | INHALATION_SOLUTION | RESPIRATORY_TRACT | Status: DC
  Administered 2017-04-13 (×2): 10 mg/h via RESPIRATORY_TRACT
  Filled 2017-04-13: qty 20

## 2017-04-13 MED ORDER — SODIUM CHLORIDE 0.9 % IV SOLN
INTRAVENOUS | Status: DC
  Administered 2017-04-13: 20:00:00 via INTRAVENOUS

## 2017-04-13 MED ORDER — METHYLPREDNISOLONE SODIUM SUCC 125 MG IJ SOLR
125.0000 mg | Freq: Four times a day (QID) | INTRAMUSCULAR | Status: DC
  Administered 2017-04-13 – 2017-04-14 (×4): 125 mg via INTRAVENOUS
  Filled 2017-04-13 (×4): qty 2

## 2017-04-13 MED ORDER — ALBUTEROL SULFATE HFA 108 (90 BASE) MCG/ACT IN AERS: 1.0000 | INHALATION_SPRAY | Freq: Four times a day (QID) | RESPIRATORY_TRACT | Status: DC | PRN

## 2017-04-13 MED ORDER — MAGNESIUM SULFATE 2 GM/50ML IV SOLN
2.0000 g | Freq: Once | INTRAVENOUS | Status: AC
  Administered 2017-04-13: 2 g via INTRAVENOUS
  Filled 2017-04-13: qty 50

## 2017-04-13 MED ORDER — ALBUTEROL SULFATE (2.5 MG/3ML) 0.083% IN NEBU
INHALATION_SOLUTION | RESPIRATORY_TRACT | Status: AC
  Administered 2017-04-13: 2.5 mg via RESPIRATORY_TRACT
  Filled 2017-04-13: qty 3

## 2017-04-13 MED ORDER — ONDANSETRON HCL 4 MG PO TABS: 4.0000 mg | ORAL_TABLET | Freq: Four times a day (QID) | ORAL | Status: DC | PRN

## 2017-04-13 MED ORDER — IPRATROPIUM-ALBUTEROL 0.5-2.5 (3) MG/3ML IN SOLN
3.0000 mL | Freq: Four times a day (QID) | RESPIRATORY_TRACT | Status: DC
  Administered 2017-04-13 – 2017-04-14 (×3): 3 mL via RESPIRATORY_TRACT
  Filled 2017-04-13 (×3): qty 3

## 2017-04-13 MED ORDER — ALBUTEROL SULFATE (2.5 MG/3ML) 0.083% IN NEBU: 2.5000 mg | INHALATION_SOLUTION | RESPIRATORY_TRACT | Status: DC | PRN

## 2017-04-13 MED ORDER — ALBUTEROL SULFATE (2.5 MG/3ML) 0.083% IN NEBU
2.5000 mg | INHALATION_SOLUTION | RESPIRATORY_TRACT | Status: DC
  Administered 2017-04-13: 2.5 mg via RESPIRATORY_TRACT
  Filled 2017-04-13: qty 3

## 2017-04-13 MED ORDER — POTASSIUM CHLORIDE CRYS ER 20 MEQ PO TBCR
40.0000 meq | EXTENDED_RELEASE_TABLET | Freq: Once | ORAL | Status: AC
Start: 2017-04-13 — End: 2017-04-13
  Administered 2017-04-13: 40 meq via ORAL
  Filled 2017-04-13: qty 2

## 2017-04-13 MED ORDER — ENOXAPARIN SODIUM 40 MG/0.4ML ~~LOC~~ SOLN
40.0000 mg | SUBCUTANEOUS | Status: DC
  Administered 2017-04-13: 40 mg via SUBCUTANEOUS
  Filled 2017-04-13: qty 0.4

## 2017-04-13 MED ORDER — ONDANSETRON HCL 4 MG/2ML IJ SOLN: 4.0000 mg | Freq: Four times a day (QID) | INTRAMUSCULAR | Status: DC | PRN

## 2017-04-13 MED ORDER — ALBUTEROL SULFATE (2.5 MG/3ML) 0.083% IN NEBU
2.5000 mg | INHALATION_SOLUTION | Freq: Once | RESPIRATORY_TRACT | Status: AC
  Administered 2017-04-13: 2.5 mg via RESPIRATORY_TRACT

## 2017-04-13 MED ORDER — HYDROCODONE-ACETAMINOPHEN 5-325 MG PO TABS
1.0000 | ORAL_TABLET | Freq: Four times a day (QID) | ORAL | Status: DC | PRN
  Administered 2017-04-13 – 2017-04-14 (×4): 2 via ORAL
  Filled 2017-04-13 (×4): qty 2

## 2017-04-13 MED ORDER — PREDNISONE 50 MG PO TABS
60.0000 mg | ORAL_TABLET | Freq: Once | ORAL | Status: AC
  Administered 2017-04-13: 13:00:00 60 mg via ORAL
  Filled 2017-04-13: qty 1

## 2017-04-13 MED ORDER — IPRATROPIUM-ALBUTEROL 0.5-2.5 (3) MG/3ML IN SOLN
3.0000 mL | Freq: Once | RESPIRATORY_TRACT | Status: AC
  Administered 2017-04-13: 3 mL via RESPIRATORY_TRACT

## 2017-04-13 MED ORDER — IPRATROPIUM BROMIDE 0.02 % IN SOLN: 0.5000 mg | Freq: Four times a day (QID) | RESPIRATORY_TRACT | Status: DC

## 2017-04-13 NOTE — ED Provider Notes (Signed)
[facility] EMERGENCY DEPARTMENT Provider Note  CSN: [id number] Arrival date & time: 04/13/17 1207  Chief Complaint(s) Shortness of Breath and Cough  HPI Melinda Woods is a 52 y.o. female   The history is provided by the patient.  Shortness of Breath  This is a recurrent problem. The average episode lasts 2 weeks. The problem occurs continuously.The problem has been gradually worsening. Associated symptoms include rhinorrhea, sore throat, cough, sputum production, hemoptysis (this am) and chest pain (only with coughing). Pertinent negatives include no fever, no headaches and no leg swelling. She has tried beta-agonist inhalers for the symptoms. The treatment provided no relief. Associated medical issues include asthma and heart failure (grade 1 dHF). Associated medical issues do not include PE, past MI or DVT.  Cough  Associated symptoms include chest pain (only with coughing), rhinorrhea, sore throat and shortness of breath. Pertinent negatives include no headaches. Her past medical history is significant for asthma.    Past Medical History Past Medical History:  Diagnosis Date  . Anxiety   . Asthma   . CHF (congestive heart failure) (HCC)   . Fatty liver   . Hypertension   . Neuropathy   . Ovarian cyst   . RA (rheumatoid arthritis) [facility])    Patient Active Problem List   Diagnosis Date Noted  . Acute bronchitis 01/29/2017  . Depression 01/29/2017  . SIRS (systemic inflammatory response syndrome) (HCC) 01/29/2017  . RA (rheumatoid arthritis) (HCC)   . Acute respiratory failure (HCC) 04/26/2016  . Fatty liver 04/25/2016  . Hypertension 04/23/2016  . Chronic diastolic CHF (congestive heart failure) (HCC) 04/23/2016  . Morbid obesity (HCC) 04/23/2016  . Peripheral neuropathy 04/23/2016  . Right sided weakness 03/13/2014  . TIA (transient ischemic attack) 03/13/2014   Home Medication(s) Prior to Admission medications   Medication Sig Start Date End Date Taking?  Authorizing Provider  amitriptyline (ELAVIL) 100 MG tablet Take 1 tablet (100 mg total) by mouth 2 (two) times daily. Patient taking differently: Take 100 mg by mouth 3 (three) times daily.  04/26/16   Calvert Cantor, MD  aspirin EC 81 MG tablet Take 81 mg by mouth daily.    [provider]  celecoxib (CELEBREX) 200 MG capsule Take 200 mg by mouth daily. 11/20/16   [provider]  dextromethorphan-guaiFENesin (MUCINEX DM) 30-600 MG 12hr tablet Take 1 tablet by mouth 2 (two) times daily. 02/02/17   Kathlen Mody, MD  folic acid (FOLVITE) 1 MG tablet Take 1 mg by mouth daily. 01/23/17   [provider]  furosemide (LASIX) 40 MG tablet Take 1 tablet (40 mg total) by mouth daily as needed for fluid (see instructions on d/c summary). 02/02/17   Kathlen Mody, MD  HYDROcodone-acetaminophen (NORCO/VICODIN) 5-325 MG tablet Take 1-2 tablets by mouth every 6 (six) hours as needed for moderate pain. 02/02/17   Kathlen Mody, MD  hydrOXYzine (ATARAX/VISTARIL) 25 MG tablet Take 2 tablets (50 mg total) by mouth every 6 (six) hours as needed for anxiety. 02/22/17   Alvira Monday, MD  ipratropium-albuterol (DUONEB) 0.5-2.5 (3) MG/3ML SOLN Take 3 mLs by nebulization 2 (two) times daily. 02/02/17   Kathlen Mody, MD  losartan (COZAAR) 25 MG tablet Take 1 tablet (25 mg total) by mouth daily. 04/26/16   Calvert Cantor, MD  methotrexate (RHEUMATREX) 2.5 MG tablet Take 6 tablets by mouth every 7 (seven) days. 01/23/17   [provider]  metoprolol tartrate (LOPRESSOR) 25 MG tablet Take 1 tablet (25 mg total) by mouth 2 (  two) times daily. 04/26/16   Calvert Cantor, MD  polyethylene glycol (MIRALAX / GLYCOLAX) packet Take 17 g by mouth daily as needed for mild constipation. Patient not taking: Reported on 01/29/2017 04/26/16   Calvert Cantor, MD  potassium chloride SA (K-DUR,KLOR-CON) 20 MEQ tablet Take 1 tablet (20 mEq total) by mouth daily as needed (always take with the Furosemide). 04/26/16   Calvert Cantor, MD  predniSONE (DELTASONE) 10 MG tablet Prednisone 60 mg daily for 3 days followed by  Prednisone 40 mg daily for 3 days followed by  Prednisone 20 mg daily for 3 days. 02/02/17   Kathlen Mody, MD  senna-docusate (SENOKOT S) 8.6-50 MG tablet Take 1 tablet by mouth at bedtime as needed for mild constipation.    [provider]                                                                                                                                    Past Surgical History Past Surgical History:  Procedure Laterality Date  . TUBAL LIGATION     Family History Family History  Problem Relation Age of Onset  . Stroke Mother   . Diabetes Mellitus II Mother   . Lung cancer Father     Social History Social History   Tobacco Use  . Smoking status: Never Smoker  . Smokeless tobacco: Never Used  Substance Use Topics  . Alcohol use: No  . Drug use: No   Allergies Oxycodone base and Latex  Review of Systems Review of Systems  Constitutional: Negative for fever.  HENT: Positive for rhinorrhea and sore throat.   Respiratory: Positive for cough, hemoptysis (this am), sputum production and shortness of breath.   Cardiovascular: Positive for chest pain (only with coughing). Negative for leg swelling.  Neurological: Negative for headaches.   All other systems are reviewed and are negative for acute change except as noted in the HPI  Physical Exam Vital Signs  I have reviewed the triage vital signs BP 117/81 (BP Location: Left Arm)   Pulse (!) 112   Temp 98.7 F (37.1 C) (Oral)   Resp (!) 24   Ht 5\' 6"  (1.676 m)   Wt 109.8 kg (242 lb)   LMP 03/13/2017   SpO2 91%   BMI 39.06 kg/m   Physical Exam  Constitutional: She is oriented to person, place, and time. She appears well-developed and well-nourished. No distress.  HENT:  Head: Normocephalic and atraumatic.  Nose: Nose normal.  Eyes: Conjunctivae and EOM are normal. Pupils are equal, round, and reactive to  light. Right eye exhibits no discharge. Left eye exhibits no discharge. No scleral icterus.  Neck: Normal range of motion. Neck supple.  Cardiovascular: Normal rate and regular rhythm. Exam reveals no gallop and no friction rub.  No murmur heard. Pulmonary/Chest: Effort normal. No stridor. No respiratory distress. She has wheezes in the right upper field, the right middle field,  the right lower field, the left upper field, the left middle field and the left lower field. She has rhonchi in the right upper field, the right middle field, the left upper field, the left middle field and the left lower field. She has no rales.  Abdominal: Soft. She exhibits no distension. There is no tenderness.  Musculoskeletal: She exhibits no edema or tenderness.  No edema  Neurological: She is alert and oriented to person, place, and time.  Skin: Skin is warm and dry. No rash noted. She is not diaphoretic. No erythema.  Psychiatric: She has a normal mood and affect.  Vitals reviewed.   ED Results and Treatments Labs (all labs ordered are listed, but only abnormal results are displayed) Labs Reviewed  CBC WITH DIFFERENTIAL/PLATELET - Abnormal; Notable for the following components:      Result Value   RBC 3.83 (*)    All other components within normal limits  COMPREHENSIVE METABOLIC PANEL - Abnormal; Notable for the following components:   Potassium 3.3 (*)    Chloride 100 (*)    Glucose, Bld 121 (*)    All other components within normal limits  CULTURE, EXPECTORATED SPUTUM-ASSESSMENT  BRAIN NATRIURETIC PEPTIDE  INFLUENZA PANEL BY PCR (TYPE A & B)                                                                                                                         EKG  EKG Interpretation  Date/Time:  Saturday April 13 2017 12:24:46 EST Ventricular Rate:  97 PR Interval:    QRS Duration: 100 QT Interval:  377 QTC Calculation: 479 R Axis:   53 Text Interpretation:  Sinus rhythm Low voltage,  precordial leads No significant change since last tracing Confirmed by Drema Pry [phone]) on 04/13/2017 12:29:06 PM      Radiology Dg Chest 2 View  Result Date: 04/13/2017 CLINICAL DATA:  Cough.  History of asthma. EXAM: CHEST  2 VIEW COMPARISON:  02/22/2017 FINDINGS: Mild central peribronchial thickening in the left hilum is unchanged. Subtle densities in the left lower chest are unchanged. Subtle densities along the anterior lower chest are also similar to the previous examination. No new airspace disease or consolidation. Heart and mediastinum are within normal limits. Trachea is midline. No acute osseous abnormality. IMPRESSION: Chronic peribronchial thickening without acute findings. Electronically Signed   By: Richarda Overlie M.D.   On: 04/13/2017 13:00   Pertinent labs & imaging results that were available during my care of the patient were reviewed by me and considered in my medical decision making (see chart for details).  Medications Ordered in ED Medications  albuterol (PROVENTIL,VENTOLIN) solution continuous neb (10 mg/hr Nebulization New Bag/Given 04/13/17 1424)  potassium chloride SA (K-DUR,KLOR-CON) CR tablet 40 mEq (not administered)  ipratropium-albuterol (DUONEB) 0.5-2.5 (3) MG/3ML nebulizer solution 3 mL (3 mLs Nebulization Given 04/13/17 1225)  albuterol (PROVENTIL) (2.5 MG/3ML) 0.083% nebulizer solution 2.5 mg (2.5 mg Nebulization Given 04/13/17 1225)  predniSONE (DELTASONE) tablet 60  mg (60 mg Oral Given 04/13/17 1326)  ipratropium (ATROVENT) nebulizer solution 0.5 mg (0.5 mg Nebulization Given 04/13/17 1253)  magnesium sulfate IVPB 2 g 50 mL (2 g Intravenous New Bag/Given 04/13/17 1459)                                                                                                                                    Procedures Procedures CRITICAL CARE Performed by: Amadeo Garnet Cardama Total critical care time: 45 minutes Critical care time was exclusive of separately  billable procedures and treating other patients. Critical care was necessary to treat or prevent imminent or life-threatening deterioration. Critical care was time spent personally by me on the following activities: development of treatment plan with patient and/or surrogate as well as nursing, discussions with consultants, evaluation of patient's response to treatment, examination of patient, obtaining history from patient or surrogate, ordering and performing treatments and interventions, ordering and review of laboratory studies, ordering and review of radiographic studies, pulse oximetry and re-evaluation of patient's condition.   (including critical care time)  Medical Decision Making / ED Course I have reviewed the nursing notes for this encounter and the patient's prior records (if available in EHR or on provided paperwork).    Presentation is consistent with asthma exacerbation.  Chest x-ray without evidence of focal pneumonia.  No evidence of volume overload to suggest CHF exacerbation.  Labs grossly reassuring without evidence of.  BNP within normal limits.  Patient was given 1 DuoNeb.  On reassessment patient still with significant increased work of breathing and wheezing.  She was placed on continuous nebulizer and given steroids.  On reassessment, patient continues to have increased work of breathing with significant wheezing and was noted to have desaturations.  Second round of continuous nebs were ordered and magnesium was given.  On reassessment patient has significant improvement in her work of breathing and air movement as well as wheezing.  Patient was still the setting on room air.  Placed on 2 L nasal cannula with improved saturations. Patient will require admission for continued management.  Will discuss case with hospitalist.  Dr. Thedore Mins accepted to [facility].  Final Clinical Impression(s) / ED Diagnoses Final diagnoses:  Cough  Moderate persistent asthma with exacerbation       This chart was dictated using voice recognition software.  Despite best efforts to proofread,  errors can occur which can change the documentation meaning.   Nira Conn, MD 04/13/17 [phone]

## 2017-04-13 NOTE — H&P (Signed)
History and Physical    Melinda Woods XKG:[id number] DOB: 30-Nov-1965 DOA: 04/13/2017  Referring MD/NP/PA: Dr Eudelia Bunch  PCP: Leola Brazil, DO   Outpatient Specialists: None   Patient coming from: [facility] from home  Chief Complaint: Shortness of breath  HPI: Melinda Woods is a 52 y.o. female with medical history significant of asthma and morbid obesity who underwent to [facility] [place] was complaining of shortness of breath on and off for the last 3 weeks. This got worse in the last 2 days associated with severe wheezing and cough. Patient denied any chest pain. She denied any sick contacts. Patient has been using her nebulizer at home was usual but no relief. She had a similar episode back in December when she was found to have asthma exacerbation. Patient was seen and evaluated and she was transferred to [facility] for inpatient care.   ED Course: Patient received IV Solu-Medrol with nebulizer and oxygen. She was transferred for inpatient care  Review of Systems: As per HPI otherwise 10 point review of systems negative.    Past Medical History:  Diagnosis Date  . Anxiety   . Asthma   . CHF (congestive heart failure) (HCC)   . Fatty liver   . Hypertension   . Neuropathy   . Ovarian cyst   . RA (rheumatoid arthritis) (HCC)     Past Surgical History:  Procedure Laterality Date  . TUBAL LIGATION       reports that  has never smoked. she has never used smokeless tobacco. She reports that she does not drink alcohol or use drugs.  Allergies  Allergen Reactions  . Oxycodone Base Hives  . Latex Rash    Family History  Problem Relation Age of Onset  . Stroke Mother   . Diabetes Mellitus II Mother   . Lung cancer Father     Prior to Admission medications   Medication Sig Start Date End Date Taking? Authorizing Provider  albuterol (PROVENTIL HFA;VENTOLIN HFA) 108 (90 Base) MCG/ACT inhaler Inhale 1-2 puffs into the lungs every 6 (six) hours as needed for wheezing  or shortness of breath.   Yes [provider]  amitriptyline (ELAVIL) 100 MG tablet Take 1 tablet (100 mg total) by mouth 2 (two) times daily. Patient taking differently: Take 100 mg by mouth 3 (three) times daily.  04/26/16  Yes Calvert Cantor, MD  docusate sodium (COLACE) 100 MG capsule Take 100 mg by mouth 2 (two) times daily as needed (for constipation).   Yes [provider]  folic acid (FOLVITE) 1 MG tablet Take 1 mg by mouth daily. 01/23/17  Yes [provider]  furosemide (LASIX) 40 MG tablet Take 1 tablet (40 mg total) by mouth daily as needed for fluid (see instructions on d/c summary). Patient taking differently: Take 40 mg by mouth daily.  02/02/17  Yes Kathlen Mody, MD  HYDROcodone-acetaminophen (NORCO/VICODIN) 5-325 MG tablet Take 1-2 tablets by mouth every 6 (six) hours as needed for moderate pain. 02/02/17  Yes Kathlen Mody, MD  ipratropium-albuterol (DUONEB) 0.5-2.5 (3) MG/3ML SOLN Take 3 mLs by nebulization 2 (two) times daily. 02/02/17  Yes Kathlen Mody, MD  losartan (COZAAR) 25 MG tablet Take 1 tablet (25 mg total) by mouth daily. 04/26/16  Yes Calvert Cantor, MD  methotrexate (RHEUMATREX) 2.5 MG tablet Take 7.5 mg by mouth 2 (two) times a week. Fridays & Saturdays morning 01/23/17  Yes [provider]  metoprolol tartrate (LOPRESSOR) 25 MG tablet Take 1 tablet (25  mg total) by mouth 2 (two) times daily. 04/26/16  Yes Calvert Cantor, MD  polyethylene glycol (MIRALAX / GLYCOLAX) packet Take 17 g by mouth daily as needed for mild constipation. Patient taking differently: Take 17 g by mouth daily as needed for mild constipation. For constipation 04/26/16  Yes Calvert Cantor, MD  aspirin EC 81 MG tablet Take 81 mg by mouth daily.    [provider]  dextromethorphan-guaiFENesin (MUCINEX DM) 30-600 MG 12hr tablet Take 1 tablet by mouth 2 (two) times daily. Patient not taking: Reported on 04/13/2017 02/02/17   Kathlen Mody, MD  hydrOXYzine  (ATARAX/VISTARIL) 25 MG tablet Take 2 tablets (50 mg total) by mouth every 6 (six) hours as needed for anxiety. Patient not taking: Reported on 04/13/2017 02/22/17   Alvira Monday, MD  potassium chloride SA (K-DUR,KLOR-CON) 20 MEQ tablet Take 1 tablet (20 mEq total) by mouth daily as needed (always take with the Furosemide). Patient not taking: Reported on 04/13/2017 04/26/16   Calvert Cantor, MD  predniSONE (DELTASONE) 10 MG tablet Prednisone 60 mg daily for 3 days followed by  Prednisone 40 mg daily for 3 days followed by  Prednisone 20 mg daily for 3 days. Patient not taking: Reported on 04/13/2017 02/02/17   Kathlen Mody, MD    Physical Exam: Vitals:   04/13/17 1700 04/13/17 1704 04/13/17 1730 04/13/17 1826  BP: 104/66  112/67 137/66  Pulse: (!) 115  (!) 121 (!) 128  Resp: 19  20   Temp:    98.2 F (36.8 C)  TempSrc:    Oral  SpO2: 97% 97% 98% 97%  Weight:    120.3 kg (265 lb 3.4 oz)  Height:    5\' 6"  (1.676 m)      Constitutional: NAD, calm, comfortable Vitals:   04/13/17 1700 04/13/17 1704 04/13/17 1730 04/13/17 1826  BP: 104/66  112/67 137/66  Pulse: (!) 115  (!) 121 (!) 128  Resp: 19  20   Temp:    98.2 F (36.8 C)  TempSrc:    Oral  SpO2: 97% 97% 98% 97%  Weight:    120.3 kg (265 lb 3.4 oz)  Height:    5\' 6"  (1.676 m)   Eyes: PERRL, lids and conjunctivae normal ENMT: Mucous membranes are moist. Posterior pharynx clear of any exudate or lesions.Normal dentition.  Neck: normal, supple, no masses, no thyromegaly Respiratory: Decreased air entry was widespread expiratory wheezing, no crackles. Increased respiratory effort. Patient has accessory muscle use.  Cardiovascular: Regular rate and rhythm, no murmurs / rubs / gallops. No extremity edema. 2+ pedal pulses. No carotid bruits.  Abdomen: no tenderness, no masses palpated. No hepatosplenomegaly. Bowel sounds positive.  Musculoskeletal: no clubbing / cyanosis. No joint deformity upper and lower extremities. Good ROM,  no contractures. Normal muscle tone.  Skin: no rashes, lesions, ulcers. No induration Neurologic: CN 2-12 grossly intact. Sensation intact, DTR normal. Strength 5/5 in all 4.  Psychiatric: Normal judgment and insight. Alert and oriented x 3. Normal mood.    Labs on Admission: I have personally reviewed following labs and imaging studies  CBC: Recent Labs  Lab 04/13/17 1448  WBC 6.5  NEUTROABS 4.2  HGB 12.0  HCT 36.8  MCV 96.1  PLT 266   Basic Metabolic Panel: Recent Labs  Lab 04/13/17 1448  NA 138  K 3.3*  CL 100*  CO2 28  GLUCOSE 121*  BUN 11  CREATININE 0.86  CALCIUM 8.9   GFR: Estimated Creatinine Clearance: 103.4 mL/min (by C-G  formula based on SCr of 0.86 mg/dL). Liver Function Tests: Recent Labs  Lab 04/13/17 1448  AST 38  ALT 28  ALKPHOS 68  BILITOT 0.3  PROT 7.9  ALBUMIN 3.7   No results for input(s): LIPASE, AMYLASE in the last 168 hours. No results for input(s): AMMONIA in the last 168 hours. Coagulation Profile: No results for input(s): INR, PROTIME in the last 168 hours. Cardiac Enzymes: No results for input(s): CKTOTAL, CKMB, CKMBINDEX, TROPONINI in the last 168 hours. BNP (last 3 results) No results for input(s): PROBNP in the last 8760 hours. HbA1C: No results for input(s): HGBA1C in the last 72 hours. CBG: No results for input(s): GLUCAP in the last 168 hours. Lipid Profile: No results for input(s): CHOL, HDL, LDLCALC, TRIG, CHOLHDL, LDLDIRECT in the last 72 hours. Thyroid Function Tests: No results for input(s): TSH, T4TOTAL, FREET4, T3FREE, THYROIDAB in the last 72 hours. Anemia Panel: No results for input(s): VITAMINB12, FOLATE, FERRITIN, TIBC, IRON, RETICCTPCT in the last 72 hours. Urine analysis:    Component Value Date/Time   COLORURINE YELLOW 01/30/2017 0119   APPEARANCEUR CLEAR 01/30/2017 0119   LABSPEC 1.012 01/30/2017 0119   PHURINE 5.0 01/30/2017 0119   GLUCOSEU >=500 (A) 01/30/2017 0119   HGBUR NEGATIVE 01/30/2017 0119    BILIRUBINUR NEGATIVE 01/30/2017 0119   KETONESUR 5 (A) 01/30/2017 0119   PROTEINUR NEGATIVE 01/30/2017 0119   UROBILINOGEN 1.0 03/13/2014 1222   NITRITE NEGATIVE 01/30/2017 0119   LEUKOCYTESUR NEGATIVE 01/30/2017 0119   Sepsis Labs: @LABRCNTIP (procalcitonin:4,lacticidven:4) )No results found for this or any previous visit (from the past 240 hour(s)).   Radiological Exams on Admission: Dg Chest 2 View  Result Date: 04/13/2017 CLINICAL DATA:  Cough.  History of asthma. EXAM: CHEST  2 VIEW COMPARISON:  02/22/2017 FINDINGS: Mild central peribronchial thickening in the left hilum is unchanged. Subtle densities in the left lower chest are unchanged. Subtle densities along the anterior lower chest are also similar to the previous examination. No new airspace disease or consolidation. Heart and mediastinum are within normal limits. Trachea is midline. No acute osseous abnormality. IMPRESSION: Chronic peribronchial thickening without acute findings. Electronically Signed   By: 02/24/2017 M.D.   On: 04/13/2017 13:00    EKG: Independently reviewed.   Assessment/Plan Principal Problem:   Asthma exacerbation Active Problems:   Hypertension   Chronic diastolic CHF (congestive heart failure) (HCC)   Morbid obesity (HCC)   Acute respiratory failure (HCC)   RA (rheumatoid arthritis) (HCC)   Asthma exacerbation attacks   Hypokalemia    #1 acute on chronic respiratory failure: This is secondary to asthma exacerbation. Patient will be treated for the asthma exacerbation. Monitor and oxygen.  #2 acute asthma exacerbation: We'll initiate IVs Solu-Medrol as well as DuoNeb. Continue oxygenation.  #3 hypertension: Blood pressure appears controlled. Continue home regimen  #4 morbid obesity: Counseling provided.  #5 hypokalemia: Replete potassium.  #6 history of rheumatoid arthritis: Patient on methotrexate every Friday.  #7 chronic diastolic CHF: Continue gentle diuresis.   DVT prophylaxis:  Lovenox  Code Status: Full code   Family Communication: None available   Disposition Plan: Home when ready   Consults called: None  Admission status: Inpatient   Severity of Illness: The appropriate patient status for this patient is INPATIENT. Inpatient status is judged to be reasonable and necessary in order to provide the required intensity of service to ensure the patient's safety. The patient's presenting symptoms, physical exam findings, and initial radiographic and laboratory data in the context  of their chronic comorbidities is felt to place them at high risk for further clinical deterioration. Furthermore, it is not anticipated that the patient will be medically stable for discharge from the hospital within 2 midnights of admission. The following factors support the patient status of inpatient.   " The patient's presenting symptoms include shortness of breath. " The worrisome physical exam findings include negative chest x-ray. " The initial radiographic and laboratory data are worrisome because of hypoxemia. " The chronic co-morbidities include known history of asthma.   * I certify that at the point of admission it is my clinical judgment that the patient will require inpatient hospital care spanning beyond 2 midnights from the point of admission due to high intensity of service, high risk for further deterioration and high frequency of surveillance required.Lonia Blood MD [facility] Pager [phone]  If 7PM-7AM, please contact night-coverage [email or web address] Password [facility]  04/13/2017, 7:42 PM

## 2017-04-13 NOTE — ED Triage Notes (Signed)
Cough x 2 weeks. States she coughed up blood this morning. Hx of asthma.

## 2017-04-13 NOTE — ED Notes (Signed)
ED Provider at bedside. 

## 2017-04-13 NOTE — ED Notes (Signed)
Per RRT pt SpO2 drops to upper 80's. Pt was place on 2L via N/C

## 2017-04-13 NOTE — Plan of Care (Signed)
  Pain Managment: General experience of comfort will improve 04/13/2017 2340 - Progressing by Antionette Char, RN

## 2017-04-14 DIAGNOSIS — E876 Hypokalemia: Secondary | ICD-10-CM

## 2017-04-14 DIAGNOSIS — J4521 Mild intermittent asthma with (acute) exacerbation: Secondary | ICD-10-CM | POA: Diagnosis not present

## 2017-04-14 DIAGNOSIS — I5032 Chronic diastolic (congestive) heart failure: Secondary | ICD-10-CM

## 2017-04-14 LAB — COMPREHENSIVE METABOLIC PANEL
ALBUMIN: 3.5 g/dL (ref 3.5–5.0)
ALK PHOS: 60 U/L (ref 38–126)
ALT: 26 U/L (ref 14–54)
ANION GAP: 12 (ref 5–15)
AST: 36 U/L (ref 15–41)
BILIRUBIN TOTAL: 0.3 mg/dL (ref 0.3–1.2)
BUN: 10 mg/dL (ref 6–20)
CALCIUM: 9.1 mg/dL (ref 8.9–10.3)
CO2: 21 mmol/L — ABNORMAL LOW (ref 22–32)
Chloride: 103 mmol/L (ref 101–111)
Creatinine, Ser: 0.77 mg/dL (ref 0.44–1.00)
GLUCOSE: 231 mg/dL — AB (ref 65–99)
POTASSIUM: 3.9 mmol/L (ref 3.5–5.1)
Sodium: 136 mmol/L (ref 135–145)
TOTAL PROTEIN: 7.4 g/dL (ref 6.5–8.1)

## 2017-04-14 LAB — CBC
HEMATOCRIT: 36.1 % (ref 36.0–46.0)
Hemoglobin: 11.6 g/dL — ABNORMAL LOW (ref 12.0–15.0)
MCH: 31.1 pg (ref 26.0–34.0)
MCHC: 32.1 g/dL (ref 30.0–36.0)
MCV: 96.8 fL (ref 78.0–100.0)
Platelets: 307 10*3/uL (ref 150–400)
RBC: 3.73 MIL/uL — AB (ref 3.87–5.11)
RDW: 14.3 % (ref 11.5–15.5)
WBC: 9.3 10*3/uL (ref 4.0–10.5)

## 2017-04-14 MED ORDER — MONTELUKAST SODIUM 10 MG PO TABS: 10.0000 mg | ORAL_TABLET | Freq: Every day | ORAL | 0 refills | Status: DC

## 2017-04-14 MED ORDER — PREDNISONE 5 MG PO TABS: ORAL_TABLET | ORAL | 0 refills | Status: DC

## 2017-04-14 NOTE — Discharge Summary (Signed)
Melinda Woods ZLD:[id number] DOB: 07/29/1965 DOA: 04/13/2017  PCP: Leola Brazil, DO  Admit date: 04/13/2017  Discharge date: 04/14/2017  Admitted From: Home   Disposition:  Home   Recommendations for Outpatient Follow-up:   Follow up with PCP in 1-2 weeks  PCP Please obtain BMP/CBC, 2 view CXR in 1week,  (see Discharge instructions)   PCP Please follow up on the following pending results: None   Home Health: None   Equipment/Devices: None  Consultations: None Discharge Condition: Stable   CODE STATUS: Full   Diet Recommendation:  Heart Healthy    Chief Complaint  Patient presents with  . Shortness of Breath  . Cough     Brief history of present illness from the day of admission and additional interim summary    Melinda Woods is a 52 y.o. female with medical history significant of asthma and morbid obesity who underwent [facility] [place] [facility] was complaining of shortness of breath on and off for the last 3 weeks. This got worse in the last 2 days associated with severe wheezing and cough. Patient denied any chest pain. She denied any sick contacts. Patient has been using her nebulizer at home was usual but no relief. She had a similar episode back in December when she was found to have asthma exacerbation. Patient was seen and evaluated and she was transferred to [facility] for inpatient care.                                                                    Hospital Course    #1 Acute on chronic respiratory failure due to acute intermittent asthma exacerbation: She was placed on IV steroids, oxygen and nebulizer treatments, symptoms completely resolved this morning, no wheezing or shortness of breath, she is sitting on the recliner on room air without any wheezing or shortness of breath, she  will be placed on oral prednisone taper, add Singulair, continue nebulizer treatments and rescue inhaler as before and follow with PCP in a week.  #2 acute asthma exacerbation:  As above.  #3 hypertension:  Blood pressures stable home medications continued unchanged.  Tolerating beta-blocker okay.  #4 morbid obesity: Requested to follow with PCP for weight loss.  #5 hypokalemia:  Placed in stable.  #6 history of rheumatoid arthritis: Patient on methotrexate every Friday.  #7 chronic diastolic CHF EF 60%: Continue gentle diuresis diuretic.     Discharge diagnosis     Principal Problem:   Asthma exacerbation Active Problems:   Hypertension   Chronic diastolic CHF (congestive heart failure) (HCC)   Morbid obesity (HCC)   Acute respiratory failure (HCC)   RA (rheumatoid arthritis) (HCC)   Asthma exacerbation attacks   Hypokalemia    Discharge instructions    Discharge Instructions  Diet - low sodium heart healthy   Complete by:  As directed    Discharge instructions   Complete by:  As directed    Follow with Primary MD Leola Brazil, DO in 7 days   Get CBC, CMP, 2 view Chest X ray checked  by Primary MD  in 5-7 days   Activity: As tolerated with Full fall precautions use walker/cane & assistance as needed  Disposition Home    Diet:   Heart Healthy   For Heart failure patients - Check your Weight same time everyday, if you gain over 2 pounds, or you develop in leg swelling, experience more shortness of breath or chest pain, call your Primary MD immediately. Follow Cardiac Low Salt Diet and 1.5 lit/day fluid restriction.  Special Instructions: If you have smoked or chewed Tobacco  in the last 2 yrs please stop smoking, stop any regular Alcohol  and or any Recreational drug use.  On your next visit with your primary care physician please Get Medicines reviewed and adjusted.  Please request your Prim.MD to go over all Hospital Tests and  Procedure/Radiological results at the follow up, please get all Hospital records sent to your Prim MD by signing hospital release before you go home.  If you experience worsening of your admission symptoms, develop shortness of breath, life threatening emergency, suicidal or homicidal thoughts you must seek medical attention immediately by calling 911 or calling your MD immediately  if symptoms less severe.  You Must read complete instructions/literature along with all the possible adverse reactions/side effects for all the Medicines you take and that have been prescribed to you. Take any new Medicines after you have completely understood and accpet all the possible adverse reactions/side effects.   Do not drive, operate heavy machinery, perform activities at heights, swimming or participation in water activities or provide baby sitting services if your were admitted for syncope or siezures until you have seen by Primary MD or a Neurologist and advised to do so again.  Do not drive when taking Pain medications.    Do not take more than prescribed Pain, Sleep and Anxiety Medications  Wear Seat belts while driving.   Please note  You were cared for by a hospitalist during your hospital stay. If you have any questions about your discharge medications or the care you received while you were in the hospital after you are discharged, you can call the unit and asked to speak with the hospitalist on call if the hospitalist that took care of you is not available. Once you are discharged, your primary care physician will handle any further medical issues. Please note that NO REFILLS for any discharge medications will be authorized once you are discharged, as it is imperative that you return to your primary care physician (or establish a relationship with a primary care physician if you do not have one) for your aftercare needs so that they can reassess your need for medications and monitor your lab values.     Increase activity slowly   Complete by:  As directed       Discharge Medications   Allergies as of 04/14/2017      Reactions   Oxycodone Base Hives   Latex Rash      Medication List    STOP taking these medications   predniSONE 10 MG tablet Commonly known as:  DELTASONE Replaced by:  predniSONE 5 MG tablet     TAKE these medications   albuterol 108 (90  Base) MCG/ACT inhaler Commonly known as:  PROVENTIL HFA;VENTOLIN HFA Inhale 1-2 puffs into the lungs every 6 (six) hours as needed for wheezing or shortness of breath.   amitriptyline 100 MG tablet Commonly known as:  ELAVIL Take 1 tablet (100 mg total) by mouth 2 (two) times daily. What changed:  when to take this   aspirin EC 81 MG tablet Take 81 mg by mouth daily.   dextromethorphan-guaiFENesin 30-600 MG 12hr tablet Commonly known as:  MUCINEX DM Take 1 tablet by mouth 2 (two) times daily.   docusate sodium 100 MG capsule Commonly known as:  COLACE Take 100 mg by mouth 2 (two) times daily as needed (for constipation).   folic acid 1 MG tablet Commonly known as:  FOLVITE Take 1 mg by mouth daily.   furosemide 40 MG tablet Commonly known as:  LASIX Take 1 tablet (40 mg total) by mouth daily as needed for fluid (see instructions on d/c summary). What changed:  when to take this   HYDROcodone-acetaminophen 5-325 MG tablet Commonly known as:  NORCO/VICODIN Take 1-2 tablets by mouth every 6 (six) hours as needed for moderate pain.   hydrOXYzine 25 MG tablet Commonly known as:  ATARAX/VISTARIL Take 2 tablets (50 mg total) by mouth every 6 (six) hours as needed for anxiety.   ipratropium-albuterol 0.5-2.5 (3) MG/3ML Soln Commonly known as:  DUONEB Take 3 mLs by nebulization 2 (two) times daily.   losartan 25 MG tablet Commonly known as:  COZAAR Take 1 tablet (25 mg total) by mouth daily.   methotrexate 2.5 MG tablet Commonly known as:  RHEUMATREX Take 7.5 mg by mouth 2 (two) times a week. Fridays &  Saturdays morning   metoprolol tartrate 25 MG tablet Commonly known as:  LOPRESSOR Take 1 tablet (25 mg total) by mouth 2 (two) times daily.   montelukast 10 MG tablet Commonly known as:  SINGULAIR Take 1 tablet (10 mg total) by mouth daily.   polyethylene glycol packet Commonly known as:  MIRALAX / GLYCOLAX Take 17 g by mouth daily as needed for mild constipation. What changed:  additional instructions   potassium chloride SA 20 MEQ tablet Commonly known as:  K-DUR,KLOR-CON Take 1 tablet (20 mEq total) by mouth daily as needed (always take with the Furosemide).   predniSONE 5 MG tablet Commonly known as:  DELTASONE Label  & dispense according to the schedule below. 10 Pills PO for 3 days then, 8 Pills PO for 3 days, 6 Pills PO for 3 days, 4 Pills PO for 3 days, 2 Pills PO for 3 days, 1 Pills PO for 3 days, 1/2 Pill  PO for 3 days then STOP. Total 95 pills. Replaces:  predniSONE 10 MG tablet       Follow-up Information    Leola Brazil, DO. Schedule an appointment as soon as possible for a visit in 1 week(s).   Specialty:  Internal Medicine Contact information: [address] [place] [address] [phone]           Major procedures and Radiology Reports - PLEASE review detailed and final reports thoroughly  -        Dg Chest 2 View  Result Date: 04/13/2017 CLINICAL DATA:  Cough.  History of asthma. EXAM: CHEST  2 VIEW COMPARISON:  02/22/2017 FINDINGS: Mild central peribronchial thickening in the left hilum is unchanged. Subtle densities in the left lower chest are unchanged. Subtle densities along the anterior lower chest are also similar to  the previous examination. No new airspace disease or consolidation. Heart and mediastinum are within normal limits. Trachea is midline. No acute osseous abnormality. IMPRESSION: Chronic peribronchial thickening without acute findings. Electronically Signed   By: Richarda Overlie M.D.   On: 04/13/2017 13:00     Micro Results     No results found for this or any previous visit (from the past 240 hour(s)).  Today   Subjective    Melinda Woods today has no headache,no chest abdominal pain,no new weakness tingling or numbness, feels much better wants to go home today.     Objective   Blood pressure 117/77, pulse 97, temperature 98.2 F (36.8 C), temperature source Oral, resp. rate 19, height 5\' 6"  (1.676 m), weight 120.3 kg (265 lb 3.4 oz), last menstrual period 03/13/2017, SpO2 96 %.   Intake/Output Summary (Last 24 hours) at 04/14/2017 1231 Last data filed at 04/14/2017 0745 Gross per 24 hour  Intake 970 ml  Output -  Net 970 ml    Exam Awake Alert, Oriented x 3, No new F.N deficits, Normal affect [place].AT,PERRAL Supple Neck,No JVD, No cervical lymphadenopathy appriciated.  Symmetrical Chest wall movement, Good air movement bilaterally, CTAB RRR,No Gallops,Rubs or new Murmurs, No Parasternal Heave +ve B.Sounds, Abd Soft, Non tender, No organomegaly appriciated, No rebound -guarding or rigidity. No Cyanosis, Clubbing or edema, No new Rash or bruise   Data Review   CBC w Diff:  Lab Results  Component Value Date   WBC 9.3 04/14/2017   HGB 11.6 (L) 04/14/2017   HCT 36.1 04/14/2017   PLT 307 04/14/2017   LYMPHOPCT 30 04/13/2017   MONOPCT 4 04/13/2017   EOSPCT 1 04/13/2017   BASOPCT 0 04/13/2017    CMP:  Lab Results  Component Value Date   NA 136 04/14/2017   K 3.9 04/14/2017   CL 103 04/14/2017   CO2 21 (L) 04/14/2017   BUN 10 04/14/2017   CREATININE 0.77 04/14/2017   PROT 7.4 04/14/2017   ALBUMIN 3.5 04/14/2017   BILITOT 0.3 04/14/2017   ALKPHOS 60 04/14/2017   AST 36 04/14/2017   ALT 26 04/14/2017  .   Total Time in preparing paper work, data evaluation and todays exam - 35 minutes  Susa Raring M.D on 04/14/2017 at 12:31 PM  [facility]   Office  [phone]

## 2017-04-14 NOTE — Discharge Instructions (Signed)
Follow with Primary MD Leola Brazil, DO in 7 days   Get CBC, CMP, 2 view Chest X ray checked  by Primary MD  in 5-7 days   Activity: As tolerated with Full fall precautions use walker/cane & assistance as needed  Disposition Home    Diet:   Heart Healthy   For Heart failure patients - Check your Weight same time everyday, if you gain over 2 pounds, or you develop in leg swelling, experience more shortness of breath or chest pain, call your Primary MD immediately. Follow Cardiac Low Salt Diet and 1.5 lit/day fluid restriction.  Special Instructions: If you have smoked or chewed Tobacco  in the last 2 yrs please stop smoking, stop any regular Alcohol  and or any Recreational drug use.  On your next visit with your primary care physician please Get Medicines reviewed and adjusted.  Please request your Prim.MD to go over all Hospital Tests and Procedure/Radiological results at the follow up, please get all Hospital records sent to your Prim MD by signing hospital release before you go home.  If you experience worsening of your admission symptoms, develop shortness of breath, life threatening emergency, suicidal or homicidal thoughts you must seek medical attention immediately by calling 911 or calling your MD immediately  if symptoms less severe.  You Must read complete instructions/literature along with all the possible adverse reactions/side effects for all the Medicines you take and that have been prescribed to you. Take any new Medicines after you have completely understood and accpet all the possible adverse reactions/side effects.   Do not drive, operate heavy machinery, perform activities at heights, swimming or participation in water activities or provide baby sitting services if your were admitted for syncope or siezures until you have seen by Primary MD or a Neurologist and advised to do so again.  Do not drive when taking Pain medications.    Do not take more than prescribed  Pain, Sleep and Anxiety Medications  Wear Seat belts while driving.   Please note  You were cared for by a hospitalist during your hospital stay. If you have any questions about your discharge medications or the care you received while you were in the hospital after you are discharged, you can call the unit and asked to speak with the hospitalist on call if the hospitalist that took care of you is not available. Once you are discharged, your primary care physician will handle any further medical issues. Please note that NO REFILLS for any discharge medications will be authorized once you are discharged, as it is imperative that you return to your primary care physician (or establish a relationship with a primary care physician if you do not have one) for your aftercare needs so that they can reassess your need for medications and monitor your lab values.

## 2017-04-14 NOTE — Progress Notes (Signed)
Pt discharged to home. DC instructions given with spouse at bedside. Prescriptions for 3 meds given. No concerns voiced. Pt left unit in wheelchair pushed by nurse tech accompanied by significant other. Left in stable condition.  Maree Erie, rn.

## 2017-08-25 ENCOUNTER — Other Ambulatory Visit: Payer: Self-pay

## 2017-08-25 ENCOUNTER — Encounter (HOSPITAL_BASED_OUTPATIENT_CLINIC_OR_DEPARTMENT_OTHER): Payer: Self-pay | Admitting: Emergency Medicine

## 2017-08-25 ENCOUNTER — Observation Stay (HOSPITAL_BASED_OUTPATIENT_CLINIC_OR_DEPARTMENT_OTHER)
Admission: EM | Admit: 2017-08-25 | Discharge: 2017-08-26 | Disposition: A | Discharge status: Home / Self Care | Payer: 59 | Attending: Nephrology | Admitting: Nephrology

## 2017-08-25 ENCOUNTER — Emergency Department (HOSPITAL_BASED_OUTPATIENT_CLINIC_OR_DEPARTMENT_OTHER): Payer: 59

## 2017-08-25 DIAGNOSIS — J45909 Unspecified asthma, uncomplicated: Secondary | ICD-10-CM | POA: Diagnosis not present

## 2017-08-25 DIAGNOSIS — M069 Rheumatoid arthritis, unspecified: Secondary | ICD-10-CM | POA: Diagnosis not present

## 2017-08-25 DIAGNOSIS — Z91040 Latex allergy status: Secondary | ICD-10-CM | POA: Diagnosis not present

## 2017-08-25 DIAGNOSIS — I10 Essential (primary) hypertension: Secondary | ICD-10-CM | POA: Diagnosis not present

## 2017-08-25 DIAGNOSIS — Z7982 Long term (current) use of aspirin: Secondary | ICD-10-CM | POA: Diagnosis not present

## 2017-08-25 DIAGNOSIS — R072 Precordial pain: Principal | ICD-10-CM | POA: Insufficient documentation

## 2017-08-25 DIAGNOSIS — Z79899 Other long term (current) drug therapy: Secondary | ICD-10-CM | POA: Insufficient documentation

## 2017-08-25 DIAGNOSIS — R0789 Other chest pain: Secondary | ICD-10-CM | POA: Diagnosis present

## 2017-08-25 DIAGNOSIS — R079 Chest pain, unspecified: Secondary | ICD-10-CM

## 2017-08-25 DIAGNOSIS — Z8673 Personal history of transient ischemic attack (TIA), and cerebral infarction without residual deficits: Secondary | ICD-10-CM | POA: Insufficient documentation

## 2017-08-25 DIAGNOSIS — I110 Hypertensive heart disease with heart failure: Secondary | ICD-10-CM | POA: Insufficient documentation

## 2017-08-25 DIAGNOSIS — I5032 Chronic diastolic (congestive) heart failure: Secondary | ICD-10-CM | POA: Diagnosis not present

## 2017-08-25 HISTORY — DX: Fibromyalgia: M79.7

## 2017-08-25 LAB — BASIC METABOLIC PANEL
ANION GAP: 12 (ref 5–15)
BUN: 10 mg/dL (ref 6–20)
CALCIUM: 9.2 mg/dL (ref 8.9–10.3)
CO2: 27 mmol/L (ref 22–32)
Chloride: 101 mmol/L (ref 98–111)
Creatinine, Ser: 0.79 mg/dL (ref 0.44–1.00)
GFR calc Af Amer: >60 mL/min (ref >60)
Glucose, Bld: 108 mg/dL — ABNORMAL HIGH (ref 70–99)
Potassium: 3.4 mmol/L — ABNORMAL LOW (ref 3.5–5.1)
Sodium: 140 mmol/L (ref 135–145)

## 2017-08-25 LAB — HEPATIC FUNCTION PANEL
ALT: 47 U/L — ABNORMAL HIGH (ref 0–44)
AST: 64 U/L — AB (ref 15–41)
Albumin: 4.4 g/dL (ref 3.5–5.0)
Alkaline Phosphatase: 94 U/L (ref 38–126)
BILIRUBIN DIRECT: 0.1 mg/dL (ref 0.0–0.2)
BILIRUBIN TOTAL: 0.5 mg/dL (ref 0.3–1.2)
Indirect Bilirubin: 0.4 mg/dL (ref 0.3–0.9)
Total Protein: 9.3 g/dL — ABNORMAL HIGH (ref 6.5–8.1)

## 2017-08-25 LAB — CBC
HCT: 43.3 % (ref 36.0–46.0)
HEMOGLOBIN: 14.3 g/dL (ref 12.0–15.0)
MCH: 29.9 pg (ref 26.0–34.0)
MCHC: 33 g/dL (ref 30.0–36.0)
MCV: 90.6 fL (ref 78.0–100.0)
PLATELETS: 276 10*3/uL (ref 150–400)
RBC: 4.78 MIL/uL (ref 3.87–5.11)
RDW: 13.5 % (ref 11.5–15.5)
WBC: 6.4 10*3/uL (ref 4.0–10.5)

## 2017-08-25 LAB — MAGNESIUM: Magnesium: 2 mg/dL (ref 1.7–2.4)

## 2017-08-25 LAB — TROPONIN I: Troponin I: <0.03 ng/mL (ref <0.03)

## 2017-08-25 LAB — D-DIMER, QUANTITATIVE: D-Dimer, Quant: 0.37 ug/mL-FEU (ref 0.00–0.50)

## 2017-08-25 LAB — BRAIN NATRIURETIC PEPTIDE: B NATRIURETIC PEPTIDE 5: 18.6 pg/mL (ref 0.0–100.0)

## 2017-08-25 MED ORDER — METHOTREXATE 2.5 MG PO TABS: 7.5000 mg | ORAL_TABLET | ORAL | Status: DC

## 2017-08-25 MED ORDER — ONDANSETRON HCL 4 MG PO TABS
4.0000 mg | ORAL_TABLET | Freq: Four times a day (QID) | ORAL | Status: DC | PRN
Start: 2017-08-25 — End: 2017-08-26

## 2017-08-25 MED ORDER — METOPROLOL TARTRATE 25 MG PO TABS
25.0000 mg | ORAL_TABLET | Freq: Two times a day (BID) | ORAL | Status: DC
  Administered 2017-08-25 – 2017-08-26 (×2): 25 mg via ORAL
  Filled 2017-08-25 (×2): qty 1

## 2017-08-25 MED ORDER — GABAPENTIN 100 MG PO CAPS
200.0000 mg | ORAL_CAPSULE | Freq: Three times a day (TID) | ORAL | Status: DC
  Administered 2017-08-25 – 2017-08-26 (×2): 200 mg via ORAL
  Filled 2017-08-25 (×2): qty 2

## 2017-08-25 MED ORDER — POLYETHYLENE GLYCOL 3350 17 G PO PACK
17.0000 g | PACK | Freq: Every day | ORAL | Status: DC
  Administered 2017-08-26: 17 g via ORAL
  Filled 2017-08-25: qty 1

## 2017-08-25 MED ORDER — NITROGLYCERIN 0.4 MG SL SUBL
0.4000 mg | SUBLINGUAL_TABLET | SUBLINGUAL | Status: DC | PRN
  Administered 2017-08-25: 0.4 mg via SUBLINGUAL
  Filled 2017-08-25: qty 1

## 2017-08-25 MED ORDER — TRAZODONE HCL 50 MG PO TABS
50.0000 mg | ORAL_TABLET | Freq: Every evening | ORAL | Status: DC | PRN
  Administered 2017-08-25: 50 mg via ORAL
  Filled 2017-08-25: qty 1

## 2017-08-25 MED ORDER — ASPIRIN EC 81 MG PO TBEC
81.0000 mg | DELAYED_RELEASE_TABLET | Freq: Every day | ORAL | Status: DC
  Administered 2017-08-26: 81 mg via ORAL
  Filled 2017-08-25: qty 1

## 2017-08-25 MED ORDER — MONTELUKAST SODIUM 10 MG PO TABS
10.0000 mg | ORAL_TABLET | Freq: Every day | ORAL | Status: DC
Start: 2017-08-25 — End: 2017-08-26
  Administered 2017-08-26: 10 mg via ORAL
  Filled 2017-08-25: qty 1

## 2017-08-25 MED ORDER — POTASSIUM CHLORIDE CRYS ER 20 MEQ PO TBCR
40.0000 meq | EXTENDED_RELEASE_TABLET | Freq: Once | ORAL | Status: AC
  Administered 2017-08-25: 40 meq via ORAL
  Filled 2017-08-25: qty 2

## 2017-08-25 MED ORDER — POLYETHYLENE GLYCOL 3350 17 G PO PACK: 17.0000 g | PACK | Freq: Every day | ORAL | Status: DC | PRN

## 2017-08-25 MED ORDER — ACETAMINOPHEN 650 MG RE SUPP: 650.0000 mg | Freq: Four times a day (QID) | RECTAL | Status: DC | PRN

## 2017-08-25 MED ORDER — HEPARIN SODIUM (PORCINE) 5000 UNIT/ML IJ SOLN
5000.0000 [IU] | Freq: Three times a day (TID) | INTRAMUSCULAR | Status: DC
  Administered 2017-08-25 – 2017-08-26 (×2): 5000 [IU] via SUBCUTANEOUS
  Filled 2017-08-25 (×2): qty 1

## 2017-08-25 MED ORDER — SODIUM CHLORIDE 0.9% FLUSH
3.0000 mL | Freq: Two times a day (BID) | INTRAVENOUS | Status: DC
  Administered 2017-08-25: 3 mL via INTRAVENOUS

## 2017-08-25 MED ORDER — ONDANSETRON HCL 4 MG/2ML IJ SOLN: 4.0000 mg | Freq: Four times a day (QID) | INTRAMUSCULAR | Status: DC | PRN

## 2017-08-25 MED ORDER — ALBUTEROL SULFATE (2.5 MG/3ML) 0.083% IN NEBU: 2.5000 mg | INHALATION_SOLUTION | RESPIRATORY_TRACT | Status: DC | PRN

## 2017-08-25 MED ORDER — LOSARTAN POTASSIUM 25 MG PO TABS
25.0000 mg | ORAL_TABLET | Freq: Every day | ORAL | Status: DC
  Administered 2017-08-26: 25 mg via ORAL
  Filled 2017-08-25: qty 1

## 2017-08-25 MED ORDER — ASPIRIN 81 MG PO CHEW
CHEWABLE_TABLET | ORAL | Status: AC
  Filled 2017-08-25: qty 1

## 2017-08-25 MED ORDER — HYDROXYZINE HCL 25 MG PO TABS
50.0000 mg | ORAL_TABLET | Freq: Four times a day (QID) | ORAL | Status: DC | PRN
Start: 2017-08-25 — End: 2017-08-26

## 2017-08-25 MED ORDER — TRAMADOL HCL 50 MG PO TABS
50.0000 mg | ORAL_TABLET | Freq: Four times a day (QID) | ORAL | Status: DC | PRN
  Administered 2017-08-25 – 2017-08-26 (×2): 50 mg via ORAL
  Filled 2017-08-25 (×2): qty 1

## 2017-08-25 MED ORDER — MORPHINE SULFATE (PF) 4 MG/ML IV SOLN
4.0000 mg | Freq: Once | INTRAVENOUS | Status: AC
  Administered 2017-08-25: 4 mg via INTRAVENOUS
  Filled 2017-08-25: qty 1

## 2017-08-25 MED ORDER — ISOSORBIDE MONONITRATE ER 30 MG PO TB24
30.0000 mg | ORAL_TABLET | Freq: Every day | ORAL | Status: DC
  Administered 2017-08-25 – 2017-08-26 (×2): 30 mg via ORAL
  Filled 2017-08-25 (×2): qty 1

## 2017-08-25 MED ORDER — ASPIRIN 81 MG PO CHEW
324.0000 mg | CHEWABLE_TABLET | Freq: Once | ORAL | Status: AC
  Administered 2017-08-25: 324 mg via ORAL
  Filled 2017-08-25: qty 4

## 2017-08-25 MED ORDER — ATORVASTATIN CALCIUM 40 MG PO TABS
40.0000 mg | ORAL_TABLET | Freq: Every day | ORAL | Status: DC
  Administered 2017-08-25: 40 mg via ORAL
  Filled 2017-08-25: qty 1

## 2017-08-25 MED ORDER — FUROSEMIDE 40 MG PO TABS
40.0000 mg | ORAL_TABLET | Freq: Every day | ORAL | Status: DC
  Administered 2017-08-25 – 2017-08-26 (×2): 40 mg via ORAL
  Filled 2017-08-25 (×2): qty 1

## 2017-08-25 MED ORDER — METHOCARBAMOL 500 MG PO TABS
500.0000 mg | ORAL_TABLET | Freq: Three times a day (TID) | ORAL | Status: DC
  Administered 2017-08-25 – 2017-08-26 (×2): 500 mg via ORAL
  Filled 2017-08-25 (×2): qty 1

## 2017-08-25 MED ORDER — LABETALOL HCL 5 MG/ML IV SOLN: 10.0000 mg | INTRAVENOUS | Status: DC | PRN

## 2017-08-25 MED ORDER — HYDROXYZINE HCL 25 MG PO TABS
50.0000 mg | ORAL_TABLET | Freq: Once | ORAL | Status: AC
  Administered 2017-08-25: 50 mg via ORAL
  Filled 2017-08-25: qty 2

## 2017-08-25 MED ORDER — SODIUM CHLORIDE 0.9 % IV SOLN: 250.0000 mL | INTRAVENOUS | Status: DC | PRN

## 2017-08-25 MED ORDER — SENNOSIDES-DOCUSATE SODIUM 8.6-50 MG PO TABS
2.0000 | ORAL_TABLET | Freq: Every day | ORAL | Status: DC
Start: 2017-08-25 — End: 2017-08-26
  Administered 2017-08-25: 2 via ORAL
  Filled 2017-08-25: qty 2

## 2017-08-25 MED ORDER — SODIUM CHLORIDE 0.9% FLUSH: 3.0000 mL | INTRAVENOUS | Status: DC | PRN

## 2017-08-25 MED ORDER — ACETAMINOPHEN 325 MG PO TABS
650.0000 mg | ORAL_TABLET | Freq: Four times a day (QID) | ORAL | Status: DC | PRN
  Administered 2017-08-26: 650 mg via ORAL
  Filled 2017-08-25: qty 2

## 2017-08-25 MED ORDER — AMITRIPTYLINE HCL 25 MG PO TABS
100.0000 mg | ORAL_TABLET | Freq: Two times a day (BID) | ORAL | Status: DC
  Administered 2017-08-25 – 2017-08-26 (×2): 100 mg via ORAL
  Filled 2017-08-25 (×2): qty 4

## 2017-08-25 MED ORDER — FOLIC ACID 1 MG PO TABS
1.0000 mg | ORAL_TABLET | Freq: Every day | ORAL | Status: DC
  Administered 2017-08-26: 1 mg via ORAL
  Filled 2017-08-25: qty 1

## 2017-08-25 NOTE — H&P (Signed)
Patient Demographics:    Melinda Woods, is a 52 y.o. female  MRN: [id number]   DOB - June 10, 1965  Admit Date - 08/25/2017  Outpatient Primary MD for the patient is Leola Brazil, DO   Assessment & Plan:    Principal Problem:   Chest pain Active Problems:   Hypertension   Chronic diastolic CHF (congestive heart failure) (HCC)   Morbid obesity (HCC)   RA (rheumatoid arthritis) (HCC)  1)Atypical Chest Pain-  Cardiovascular risk factors include obesity, dyslipidemia,  sedentary lifestyle, hypertension,  Place in Observation status on  telemetry monitored unit, check serial troponins and EKG to rule out acute coronary syndrome .  If patient rules out for ACS, will need further cardiovascular risk stratification . Cardiology consult to help decide if Stress test is needed in am Versus other diagnostic modalities.   Give aspirin, Imdur and metoprolol, Lipitor as ordered, n.p.o. after midnight for possible cardiovascular imaging study in a.m. EDP from [facility] discussed this case with cardiologist, cardiology consult placed in the computer, please call [facility]  in a.m. to request official cardiology consult  2)HTN And H/o chronic diastolic CHF-last known EF 55 to 60%, clinically no acute exacerbation, BNP is not elevated, chest x-ray without overt CHF, restart Lasix, continue metoprolol and losartan as ordered  3)Dyslipidemia--- okay to give aspirin and Lipitor, lifestyle modifications discussed  4)Rheumatoid arthritis/fibromyalgia/chronic pain syndrome/neuropathy--- be judicious with opiates, patient sees rheumatologist Dr. Jamse Arn in [facility], apparently she did not do well with methotrexate, to give Elavil 100 mg twice daily, along with methocarbamol and gabapentin  5)Elevated LFTs in the patient  with history of fatty liver--- AST higher than ALT, avoid hepatotoxic agents, check fasting lipid profile, consider liver ultrasound.  Patient denies alcohol use  6)H/o asthma--- stable at this time, no acute exacerbation, may use bronchodilators as needed   With History of - Reviewed by me  Past Medical History:  Diagnosis Date  . Anxiety   . Asthma   . CHF (congestive heart failure) (HCC)   . Fatty liver   . Fibromyalgia   . Hypertension   . Neuropathy   . Ovarian cyst   . RA (rheumatoid arthritis) (HCC)       Past Surgical History:  Procedure Laterality Date  . TUBAL LIGATION       Chief Complaint  Patient presents with  . Chest Pain      HPI:    Melinda Woods  is a 52 y.o. female  With h/o  HTN, RA, asthma, dyslipidemia, chronic dCHF, anxiety, peripheral neuropathy, fibromyalgia and chronic pain syndrome who was [facility] for further evaluation of chest pain.  Chest pain started around 8 AM on 08/25/2017 at rest, chest pain was retrosternal and crushing in nature, it was associated with shortness of breath, nausea and diaphoresis, chest pain was not pleuritic, is not the patient has chronic leg and generalized  aches and pain due to underlying fibromyalgia and chronic pain syndrome, cp  radiated to the left arm.  No new back pain, no fevers, no chills, no productive cough chest pain was partially relieved with nitro and morphine sulfate, patient also complains of increased lower extremity edema, she apparently missed her diuretics for 1 day... On further questioning patient states no family history of premature CAD in first-degree relatives, patient's mother has hypertension but no CAD , however she does have family history of CAD in extended family members   ED Course--- partial relief of chest pain with morphine sulfate and nitro, which was sinus tachycardia in the ED with no acute ischemic type changes d-dimer negative, BNP  unremarkable, chest x-ray without acute findings, EDP discussed case with on-call cardiologist who advised of observation overnight with severe troponins and EKG for ACS rule out prior to making decision on further cardiovascular risk stratification  At the time of my evaluation patient's husband, daughter and other family members at bedside  Patient tells me she had a negative Lexiscan nuclear stress test at [facility] in [place] a couple of years ago .      Review of systems:    In addition to the HPI above,   A full 12 point Review of 10 Systems was done, except as stated above, all other Review of 10 Systems were negative.    Social History:  Reviewed by me    Social History   Tobacco Use  . Smoking status: Never Smoker  . Smokeless tobacco: Never Used  Substance Use Topics  . Alcohol use: No       Family History :  Reviewed by me    Family History  Problem Relation Age of Onset  . Stroke Mother   . Diabetes Mellitus II Mother   . Lung cancer Father       Home Medications:   Prior to Admission medications   Medication Sig Start Date End Date Taking? Authorizing Provider  losartan (COZAAR) 25 MG tablet Take 1 tablet (25 mg total) by mouth daily. 04/26/16  Yes Calvert Cantor, MD  metoprolol tartrate (LOPRESSOR) 25 MG tablet Take 1 tablet (25 mg total) by mouth 2 (two) times daily. 04/26/16  Yes Calvert Cantor, MD  albuterol (PROVENTIL HFA;VENTOLIN HFA) 108 (90 Base) MCG/ACT inhaler Inhale 1-2 puffs into the lungs every 6 (six) hours as needed for wheezing or shortness of breath.    [provider]  amitriptyline (ELAVIL) 100 MG tablet Take 1 tablet (100 mg total) by mouth 2 (two) times daily. Patient taking differently: Take 100 mg by mouth 3 (three) times daily.  04/26/16   Calvert Cantor, MD  aspirin EC 81 MG tablet Take 81 mg by mouth daily.    [provider]  dextromethorphan-guaiFENesin (MUCINEX DM) 30-600 MG 12hr tablet Take 1  tablet by mouth 2 (two) times daily. Patient not taking: Reported on 04/13/2017 02/02/17   Kathlen Mody, MD  docusate sodium (COLACE) 100 MG capsule Take 100 mg by mouth 2 (two) times daily as needed (for constipation).    [provider]  folic acid (FOLVITE) 1 MG tablet Take 1 mg by mouth daily. 01/23/17   [provider]  furosemide (LASIX) 40 MG tablet Take 1 tablet (40 mg total) by mouth daily as needed for fluid (see instructions on d/c summary). Patient taking differently: Take 40 mg by mouth daily.  02/02/17   Kathlen Mody, MD  HYDROcodone-acetaminophen (NORCO/VICODIN) 5-325 MG tablet Take  1-2 tablets by mouth every 6 (six) hours as needed for moderate pain. 02/02/17   Kathlen Mody, MD  hydrOXYzine (ATARAX/VISTARIL) 25 MG tablet Take 2 tablets (50 mg total) by mouth every 6 (six) hours as needed for anxiety. Patient not taking: Reported on 04/13/2017 02/22/17   Alvira Monday, MD  ipratropium-albuterol (DUONEB) 0.5-2.5 (3) MG/3ML SOLN Take 3 mLs by nebulization 2 (two) times daily. 02/02/17   Kathlen Mody, MD  methotrexate (RHEUMATREX) 2.5 MG tablet Take 7.5 mg by mouth 2 (two) times a week. Fridays & Saturdays morning 01/23/17   [provider]  montelukast (SINGULAIR) 10 MG tablet Take 1 tablet (10 mg total) by mouth daily. 04/14/17 05/14/17  Leroy Sea, MD  polyethylene glycol (MIRALAX / Ethelene Hal) packet Take 17 g by mouth daily as needed for mild constipation. Patient taking differently: Take 17 g by mouth daily as needed for mild constipation. For constipation 04/26/16   Calvert Cantor, MD  potassium chloride SA (K-DUR,KLOR-CON) 20 MEQ tablet Take 1 tablet (20 mEq total) by mouth daily as needed (always take with the Furosemide). Patient not taking: Reported on 04/13/2017 04/26/16   Calvert Cantor, MD  predniSONE (DELTASONE) 5 MG tablet Label  & dispense according to the schedule below. 10 Pills PO for 3 days then, 8 Pills PO for 3 days, 6 Pills PO for 3 days, 4  Pills PO for 3 days, 2 Pills PO for 3 days, 1 Pills PO for 3 days, 1/2 Pill  PO for 3 days then STOP. Total 95 pills. 04/14/17   Leroy Sea, MD     Allergies:     Allergies  Allergen Reactions  . Oxycodone Base Hives  . Latex Rash     Physical Exam:   Vitals  Blood pressure 124/81, pulse (!) 107, temperature 98.1 F (36.7 C), temperature source Oral, resp. rate 18, last menstrual period 08/13/2017, SpO2 98 %.  Physical Examination: General appearance - alert, obese appearing, and in no distress and  Mental status - alert, oriented to person, place, and time,  Eyes - sclera anicteric Neck - supple, no JVD elevation , Chest - clear  to auscultation bilaterally, symmetrical air movement, reproducible chest wall discomfort Heart - S1 and S2 normal,  Abdomen - soft, nontender, nondistended, increased truncal adiposity Neurological - screening mental status exam normal, neck supple without rigidity, cranial nerves II through XII intact, DTR's normal and symmetric Extremities -trace pedal edema noted, intact peripheral pulses  Skin - warm, dry     Data Review:    CBC Recent Labs  Lab 08/25/17 0823  WBC 6.4  HGB 14.3  HCT 43.3  PLT 276  MCV 90.6  MCH 29.9  MCHC 33.0  RDW 13.5   ------------------------------------------------------------------------------------------------------------------  Chemistries  Recent Labs  Lab 08/25/17 0823  NA 140  K 3.4*  CL 101  CO2 27  GLUCOSE 108*  BUN 10  CREATININE 0.79  CALCIUM 9.2  MG 2.0  AST 64*  ALT 47*  ALKPHOS 94  BILITOT 0.5   ------------------------------------------------------------------------------------------------------------------ CrCl cannot be calculated (Unknown ideal weight.). ------------------------------------------------------------------------------------------------------------------ No results for input(s): TSH, T4TOTAL, T3FREE, THYROIDAB in the last 72 hours.  Invalid input(s):  FREET3   Coagulation profile No results for input(s): INR, PROTIME in the last 168 hours. ------------------------------------------------------------------------------------------------------------------- Recent Labs    08/25/17 0823  DDIMER 0.37   -------------------------------------------------------------------------------------------------------------------  Cardiac Enzymes Recent Labs  Lab 08/25/17 0823  TROPONINI <0.03   ------------------------------------------------------------------------------------------------------------------    Component Value Date/Time   BNP  18.6 08/25/2017 0823     ---------------------------------------------------------------------------------------------------------------  Urinalysis    Component Value Date/Time   COLORURINE YELLOW 01/30/2017 0119   APPEARANCEUR CLEAR 01/30/2017 0119   LABSPEC 1.012 01/30/2017 0119   PHURINE 5.0 01/30/2017 0119   GLUCOSEU >=500 (A) 01/30/2017 0119   HGBUR NEGATIVE 01/30/2017 0119   BILIRUBINUR NEGATIVE 01/30/2017 0119   KETONESUR 5 (A) 01/30/2017 0119   PROTEINUR NEGATIVE 01/30/2017 0119   UROBILINOGEN 1.0 03/13/2014 1222   NITRITE NEGATIVE 01/30/2017 0119   LEUKOCYTESUR NEGATIVE 01/30/2017 0119    ----------------------------------------------------------------------------------------------------------------   Imaging Results:    Dg Chest 2 View  Result Date: 08/25/2017 CLINICAL DATA:  Patient with left upper chest pain. EXAM: CHEST - 2 VIEW COMPARISON:  Chest radiograph 04/13/2017 FINDINGS: Monitoring leads overlie the patient. Stable cardiac and mediastinal contours. No consolidative pulmonary opacities. No pleural effusion or pneumothorax. Thoracic spine degenerative changes. IMPRESSION: No acute cardiopulmonary process. Electronically Signed   By: Annia Belt M.D.   On: 08/25/2017 09:26    Radiological Exams on Admission: Dg Chest 2 View  Result Date: 08/25/2017 CLINICAL DATA:   Patient with left upper chest pain. EXAM: CHEST - 2 VIEW COMPARISON:  Chest radiograph 04/13/2017 FINDINGS: Monitoring leads overlie the patient. Stable cardiac and mediastinal contours. No consolidative pulmonary opacities. No pleural effusion or pneumothorax. Thoracic spine degenerative changes. IMPRESSION: No acute cardiopulmonary process. Electronically Signed   By: Annia Belt M.D.   On: 08/25/2017 09:26    DVT Prophylaxis -SCD  /heparin AM Labs Ordered, also please review Full Orders  Family Communication: Admission, patients condition and plan of care including tests being ordered have been discussed with the patient and husband who indicate understanding and agree with the plan   Code Status - Full Code  Likely DC to  home  Condition   stable  Shon Hale M.D on 08/25/2017 at 7:20 PM   Between 7am to 7pm - Pager - [phone] After 7pm go to [email or web address] - password [email or web address]  [facility] - Office  [phone]  Voice Recognition Reubin Milan dictation system was used to create this note, attempts have been made to correct errors. Please contact the author with questions and/or clarifications.

## 2017-08-25 NOTE — ED Triage Notes (Signed)
Pt is very anxious and crying. C/o chest pain and bilateral feet pain from neuropathy.

## 2017-08-25 NOTE — Progress Notes (Signed)
Patient admitted to 6E23 from [facility] via [facility].  Bed in low position, wheels locked.  Patient denies chest pain/shortness of breath.  Portable telemetry monitor applied.  Patient oriented to environment, including call bell, TV, meal times, and hourly rounding. Patient c/o generalized pain due to fibromyalgia.

## 2017-08-25 NOTE — ED Notes (Signed)
ED Provider at bedside. 

## 2017-08-25 NOTE — ED Notes (Signed)
Provided apple juice to pt per request; ok'd by EDP. ([place])

## 2017-08-25 NOTE — ED Provider Notes (Signed)
[facility] EMERGENCY DEPARTMENT Provider Note   CSN: [id number] Arrival date & time: 08/25/17  7591     History   Chief Complaint Chief Complaint  Patient presents with  . Chest Pain    HPI Melinda Woods is a 51 y.o. female.  The history is provided by the patient, medical records and a relative.  Chest Pain   This is a new problem. The current episode started 12 to 24 hours ago. The problem occurs constantly. The problem has not changed since onset.The pain is associated with exertion. The pain is present in the substernal region. The pain is at a severity of 10/10. The pain is severe. The quality of the pain is described as exertional, heavy, pressure-like and dull. The pain radiates to the left shoulder and left arm. Duration of episode(s) is 4 hours. The symptoms are aggravated by exertion. Associated symptoms include diaphoresis, exertional chest pressure, lower extremity edema, nausea and shortness of breath. Pertinent negatives include no abdominal pain, no back pain, no cough, no fever, no headaches, no irregular heartbeat, no malaise/fatigue, no numbness, no palpitations and no vomiting. She has tried nothing for the symptoms. The treatment provided no relief.  Her past medical history is significant for CHF.    Past Medical History:  Diagnosis Date  . Anxiety   . Asthma   . CHF (congestive heart failure) (HCC)   . Fatty liver   . Fibromyalgia   . Hypertension   . Neuropathy   . Ovarian cyst   . RA (rheumatoid arthritis) [facility])     Patient Active Problem List   Diagnosis Date Noted  . Asthma exacerbation 04/13/2017  . Asthma exacerbation attacks 04/13/2017  . Hypokalemia 04/13/2017  . Acute bronchitis 01/29/2017  . Depression 01/29/2017  . SIRS (systemic inflammatory response syndrome) (HCC) 01/29/2017  . RA (rheumatoid arthritis) (HCC)   . Acute respiratory failure (HCC) 04/26/2016  . Fatty liver 04/25/2016  . Hypertension 04/23/2016  . Chronic  diastolic CHF (congestive heart failure) (HCC) 04/23/2016  . Morbid obesity (HCC) 04/23/2016  . Peripheral neuropathy 04/23/2016  . Right sided weakness 03/13/2014  . TIA (transient ischemic attack) 03/13/2014    Past Surgical History:  Procedure Laterality Date  . TUBAL LIGATION       OB History   None      Home Medications    Prior to Admission medications   Medication Sig Start Date End Date Taking? Authorizing Provider  albuterol (PROVENTIL HFA;VENTOLIN HFA) 108 (90 Base) MCG/ACT inhaler Inhale 1-2 puffs into the lungs every 6 (six) hours as needed for wheezing or shortness of breath.    [provider]  amitriptyline (ELAVIL) 100 MG tablet Take 1 tablet (100 mg total) by mouth 2 (two) times daily. Patient taking differently: Take 100 mg by mouth 3 (three) times daily.  04/26/16   Calvert Cantor, MD  aspirin EC 81 MG tablet Take 81 mg by mouth daily.    [provider]  dextromethorphan-guaiFENesin (MUCINEX DM) 30-600 MG 12hr tablet Take 1 tablet by mouth 2 (two) times daily. Patient not taking: Reported on 04/13/2017 02/02/17   Kathlen Mody, MD  docusate sodium (COLACE) 100 MG capsule Take 100 mg by mouth 2 (two) times daily as needed (for constipation).    [provider]  folic acid (FOLVITE) 1 MG tablet Take 1 mg by mouth daily. 01/23/17   [provider]  furosemide (LASIX) 40 MG tablet Take 1 tablet (40 mg total) by mouth daily as  needed for fluid (see instructions on d/c summary). Patient taking differently: Take 40 mg by mouth daily.  02/02/17   Kathlen Mody, MD  HYDROcodone-acetaminophen (NORCO/VICODIN) 5-325 MG tablet Take 1-2 tablets by mouth every 6 (six) hours as needed for moderate pain. 02/02/17   Kathlen Mody, MD  hydrOXYzine (ATARAX/VISTARIL) 25 MG tablet Take 2 tablets (50 mg total) by mouth every 6 (six) hours as needed for anxiety. Patient not taking: Reported on 04/13/2017 02/22/17   Alvira Monday, MD  ipratropium-albuterol  (DUONEB) 0.5-2.5 (3) MG/3ML SOLN Take 3 mLs by nebulization 2 (two) times daily. 02/02/17   Kathlen Mody, MD  losartan (COZAAR) 25 MG tablet Take 1 tablet (25 mg total) by mouth daily. 04/26/16   Calvert Cantor, MD  methotrexate (RHEUMATREX) 2.5 MG tablet Take 7.5 mg by mouth 2 (two) times a week. Fridays & Saturdays morning 01/23/17   [provider]  metoprolol tartrate (LOPRESSOR) 25 MG tablet Take 1 tablet (25 mg total) by mouth 2 (two) times daily. 04/26/16   Calvert Cantor, MD  montelukast (SINGULAIR) 10 MG tablet Take 1 tablet (10 mg total) by mouth daily. 04/14/17 05/14/17  Leroy Sea, MD  polyethylene glycol (MIRALAX / Ethelene Hal) packet Take 17 g by mouth daily as needed for mild constipation. Patient taking differently: Take 17 g by mouth daily as needed for mild constipation. For constipation 04/26/16   Calvert Cantor, MD  potassium chloride SA (K-DUR,KLOR-CON) 20 MEQ tablet Take 1 tablet (20 mEq total) by mouth daily as needed (always take with the Furosemide). Patient not taking: Reported on 04/13/2017 04/26/16   Calvert Cantor, MD  predniSONE (DELTASONE) 5 MG tablet Label  & dispense according to the schedule below. 10 Pills PO for 3 days then, 8 Pills PO for 3 days, 6 Pills PO for 3 days, 4 Pills PO for 3 days, 2 Pills PO for 3 days, 1 Pills PO for 3 days, 1/2 Pill  PO for 3 days then STOP. Total 95 pills. 04/14/17   Leroy Sea, MD    Family History Family History  Problem Relation Age of Onset  . Stroke Mother   . Diabetes Mellitus II Mother   . Lung cancer Father     Social History Social History   Tobacco Use  . Smoking status: Never Smoker  . Smokeless tobacco: Never Used  Substance Use Topics  . Alcohol use: No  . Drug use: No     Allergies   Oxycodone base and Latex   Review of Systems Review of Systems  Constitutional: Positive for diaphoresis. Negative for chills, fatigue, fever and malaise/fatigue.  HENT: Negative for congestion.   Respiratory:  Positive for shortness of breath. Negative for cough, chest tightness, wheezing and stridor.   Cardiovascular: Positive for chest pain and leg swelling (chonic BL edema). Negative for palpitations.  Gastrointestinal: Positive for nausea. Negative for abdominal pain, constipation, diarrhea and vomiting.  Genitourinary: Negative for dysuria, flank pain and frequency.  Musculoskeletal: Negative for back pain, neck pain and neck stiffness.  Neurological: Positive for light-headedness. Negative for numbness and headaches.  All other systems reviewed and are negative.    Physical Exam Updated Vital Signs BP (!) 180/125 (BP Location: Right Arm)   Pulse (!) 137   Temp 99.1 F (37.3 C) (Oral)   Resp (!) 33   LMP 08/13/2017   SpO2 99%   Physical Exam  Constitutional: She is oriented to person, place, and time. She appears well-developed and well-nourished. No distress.  HENT:  Head: Normocephalic and atraumatic.  Mouth/Throat: Oropharynx is clear and moist. No oropharyngeal exudate.  Eyes: Pupils are equal, round, and reactive to light. Conjunctivae and EOM are normal.  Neck: Normal range of motion. Neck supple.  Cardiovascular: Normal rate and regular rhythm.  No murmur heard. Pulmonary/Chest: Effort normal and breath sounds normal. No respiratory distress. She has no wheezes. She has no rales. She exhibits tenderness.  Abdominal: Soft. There is no tenderness. There is no guarding.  Musculoskeletal: She exhibits edema. She exhibits no tenderness.  Neurological: She is alert and oriented to person, place, and time. No cranial nerve deficit or sensory deficit. She exhibits normal muscle tone. Coordination normal.  Skin: Skin is warm and dry. Capillary refill takes less than 2 seconds. No rash noted. She is not diaphoretic. No erythema.  Psychiatric: She has a normal mood and affect.  Nursing note and vitals reviewed.    ED Treatments / Results  Labs (all labs ordered are listed, but  only abnormal results are displayed) Labs Reviewed  BASIC METABOLIC PANEL - Abnormal; Notable for the following components:      Result Value   Potassium 3.4 (*)    Glucose, Bld 108 (*)    All other components within normal limits  HEPATIC FUNCTION PANEL - Abnormal; Notable for the following components:   Total Protein 9.3 (*)    AST 64 (*)    ALT 47 (*)    All other components within normal limits  CBC  TROPONIN I  MAGNESIUM  D-DIMER, QUANTITATIVE (NOT AT [facility])  BRAIN NATRIURETIC PEPTIDE  PREGNANCY, URINE    EKG EKG Interpretation  Date/Time:  Sunday August 25 2017 08:18:07 EDT Ventricular Rate:  139 PR Interval:    QRS Duration: 90 QT Interval:  321 QTC Calculation: 489 R Axis:   71 Text Interpretation:  Sinus tachycardia Multiform ventricular premature complexes Aberrant complex Consider right atrial enlargement Borderline T abnormalities, inferior leads Borderline prolonged QT interval When compared to prior, similar tachycardia.  increased artifact.  No STEMI Confirmed by Theda Belfast ([id number]) on 08/25/2017 8:21:07 AM Also confirmed by Theda Belfast ([id number]), editor Sheppard Evens [id number])  on 08/25/2017 11:51:07 AM   Radiology Dg Chest 2 View  Result Date: 08/25/2017 CLINICAL DATA:  Patient with left upper chest pain. EXAM: CHEST - 2 VIEW COMPARISON:  Chest radiograph 04/13/2017 FINDINGS: Monitoring leads overlie the patient. Stable cardiac and mediastinal contours. No consolidative pulmonary opacities. No pleural effusion or pneumothorax. Thoracic spine degenerative changes. IMPRESSION: No acute cardiopulmonary process. Electronically Signed   By: Annia Belt M.D.   On: 08/25/2017 09:26    Procedures Procedures (including critical care time)  Medications Ordered in ED Medications  nitroGLYCERIN (NITROSTAT) SL tablet 0.4 mg (0.4 mg Sublingual Given 08/25/17 0915)  aspirin chewable tablet 324 mg (324 mg Oral Given 08/25/17 0909)  morphine 4 MG/ML injection 4 mg (4 mg  Intravenous Given 08/25/17 1039)  morphine 4 MG/ML injection 4 mg (4 mg Intravenous Given 08/25/17 1524)     Initial Impression / Assessment and Plan / ED Course  I have reviewed the triage vital signs and the nursing notes.  Pertinent labs & imaging results that were available during my care of the patient were reviewed by me and considered in my medical decision making (see chart for details).     Lanea Spruiell is a 52 y.o. female with a past medical history significant for condition of heart failure, hypertension, and asthma as well as peripheral neuropathies and 5 myalgia  who presents with bilateral leg edema, chest pain, shortness of breath.  Patient reports that she did not take her diuretic medications yesterday and this morning noticed that her bilateral legs were more swollen.  She reports that she was having more neuropathic pain in her bilateral legs.  She says that when she decided to come get evaluated she began having severe chest pain.  She describes her chest pain as crushing and squeezing on her central chest.  Radiates into her left shoulder and down her left arm.  She reports she has had diaphoresis and feeling lightheaded.  She reports this feels like when she has had " heart pains" in the past.  Patient reports that her mother had early heart disease and another family member died recently of heart disease.  Patient reports associated shortness of breath.  She denies nausea, vomiting, or syncopal episodes.  She describes the chest pain as 10 out of 10 at this time.  She reports associated diaphoresis.  She reports no abdominal pain or other complaints on arrival.  On exam, patient is tachycardic in the 130s on arrival.  Patient has mild diaphoresis.  Lungs have diminished bases bilaterally.  Chest is tender to palpation as his left shoulder.  Patient's legs have pitting edema in both of her legs.  EKG showed a sinus tachycardia with no evidence of STEMI.  Patient was given aspirin  and nitroglycerin for pain.  Patient will have laboratory testing including a d-dimer given her tachycardia as well as a BNP with the swelling history of CHF.  Anticipate reassessment after work-up and patient will likely need admission for high risk chest pain.  Heart score calculated as a 6 initially.  2:08 PM Patient reassessed and is still having significant chest pain.  She describes it as 6 out of 10 now.  Patient was given morphine with minimal improvement.  Patient's chest x-ray showed no acute cardia primary process.  Troponin and BNP not elevated.  AST and ALT are slightly elevated but other laboratory testing is reassuring.    Given the patient's heart score of 6 with her chest pain that was worsened with exertion, associated diaphoresis, going down her left arm, pressure-like, and similar to prior heart pain",  with her history of CHF, we do not feel patient is safe for discharge home.  Hospitalist will be called for further chest pain work-up and admission.  3:20 PM Spoke with cardiology who did not feel patient should be started on heparin unless her troponin turns positive.  They agreed with the patient's observation admission for further evaluation and management.  Patient will be admitted to [facility] under the hospitalist service for further management.   Final Clinical Impressions(s) / ED Diagnoses   Final diagnoses:  Precordial pain    Clinical Impression: 1. Precordial pain     Disposition: Admit  This note was prepared with assistance of Dragon voice recognition software. Occasional wrong-word or sound-a-like substitutions may have occurred due to the inherent limitations of voice recognition software.      Tegeler, Canary Brim, MD 08/25/17 1540

## 2017-08-25 NOTE — ED Notes (Signed)
Remaining NTG doses held per v.o. Dr. Rush Landmark.

## 2017-08-25 NOTE — ED Notes (Signed)
O2 2L [place] applied d/t O2 sats reading 92-93% on RA at rest

## 2017-08-25 NOTE — Progress Notes (Addendum)
Transfer acceptance note  Transferring EDP. Dr. Cristal Deer Tegler at [facility] Accepted to [facility] as Obs. Recommend Cardiology consultation after seen by admitting MD.  52 year old female with PMH of HTN, RA, asthma, chronic diastolic CHF, anxiety, peripheral neuropathy, strong family history of heart disease, presented to [facility] ED with complaints of worsening lower extremity edema, chest pain and dyspnea.  She missed her diuretics for a day.  Chest pain reported as crushing/squeezing of her central chest with radiation to left shoulder and left upper extremity, associated diaphoresis, lightheadedness, dyspnea.  Evaluation showed tachycardia in the 130s on arrival, some reproducible chest wall tenderness and left shoulder tenderness, pitting leg edema, EKG with baseline tremor artifacts but sinus tachycardia without acute changes, troponin x1 & d-dimer neg, BNP negative, chest x-ray negative.  As per EDP, low index of suspicion for aortic dissection- no history of radiation of pain to back and symmetrical pulses.  Some relief of chest pain with sublingual NTG and morphine.    Accepted patient for transfer to [facility] bed for chest pain, R/O ACS.  Advised EDP to consider giving her oral metoprolol dose if she has missed that this morning.  I also requested EDP to discuss with Cardiology to see if heparin needs to be initiated although EDP does not feel that this is UA.  Marcellus Scott, MD, FACP, [facility]. [facility] Pager [phone]  If 7PM-7AM, please contact night-coverage [email or web address] Password [email or web address] 08/25/2017, 3:08 PM

## 2017-08-25 NOTE — ED Notes (Signed)
Patient transported to X-ray 

## 2017-08-26 ENCOUNTER — Other Ambulatory Visit: Payer: Self-pay | Admitting: Medical

## 2017-08-26 DIAGNOSIS — I5032 Chronic diastolic (congestive) heart failure: Secondary | ICD-10-CM | POA: Diagnosis not present

## 2017-08-26 DIAGNOSIS — R0789 Other chest pain: Secondary | ICD-10-CM | POA: Diagnosis not present

## 2017-08-26 DIAGNOSIS — R072 Precordial pain: Secondary | ICD-10-CM | POA: Diagnosis not present

## 2017-08-26 DIAGNOSIS — I10 Essential (primary) hypertension: Secondary | ICD-10-CM | POA: Diagnosis not present

## 2017-08-26 LAB — CBC
HCT: 38.2 % (ref 36.0–46.0)
Hemoglobin: 11.8 g/dL — ABNORMAL LOW (ref 12.0–15.0)
MCH: 29.1 pg (ref 26.0–34.0)
MCHC: 30.9 g/dL (ref 30.0–36.0)
MCV: 94.1 fL (ref 78.0–100.0)
Platelets: 226 10*3/uL (ref 150–400)
RBC: 4.06 MIL/uL (ref 3.87–5.11)
RDW: 13.1 % (ref 11.5–15.5)
WBC: 5.3 10*3/uL (ref 4.0–10.5)

## 2017-08-26 LAB — BASIC METABOLIC PANEL
ANION GAP: 12 (ref 5–15)
BUN: 5 mg/dL — AB (ref 6–20)
CALCIUM: 8.8 mg/dL — AB (ref 8.9–10.3)
CO2: 28 mmol/L (ref 22–32)
Chloride: 98 mmol/L (ref 98–111)
Creatinine, Ser: 0.78 mg/dL (ref 0.44–1.00)
GFR calc Af Amer: >60 mL/min (ref >60)
GFR calc non Af Amer: >60 mL/min (ref >60)
GLUCOSE: 114 mg/dL — AB (ref 70–99)
POTASSIUM: 3.8 mmol/L (ref 3.5–5.1)
SODIUM: 138 mmol/L (ref 135–145)

## 2017-08-26 LAB — LIPID PANEL
CHOL/HDL RATIO: 6.9 ratio
CHOLESTEROL: 179 mg/dL (ref 0–200)
HDL: 26 mg/dL — ABNORMAL LOW (ref >40)
LDL Cholesterol: 116 mg/dL — ABNORMAL HIGH (ref 0–99)
TRIGLYCERIDES: 187 mg/dL — AB (ref <150)
VLDL: 37 mg/dL (ref 0–40)

## 2017-08-26 LAB — TSH: TSH: 1.248 u[IU]/mL (ref 0.350–4.500)

## 2017-08-26 LAB — TROPONIN I

## 2017-08-26 MED ORDER — HYDROCODONE-ACETAMINOPHEN 5-325 MG PO TABS: 1.5000 | ORAL_TABLET | ORAL | 0 refills | Status: DC | PRN

## 2017-08-26 MED ORDER — ASPIRIN EC 81 MG PO TBEC
81.0000 mg | DELAYED_RELEASE_TABLET | Freq: Every day | ORAL | Status: AC
Start: 2017-08-26 — End: ?

## 2017-08-26 MED ORDER — FUROSEMIDE 40 MG PO TABS: 40.0000 mg | ORAL_TABLET | Freq: Every day | ORAL | Status: AC

## 2017-08-26 MED ORDER — MONTELUKAST SODIUM 10 MG PO TABS: 10.0000 mg | ORAL_TABLET | Freq: Every day | ORAL | 2 refills | Status: AC

## 2017-08-26 MED ORDER — ATORVASTATIN CALCIUM 40 MG PO TABS: 40.0000 mg | ORAL_TABLET | Freq: Every day | ORAL | 2 refills | Status: DC

## 2017-08-26 NOTE — Discharge Summary (Signed)
Physician Discharge Summary  Patient ID: Melinda Woods MRN: [id number] DOB/AGE: 1965/08/30 52 y.o.  Admit date: 08/25/2017 Discharge date: 08/26/2017  Admission Diagnoses: Principal Problem:   Musculoskeletal chest pain Active Problems:   Hypertension   Chronic diastolic CHF (congestive heart failure) (HCC)   Morbid obesity (HCC)   RA (rheumatoid arthritis) (HCC)   Discharge Diagnoses:  Principal Problem:   Musculoskeletal chest pain Active Problems:   Hypertension   Chronic diastolic CHF (congestive heart failure) (HCC)   Morbid obesity (HCC)   RA (rheumatoid arthritis) (HCC)   Discharged Condition: good  Hospital Course: 52 y.o. female  With h/o  HTN, RA, asthma, dyslipidemia, chronic dCHF, anxiety, peripheral neuropathy, fibromyalgia and chronic pain syndrome who was transferred from [facility] ED for further evaluation of chest pain.  Chest pain started around 8 AM on 08/25/2017 at rest, chest pain was retrosternal and crushing in nature, it was associated with shortness of breath, nausea and diaphoresis, chest pain was not pleuritic, was not the patient has chronic leg and generalized aches and pain due to underlying fibromyalgia and chronic pain syndrome, cp  radiated to the left arm.  No new back pain, no fevers, no chills, no productive cough chest pain was partially relieved with nitro and morphine sulfate, patient also complains of increased lower extremity edema, she apparently missed her diuretics for 1 day.  ED Course--- partial relief of chest pain with morphine sulfate and nitro, which was sinus tachycardia in the ED with no acute ischemic type changes d-dimer negative, BNP unremarkable, chest x-ray without acute findings, EDP discussed case with on-call cardiologist who advised of observation overnight with severe troponins and EKG for ACS rule out prior to making decision on further cardiovascular risk stratification.  At the time of evaluation patient's  husband, daughter and other family members at bedside.  Pt reported she had a negative Lexiscan nuclear stress test at [facility] in [facility] a couple of years ago  Impression / Plan:  1)Atypical Chest Pain-  CP resolved overnight. Pt seen by cardiology, they felt this was likely non-cardiac musculoskeletal chest pain. troponins neg x 4 and EKG nonischemic.  CP possibly related to her FM/ chronic pain syndrome.  Recommending outpt cor CTA.  They will arrange w/ patient. Ok for DC.   2)HTN And H/o chronic diastolic CHF- continue Lasix, metoprolol and losartan  3)Dyslipidemia--- okay to give aspirin and started Lipitor, LDL was high.   4)Rheumatoid arthritis/fibromyalgia/chronic pain syndrome/neuropathy--- be judicious with opiates, patient sees rheumatologist Dr. Jamse Arn in [facility], apparently she did not do well with methotrexate, to give Elavil 100 mg twice daily, along with methocarbamol and gabapentin  5)Elevated LFTs in the patient with history of fatty liver--- AST higher than ALT, avoid hepatotoxic agents, check fasting lipid profile.  Patient denies alcohol use  6)H/o asthma--- stable at this time, no acute exacerbation, may use bronchodilators as needed     Discharge Exam: Blood pressure 131/86, pulse (!) 115, temperature 98.8 F (37.1 C), temperature source Oral, resp. rate (!) 22, weight 116.8 kg (257 lb 9.6 oz), last menstrual period 08/13/2017, SpO2 93 %. Alert, no distress, obese  No jvd Chest clear bilat Cor reg no mrg Abd soft obese ntnd Ext trace ankle edema, no wounds  NF ox 3  Disposition: Discharge disposition: 01-Home or Self Care        Allergies as of 08/26/2017      Reactions   Oxycodone Base Hives   Latex Rash  Medication List    TAKE these medications   acetaminophen 650 MG CR tablet Commonly known as:  TYLENOL Take 1,300 mg by mouth daily as needed for pain.   albuterol 108 (90 Base) MCG/ACT inhaler Commonly  known as:  PROVENTIL HFA;VENTOLIN HFA Inhale 1-2 puffs into the lungs every 6 (six) hours as needed for wheezing or shortness of breath.   amitriptyline 100 MG tablet Commonly known as:  ELAVIL Take 1 tablet (100 mg total) by mouth 2 (two) times daily.   aspirin EC 81 MG tablet Take 1 tablet (81 mg total) by mouth daily.   atorvastatin 40 MG tablet Commonly known as:  LIPITOR Take 1 tablet (40 mg total) by mouth daily at 6 PM.   dextromethorphan-guaiFENesin 30-600 MG 12hr tablet Commonly known as:  MUCINEX DM Take 1 tablet by mouth 2 (two) times daily.   DULoxetine 60 MG capsule Commonly known as:  CYMBALTA Take 60 mg by mouth daily.   furosemide 40 MG tablet Commonly known as:  LASIX Take 1 tablet (40 mg total) by mouth daily. Start taking on:  08/27/2017   HYDROcodone-acetaminophen 5-325 MG tablet Commonly known as:  NORCO/VICODIN Take 1.5 tablets by mouth every 4 (four) hours as needed for moderate pain. What changed:    how much to take  when to take this   hydrOXYzine 25 MG tablet Commonly known as:  ATARAX/VISTARIL Take 2 tablets (50 mg total) by mouth every 6 (six) hours as needed for anxiety.   ipratropium-albuterol 0.5-2.5 (3) MG/3ML Soln Commonly known as:  DUONEB Take 3 mLs by nebulization 2 (two) times daily. What changed:    when to take this  reasons to take this   losartan 25 MG tablet Commonly known as:  COZAAR Take 1 tablet (25 mg total) by mouth daily.   metoprolol tartrate 25 MG tablet Commonly known as:  LOPRESSOR Take 1 tablet (25 mg total) by mouth 2 (two) times daily.   montelukast 10 MG tablet Commonly known as:  SINGULAIR Take 10 mg by mouth daily. What changed:  Another medication with the same name was added. Make sure you understand how and when to take each.   montelukast 10 MG tablet Commonly known as:  SINGULAIR Take 1 tablet (10 mg total) by mouth daily. What changed:  You were already taking a medication with the same  name, and this prescription was added. Make sure you understand how and when to take each.   polyethylene glycol packet Commonly known as:  MIRALAX / GLYCOLAX Take 17 g by mouth daily as needed for mild constipation. What changed:  additional instructions   potassium chloride SA 20 MEQ tablet Commonly known as:  K-DUR,KLOR-CON Take 1 tablet (20 mEq total) by mouth daily as needed (always take with the Furosemide).      Follow-up Information    [facility] Office Follow up.   Specialty:  Cardiology Why:  Someone from the office will contact you directly with an appointment date/time for your outpatient CT scan of the arteries around your heart.  Contact information: [address], [address] [place] [address] [phone]          Signed: Maree Krabbe 08/26/2017, 1:28 PM

## 2017-08-26 NOTE — Discharge Instructions (Signed)
You would benefit from additional heart testing outpatient. We would like for you to undergo a CT scan of your heart to look at the arteries and how well blood flows through them. Someone from the [facility] office will contact you directly with an appointment date and time. You have been given a prescription for an extra dose of metoprolol to take on the day of your exam.

## 2017-08-26 NOTE — Consult Note (Addendum)
Cardiology Consultation:   Patient ID: Melinda Woods; [id number]; 19-Jun-1965   Admit date: 08/25/2017 Date of Consult: 08/26/2017  Primary Care Provider: Leola Brazil, DO Primary Cardiologist: New to [facility]; Dr. Royann Shivers  Primary Electrophysiologist:  None   Patient Profile:   Melinda Woods is a 52 y.o. female with a PMH of HTN, HLD, chronic diastolic CHF, RA, asthma, fibromyalgia, peripheral neuropathy, and chronic pain syndrome who is being seen today for the evaluation of chest pain at the request of Dr. Arlean Hopping.  History of Present Illness:   [name] was in her usual state of health until the morning of 08/25/17 when she began experiencing left sided chest pressure, associated with SOB, dizziness, and radiation of pain to the left arm. She attributes her symptoms to having forgotten to take her medications on 08/24/17. She also reported worsening LE on 08/24/17, for which she typically takes lasix prn.   She does not follow with a cardiologist. Her last ischemic evaluation was a stress test ~2 years ago at [facility] in [facility] which she reports was normal. Last echo 03/2016 with EF 55-60%, moderate LVH, no wall motion abnormalities, and G1DD. She denies family history of CAD. Risk factors for heart disease includes HTN, HLD, chronic diastolic CHF, and obesity.   At the time of this evaluation, she is beginning to experience left sided chest pain again. Her pain resolved yesterday evening after receiving her home medications and tramadol. She reports chest wall is painful to touch although denies recent changes in activity. She has chronic DOE which is unchanged. She denies exertional chest pain, orthopnea, or PND. She reports taking her weight daily and receives frequent calls from [employer] to assist with management of her CHF. She denies recent weight changes.   Hospital course: Tachycardic to the 130s on arrival to the ED, still in the 110s,  hypertensive to 180/125 on arrival, improved to 131/86 this morning, with intermittent tachypnea. Labs notable for K 3.4>3.8, Cr 0.78, Hgb 14.3>11.8, PLT wnl, BNP wnl, Trop negative x4, LDL 116, TSH wnl, Ddimer negative. EKG with sinus tachycardia without STE/D, no TWI; some non-specific T wave flattening. CXR without acute findings. Patient was admitted to medicine. CP somewhat improved with SL nitro and IV morphine. Cardiology consulted for further recommendations.     Past Medical History:  Diagnosis Date  . Anxiety   . Asthma   . CHF (congestive heart failure) (HCC)   . Fatty liver   . Fibromyalgia   . Hypertension   . Neuropathy   . Ovarian cyst   . RA (rheumatoid arthritis) (HCC)     Past Surgical History:  Procedure Laterality Date  . TUBAL LIGATION       Home Medications:  Prior to Admission medications   Medication Sig Start Date End Date Taking? Authorizing Provider  acetaminophen (TYLENOL) 650 MG CR tablet Take 1,300 mg by mouth daily as needed for pain.   Yes [provider]  albuterol (PROVENTIL HFA;VENTOLIN HFA) 108 (90 Base) MCG/ACT inhaler Inhale 1-2 puffs into the lungs every 6 (six) hours as needed for wheezing or shortness of breath.   Yes [provider]  amitriptyline (ELAVIL) 100 MG tablet Take 1 tablet (100 mg total) by mouth 2 (two) times daily. 04/26/16  Yes Calvert Cantor, MD  DULoxetine (CYMBALTA) 60 MG capsule Take 60 mg by mouth daily. 08/16/17  Yes [provider]  furosemide (LASIX) 40 MG tablet Take 1 tablet (40 mg total)  by mouth daily as needed for fluid (see instructions on d/c summary). Patient taking differently: Take 40 mg by mouth daily.  02/02/17  Yes Kathlen Mody, MD  HYDROcodone-acetaminophen (NORCO/VICODIN) 5-325 MG tablet Take 1-2 tablets by mouth every 6 (six) hours as needed for moderate pain. Patient taking differently: Take 1.5 tablets by mouth every 3 (three) hours as needed for moderate pain.  02/02/17  Yes Kathlen Mody, MD  ipratropium-albuterol (DUONEB) 0.5-2.5 (3) MG/3ML SOLN Take 3 mLs by nebulization 2 (two) times daily. Patient taking differently: Take 3 mLs by nebulization 2 (two) times daily as needed (shortness of breath/wheezing).  02/02/17  Yes Kathlen Mody, MD  metoprolol tartrate (LOPRESSOR) 25 MG tablet Take 1 tablet (25 mg total) by mouth 2 (two) times daily. 04/26/16  Yes Calvert Cantor, MD  montelukast (SINGULAIR) 10 MG tablet Take 10 mg by mouth daily. 08/22/17  Yes [provider]  polyethylene glycol (MIRALAX / GLYCOLAX) packet Take 17 g by mouth daily as needed for mild constipation. Patient taking differently: Take 17 g by mouth daily as needed for mild constipation. For constipation 04/26/16  Yes Rizwan, Ladell Heads, MD  dextromethorphan-guaiFENesin (MUCINEX DM) 30-600 MG 12hr tablet Take 1 tablet by mouth 2 (two) times daily. Patient not taking: Reported on 04/13/2017 02/02/17   Kathlen Mody, MD  hydrOXYzine (ATARAX/VISTARIL) 25 MG tablet Take 2 tablets (50 mg total) by mouth every 6 (six) hours as needed for anxiety. Patient not taking: Reported on 08/25/2017 02/22/17   Alvira Monday, MD  losartan (COZAAR) 25 MG tablet Take 1 tablet (25 mg total) by mouth daily. Patient not taking: Reported on 08/25/2017 04/26/16   Calvert Cantor, MD  potassium chloride SA (K-DUR,KLOR-CON) 20 MEQ tablet Take 1 tablet (20 mEq total) by mouth daily as needed (always take with the Furosemide). Patient not taking: Reported on 04/13/2017 04/26/16   Calvert Cantor, MD    Inpatient Medications: Scheduled Meds: . amitriptyline  100 mg Oral BID  . aspirin EC  81 mg Oral Daily  . atorvastatin  40 mg Oral q1800  . folic acid  1 mg Oral Daily  . furosemide  40 mg Oral Daily  . gabapentin  200 mg Oral TID  . heparin  5,000 Units Subcutaneous Q8H  . isosorbide mononitrate  30 mg Oral Daily  . losartan  25 mg Oral Daily  . methocarbamol  500 mg Oral TID  . metoprolol tartrate  25 mg Oral BID  . montelukast  10  mg Oral Daily  . polyethylene glycol  17 g Oral Daily  . senna-docusate  2 tablet Oral QHS  . sodium chloride flush  3 mL Intravenous Q12H   Continuous Infusions: . sodium chloride     PRN Meds: sodium chloride, acetaminophen **OR** acetaminophen, albuterol, hydrOXYzine, labetalol, nitroGLYCERIN, ondansetron **OR** ondansetron (ZOFRAN) IV, sodium chloride flush, traMADol, traZODone  Allergies:    Allergies  Allergen Reactions  . Oxycodone Base Hives  . Latex Rash    Social History:   Social History   Socioeconomic History  . Marital status: Married    Spouse name: Not on file  . Number of children: Not on file  . Years of education: Not on file  . Highest education level: Not on file  Occupational History  . Not on file  Social Needs  . Financial resource strain: Not on file  . Food insecurity:    Worry: Not on file    Inability: Not on file  . Transportation needs:  Medical: Not on file    Non-medical: Not on file  Tobacco Use  . Smoking status: Never Smoker  . Smokeless tobacco: Never Used  Substance and Sexual Activity  . Alcohol use: No  . Drug use: No  . Sexual activity: Yes    Birth control/protection: Surgical  Lifestyle  . Physical activity:    Days per week: Not on file    Minutes per session: Not on file  . Stress: Not on file  Relationships  . Social connections:    Talks on phone: Not on file    Gets together: Not on file    Attends religious service: Not on file    Active member of club or organization: Not on file    Attends meetings of clubs or organizations: Not on file    Relationship status: Not on file  . Intimate partner violence:    Fear of current or ex partner: Not on file    Emotionally abused: Not on file    Physically abused: Not on file    Forced sexual activity: Not on file  Other Topics Concern  . Not on file  Social History Narrative  . Not on file    Family History:    Family History  Problem Relation Age of Onset   . Stroke Mother   . Diabetes Mellitus II Mother   . Lung cancer Father      ROS:  Please see the history of present illness.   All other ROS reviewed and negative.     Physical Exam/Data:   Vitals:   08/25/17 1746 08/25/17 2031 08/25/17 2257 08/26/17 0538  BP: 124/81 114/86  131/86  Pulse: (!) 107 (!) 111 (!) 109 (!) 115  Resp: 18 18  (!) 22  Temp: 98.1 F (36.7 C) 99.6 F (37.6 C)  98.8 F (37.1 C)  TempSrc: Oral Oral  Oral  SpO2: 98% 97%  93%  Weight:    257 lb 9.6 oz (116.8 kg)    Intake/Output Summary (Last 24 hours) at 08/26/2017 0912 Last data filed at 08/26/2017 0540 Gross per 24 hour  Intake 243 ml  Output 1600 ml  Net -1357 ml   Filed Weights   08/26/17 0538  Weight: 257 lb 9.6 oz (116.8 kg)   Body mass index is 41.58 kg/m.  General:  Obese AAF laying in bed in no acute distress HEENT: sclera anicteric  Neck: no JVD appreciated Vascular: No carotid bruits; distal pulses 2+ bilaterally Cardiac:  normal S1, S2; RRR; no murmurs, gallops, or rubs; significant L chest wall TTP Lungs:  clear to auscultation bilaterally, no wheezing, rhonchi or rales  Abd: NABS, soft, obese, nontender, no hepatomegaly Ext: trace-1+ LE edema Musculoskeletal:  No deformities, BUE and BLE strength normal and equal Skin: warm and dry  Neuro:  CNs 2-12 intact, no focal abnormalities noted Psych:  Normal affect   EKG:  The EKG was personally reviewed and demonstrates:  sinus tachycardia without STE/D, no TWI; some non-specific T wave flattening. Telemetry:  Telemetry was personally reviewed and demonstrates:  Sinus rhythm with frequent sinus tachycardia  Relevant CV Studies: Echocardiogram 03/2016: ------------------------------------------------------------------- Study Conclusions  - Left ventricle: The cavity size was normal. Wall thickness was   increased in a pattern of moderate LVH. Systolic function was   normal. The estimated ejection fraction was in the range of 55%    to 60%. Wall motion was normal; there were no regional wall   motion abnormalities. Doppler parameters are  consistent with   abnormal left ventricular relaxation (grade 1 diastolic   dysfunction). The E/e&' ratio is <8, suggesting normal LV filling   pressure. - Left atrium: The atrium was normal in size. - Inferior vena cava: The vessel was normal in size. The   respirophasic diameter changes were in the normal range (>= 50%),   consistent with normal central venous pressure.  Impressions:  - Compared to a prior echo in 02/2014, there is no significant   change.  Laboratory Data:  Chemistry Recent Labs  Lab 08/25/17 0823 08/26/17 0714  NA 140 138  K 3.4* 3.8  CL 101 98  CO2 27 28  GLUCOSE 108* 114*  BUN 10 5*  CREATININE 0.79 0.78  CALCIUM 9.2 8.8*  GFRNONAA >60 >60  GFRAA >60 >60  ANIONGAP 12 12    Recent Labs  Lab 08/25/17 0823  PROT 9.3*  ALBUMIN 4.4  AST 64*  ALT 47*  ALKPHOS 94  BILITOT 0.5   Hematology Recent Labs  Lab 08/25/17 0823 08/26/17 0714  WBC 6.4 5.3  RBC 4.78 4.06  HGB 14.3 11.8*  HCT 43.3 38.2  MCV 90.6 94.1  MCH 29.9 29.1  MCHC 33.0 30.9  RDW 13.5 13.1  PLT 276 226   Cardiac Enzymes Recent Labs  Lab 08/25/17 0823 08/25/17 1833 08/26/17 0118 08/26/17 0714  TROPONINI <0.03 <0.03 <0.03 <0.03   No results for input(s): TROPIPOC in the last 168 hours.  BNP Recent Labs  Lab 08/25/17 0823  BNP 18.6    DDimer  Recent Labs  Lab 08/25/17 0823  DDIMER 0.37    Radiology/Studies:  Dg Chest 2 View  Result Date: 08/25/2017 CLINICAL DATA:  Patient with left upper chest pain. EXAM: CHEST - 2 VIEW COMPARISON:  Chest radiograph 04/13/2017 FINDINGS: Monitoring leads overlie the patient. Stable cardiac and mediastinal contours. No consolidative pulmonary opacities. No pleural effusion or pneumothorax. Thoracic spine degenerative changes. IMPRESSION: No acute cardiopulmonary process. Electronically Signed   By: Annia Belt M.D.   On:  08/25/2017 09:26    Assessment and Plan:   1. Atypical chest pain: patient reports sudden onset left sided chest pain 08/25/17 with associated dizziness, SOB, and radiation of pain to L arm. Also with significant TTP of left chest wall on exam. Trops negative x4. EKG non-ischemic. Symptoms and work-up not consistent with ACS. More likely MSK given chest wall TTP, possibly related to her fibromyalgia/ chronic pain syndrome - No further inpatient ischemic work-up recommended at this time. Will arrange outpatient Coronary CTA   2. HTN: BP elevated on presentation, likely 2/2 missing home medications 6/29. Improved this morning - Continue current regimen  3. HLD: LDL 116 this admission; goal <70 - Continue current statin - Encourage dietary compliance  4. Chronic diastolic CHF: with trace-1+ LE edema on exam, likely mildly increased due to missing home medications 6/29. BNP wnl and CXR with clear lungs. She reports weighing herself daily and frequent calls from [employer] for management of CHF. - Continue current regimen    For questions or updates, please contact [facility] Please consult [email or web address] for contact info under Cardiology/STEMI.   Signed, Beatriz Stallion, PA-C  08/26/2017 9:12 AM [phone]  I have seen and examined the patient along with Beatriz Stallion, PA-C.  I have reviewed the chart, notes and new data.  I agree with PA's note.  Key new complaints: symptoms are highly atypical and reproducible with palpation, but sh does have a rich array of  risk factors. Key examination changes: normal CV exam except obesity Key new findings / data: low risk ECG and biomarkers.  PLAN: No plan fo additional inpatient evaluation. Schedule outpatient coronary CTA.  Thurmon Fair, MD, [facility] [phone] 08/26/2017, 11:07 AM

## 2017-08-26 NOTE — Progress Notes (Signed)
Pharmacy Tech reviewed current medications with patient.  Patient is prescribed Norco 5-325mg  1-2 tablets every six hours.  However, patient reports that she has been taking 1.5 tablets every 3 hours.  She ran out of her 30-day supply in 10 days (last Norco taken was on 08/23/2017).

## 2017-10-02 ENCOUNTER — Other Ambulatory Visit: Payer: Self-pay | Admitting: *Deleted

## 2017-10-02 DIAGNOSIS — R079 Chest pain, unspecified: Secondary | ICD-10-CM

## 2017-10-23 ENCOUNTER — Other Ambulatory Visit: Payer: Self-pay

## 2017-10-23 ENCOUNTER — Telehealth: Payer: Self-pay

## 2017-10-23 NOTE — Telephone Encounter (Signed)
Called patient. Cardiac CT instructions reviewed. Patient verbalized understanding and agreed with plan. Patient will take 2 metoprolol 25 mg tablets 1 hr prior to her study on 10/29/17 at 10:30a.

## 2017-10-23 NOTE — Telephone Encounter (Signed)
Opened in error

## 2017-10-29 ENCOUNTER — Ambulatory Visit (HOSPITAL_COMMUNITY): Admission: RE | Admit: 2017-10-29 | Payer: 59 | Source: Ambulatory Visit

## 2017-10-29 ENCOUNTER — Ambulatory Visit (HOSPITAL_COMMUNITY): Payer: 59

## 2017-11-05 ENCOUNTER — Telehealth: Payer: Self-pay | Admitting: Physician Assistant

## 2017-11-05 NOTE — Telephone Encounter (Signed)
New message:      Pt is calling and wanting to speak with Barrett about possibly having to pay $200 out of pocket for this appt.

## 2017-11-05 NOTE — Telephone Encounter (Signed)
Called patient regarding her copay amount of being $200, I advised that normally the hospital copay would be that much, but the office copay would not be that much, they would run it when she comes into the office and let her know, but they normally are not $200 copay. Patient verbalized understanding.

## 2017-11-08 ENCOUNTER — Ambulatory Visit: Payer: 59 | Admitting: Physician Assistant

## 2017-11-12 ENCOUNTER — Emergency Department (HOSPITAL_BASED_OUTPATIENT_CLINIC_OR_DEPARTMENT_OTHER)
Admission: EM | Admit: 2017-11-12 | Discharge: 2017-11-12 | Disposition: A | Discharge status: Home / Self Care | Payer: 59 | Attending: Emergency Medicine | Admitting: Emergency Medicine

## 2017-11-12 ENCOUNTER — Emergency Department (HOSPITAL_BASED_OUTPATIENT_CLINIC_OR_DEPARTMENT_OTHER): Payer: 59

## 2017-11-12 ENCOUNTER — Other Ambulatory Visit: Payer: Self-pay

## 2017-11-12 ENCOUNTER — Encounter (HOSPITAL_BASED_OUTPATIENT_CLINIC_OR_DEPARTMENT_OTHER): Payer: Self-pay

## 2017-11-12 DIAGNOSIS — Z7982 Long term (current) use of aspirin: Secondary | ICD-10-CM | POA: Diagnosis not present

## 2017-11-12 DIAGNOSIS — M79672 Pain in left foot: Secondary | ICD-10-CM | POA: Diagnosis not present

## 2017-11-12 DIAGNOSIS — I5032 Chronic diastolic (congestive) heart failure: Secondary | ICD-10-CM | POA: Diagnosis not present

## 2017-11-12 DIAGNOSIS — I110 Hypertensive heart disease with heart failure: Secondary | ICD-10-CM | POA: Diagnosis not present

## 2017-11-12 DIAGNOSIS — Z79899 Other long term (current) drug therapy: Secondary | ICD-10-CM | POA: Insufficient documentation

## 2017-11-12 DIAGNOSIS — Z8673 Personal history of transient ischemic attack (TIA), and cerebral infarction without residual deficits: Secondary | ICD-10-CM | POA: Diagnosis not present

## 2017-11-12 DIAGNOSIS — M79671 Pain in right foot: Secondary | ICD-10-CM

## 2017-11-12 DIAGNOSIS — R609 Edema, unspecified: Secondary | ICD-10-CM | POA: Diagnosis present

## 2017-11-12 DIAGNOSIS — J45909 Unspecified asthma, uncomplicated: Secondary | ICD-10-CM | POA: Diagnosis not present

## 2017-11-12 LAB — BASIC METABOLIC PANEL
Anion gap: 9 (ref 5–15)
BUN: 9 mg/dL (ref 6–20)
CALCIUM: 9.4 mg/dL (ref 8.9–10.3)
CO2: 31 mmol/L (ref 22–32)
CREATININE: 0.63 mg/dL (ref 0.44–1.00)
Chloride: 98 mmol/L (ref 98–111)
GFR calc Af Amer: >60 mL/min (ref >60)
GFR calc non Af Amer: >60 mL/min (ref >60)
GLUCOSE: 100 mg/dL — AB (ref 70–99)
Potassium: 3.6 mmol/L (ref 3.5–5.1)
Sodium: 138 mmol/L (ref 135–145)

## 2017-11-12 LAB — CBC
HEMATOCRIT: 38.4 % (ref 36.0–46.0)
Hemoglobin: 12.1 g/dL (ref 12.0–15.0)
MCH: 28.9 pg (ref 26.0–34.0)
MCHC: 31.5 g/dL (ref 30.0–36.0)
MCV: 91.6 fL (ref 78.0–100.0)
Platelets: 239 10*3/uL (ref 150–400)
RBC: 4.19 MIL/uL (ref 3.87–5.11)
RDW: 14 % (ref 11.5–15.5)
WBC: 6 10*3/uL (ref 4.0–10.5)

## 2017-11-12 MED ORDER — KETOROLAC TROMETHAMINE 60 MG/2ML IM SOLN
60.0000 mg | Freq: Once | INTRAMUSCULAR | Status: AC
  Administered 2017-11-12: 60 mg via INTRAMUSCULAR
  Filled 2017-11-12: qty 2

## 2017-11-12 MED ORDER — ETODOLAC 300 MG PO CAPS: 300.0000 mg | ORAL_CAPSULE | Freq: Three times a day (TID) | ORAL | 0 refills | Status: DC

## 2017-11-12 NOTE — ED Provider Notes (Signed)
[facility] EMERGENCY DEPARTMENT Provider Note   CSN: [id number] Arrival date & time: 11/12/17  1733     History   Chief Complaint Chief Complaint  Patient presents with  . Edema    HPI Melinda Woods is a 52 y.o. female.  HPI Patient presents to the emergency room for evaluation of swelling in her feet.  Patient states she has a history of swelling but the last couple of days she feels like it is worse in her feet do not look like they normally do.  Patient states she has a history of rheumatoid arthritis, neuropathy, and fibromyalgia.  Patient states she has been told there is no medications that she can take right now for her rheumatoid arthritis.  Patient states she has been given pain medication in the past but it does not really help.  Swelling in her feet is painful.  It hurts to touch.  She denies any fevers or chills.  No chest pain or shortness of breath. Past Medical History:  Diagnosis Date  . Anxiety   . Asthma   . CHF (congestive heart failure) (HCC)   . Fatty liver   . Fibromyalgia   . Hypertension   . Neuropathy   . Ovarian cyst   . RA (rheumatoid arthritis) [facility])     Patient Active Problem List   Diagnosis Date Noted  . Musculoskeletal chest pain 08/26/2017  . Asthma exacerbation 04/13/2017  . Asthma exacerbation attacks 04/13/2017  . Hypokalemia 04/13/2017  . Acute bronchitis 01/29/2017  . Depression 01/29/2017  . SIRS (systemic inflammatory response syndrome) (HCC) 01/29/2017  . RA (rheumatoid arthritis) (HCC)   . Acute respiratory failure (HCC) 04/26/2016  . Fatty liver 04/25/2016  . Hypertension 04/23/2016  . Chronic diastolic CHF (congestive heart failure) (HCC) 04/23/2016  . Morbid obesity (HCC) 04/23/2016  . Peripheral neuropathy 04/23/2016  . Right sided weakness 03/13/2014  . TIA (transient ischemic attack) 03/13/2014    Past Surgical History:  Procedure Laterality Date  . TUBAL LIGATION       OB History   None       Home Medications    Prior to Admission medications   Medication Sig Start Date End Date Taking? Authorizing Provider  acetaminophen (TYLENOL) 650 MG CR tablet Take 1,300 mg by mouth daily as needed for pain.    [provider]  albuterol (PROVENTIL HFA;VENTOLIN HFA) 108 (90 Base) MCG/ACT inhaler Inhale 1-2 puffs into the lungs every 6 (six) hours as needed for wheezing or shortness of breath.    [provider]  amitriptyline (ELAVIL) 100 MG tablet Take 1 tablet (100 mg total) by mouth 2 (two) times daily. 04/26/16   Calvert Cantor, MD  aspirin EC 81 MG tablet Take 1 tablet (81 mg total) by mouth daily. 08/26/17   Delano Metz, MD  atorvastatin (LIPITOR) 40 MG tablet Take 1 tablet (40 mg total) by mouth daily at 6 PM. 08/26/17   Delano Metz, MD  dextromethorphan-guaiFENesin [facility] DM) 30-600 MG 12hr tablet Take 1 tablet by mouth 2 (two) times daily. Patient not taking: Reported on 04/13/2017 02/02/17   Kathlen Mody, MD  DULoxetine (CYMBALTA) 60 MG capsule Take 60 mg by mouth daily. 08/16/17   [provider]  etodolac (LODINE) 300 MG capsule Take 1 capsule (300 mg total) by mouth every 8 (eight) hours. 11/12/17   Linwood Dibbles, MD  furosemide (LASIX) 40 MG tablet Take 1 tablet (40 mg total) by mouth daily. 08/27/17   Delano Metz, MD  HYDROcodone-acetaminophen (NORCO/VICODIN) 5-325 MG tablet Take 1.5 tablets by mouth every 4 (four) hours as needed for moderate pain. 08/26/17   Delano Metz, MD  hydrOXYzine (ATARAX/VISTARIL) 25 MG tablet Take 2 tablets (50 mg total) by mouth every 6 (six) hours as needed for anxiety. Patient not taking: Reported on 08/25/2017 02/22/17   Alvira Monday, MD  ipratropium-albuterol (DUONEB) 0.5-2.5 (3) MG/3ML SOLN Take 3 mLs by nebulization 2 (two) times daily. Patient taking differently: Take 3 mLs by nebulization 2 (two) times daily as needed (shortness of breath/wheezing).  02/02/17   Kathlen Mody, MD  losartan (COZAAR) 25 MG tablet  Take 1 tablet (25 mg total) by mouth daily. Patient not taking: Reported on 08/25/2017 04/26/16   Calvert Cantor, MD  metoprolol tartrate (LOPRESSOR) 25 MG tablet Take 1 tablet (25 mg total) by mouth 2 (two) times daily. 04/26/16   Calvert Cantor, MD  montelukast (SINGULAIR) 10 MG tablet Take 10 mg by mouth daily. 08/22/17   [provider]  montelukast (SINGULAIR) 10 MG tablet Take 1 tablet (10 mg total) by mouth daily. 08/26/17 08/26/18  Delano Metz, MD  polyethylene glycol (MIRALAX / Ethelene Hal) packet Take 17 g by mouth daily as needed for mild constipation. Patient taking differently: Take 17 g by mouth daily as needed for mild constipation. For constipation 04/26/16   Calvert Cantor, MD  potassium chloride SA (K-DUR,KLOR-CON) 20 MEQ tablet Take 1 tablet (20 mEq total) by mouth daily as needed (always take with the Furosemide). Patient not taking: Reported on 04/13/2017 04/26/16   Calvert Cantor, MD    Family History Family History  Problem Relation Age of Onset  . Stroke Mother   . Diabetes Mellitus II Mother   . Lung cancer Father     Social History Social History   Tobacco Use  . Smoking status: Never Smoker  . Smokeless tobacco: Never Used  Substance Use Topics  . Alcohol use: No  . Drug use: No     Allergies   Latex   Review of Systems Review of Systems  All other systems reviewed and are negative.    Physical Exam Updated Vital Signs BP 117/81 (BP Location: Right Arm)   Pulse 95   Temp 98.5 F (36.9 C) (Oral)   Resp 18   Ht 1.702 m (5\' 7" )   Wt 117.5 kg   LMP 11/06/2017   SpO2 94%   BMI 40.57 kg/m   Physical Exam  HENT:  Head: Normocephalic and atraumatic.  Right Ear: External ear normal.  Left Ear: External ear normal.  Eyes: Conjunctivae are normal. Right eye exhibits no discharge. Left eye exhibits no discharge. No scleral icterus.  Neck: Neck supple. No tracheal deviation present.  Cardiovascular: Normal rate, regular rhythm and intact distal  pulses.  Pulmonary/Chest: Effort normal and breath sounds normal. No stridor. No respiratory distress. She has no wheezes. She has no rales.  Abdominal: Soft. Bowel sounds are normal. She exhibits no distension. There is no tenderness. There is no rebound and no guarding.  Musculoskeletal: She exhibits edema. She exhibits no tenderness.  Mild edema bilateral feet, tenderness to palpation bilaterally, no erythema, no crepitus, calf tenderness or swelling, normal pulses and sensation  Neurological: She is alert. She has normal strength. No cranial nerve deficit (no facial droop, extraocular movements intact, no slurred speech) or sensory deficit. She exhibits normal muscle tone. She displays no seizure activity. Coordination normal.  Skin: Skin is warm and dry. No rash noted. She is not diaphoretic.  Psychiatric:  She has a normal mood and affect.  Nursing note and vitals reviewed.    ED Treatments / Results  Labs (all labs ordered are listed, but only abnormal results are displayed) Labs Reviewed  BASIC METABOLIC PANEL - Abnormal; Notable for the following components:      Result Value   Glucose, Bld 100 (*)    All other components within normal limits  CBC    EKG None  Radiology Dg Foot Complete Left  Result Date: 11/12/2017 CLINICAL DATA:  Soft tissue swelling of the feet for 5 years with recent swelling about the ankle. EXAM: LEFT FOOT - COMPLETE 3+ VIEW COMPARISON:  MRI 12/03/2016 FINDINGS: Mild-to-moderate soft tissue swelling of the foot and ankle, nonspecific but may be secondary to venous insufficiency or third spacing of fluid. Inflammatory change is not entirely excluded but believed less likely given history of chronicity. No acute fracture nor bone destruction. Type 3 accessory navicular. No small dorsal calcaneal enthesophyte. Joint dislocation. IMPRESSION: Nonspecific soft tissue swelling about the ankle and foot question venous insufficiency or possibly third spacing of  fluid. No acute osseous abnormality. Electronically Signed   By: Tollie Eth M.D.   On: 11/12/2017 19:50   Dg Foot Complete Right  Result Date: 11/12/2017 CLINICAL DATA:  Swelling above the ankle.  No known injury.  Pain. EXAM: RIGHT FOOT COMPLETE - 3+ VIEW COMPARISON:  None. FINDINGS: No acute fracture nor joint dislocation. No bone destruction. Dorsal calcaneal enthesophyte is noted. Accessory ossicle adjacent to the tarsal navicular is identified. Mild soft tissue swelling about the midfoot and ankle, nonspecific is identified. IMPRESSION: Nonspecific mild soft tissue swelling about the ankle and midfoot question mild medius insufficiency or third spacing of fluid. No acute osseous abnormality. Electronically Signed   By: Tollie Eth M.D.   On: 11/12/2017 19:47    Procedures Procedures (including critical care time)  Medications Ordered in ED Medications  ketorolac (TORADOL) injection 60 mg (has no administration in time range)     Initial Impression / Assessment and Plan / ED Course  I have reviewed the triage vital signs and the nursing notes.  Pertinent labs & imaging results that were available during my care of the patient were reviewed by me and considered in my medical decision making (see chart for details).   Patient presented to the emergency room for evaluation of swelling and pain in both of her feet.  On exam the patient had a small amount of edema noted in her feet.  There is no evidence of erythema to suggest infection.  She had good perfusion and normal pulses.  Patient's laboratory tests are reassuring.  X-ray show mild soft tissue swelling but no acute bony abnormality.  Patient states she has seen several doctors in the past.  Has been on various medications and also saw pain management doctor.  Her symptoms may be related to neuropathy versus fibromyalgia.  I do not see any signs of infection or acute vascular compromise.  Plan on discharge home with anti-inflammatory pain  medications.  Follow-up with her primary care doctor.  Final Clinical Impressions(s) / ED Diagnoses   Final diagnoses:  Foot pain, bilateral    ED Discharge Orders         Ordered    etodolac (LODINE) 300 MG capsule  Every 8 hours    Note to Pharmacy:  As needed for pain   11/12/17 2038           Linwood Dibbles, MD 11/12/17 2038

## 2017-11-12 NOTE — Discharge Instructions (Addendum)
Take medications as prescribed, follow-up with your doctor to be evaluated if the symptoms persist

## 2017-11-12 NOTE — ED Triage Notes (Signed)
Pt hx swelling/pain to both feet x 2 days-hx of same but worse-NAD-to triage in w/c

## 2017-11-26 ENCOUNTER — Ambulatory Visit: Payer: 59 | Admitting: Physician Assistant

## 2017-11-26 NOTE — Progress Notes (Deleted)
Cardiology Office Note   Date:  11/26/2017   ID:  Melinda Woods, DOB January 18, 1966, MRN [id number]  PCP:  Melinda Brazil, DO  Cardiologist:  Melinda Fair, MD, 08/26/2017 in hospital Melinda Demark, PA-C   No chief complaint on file.   History of Present Illness: Melinda Woods is a 52 y.o. female with a history of HTN, HLD, chronic diastolic CHF, RA, asthma, fibromyalgia, peripheral neuropathy, and chronic pain syndrome.  08/26/2017, seen in hospital for chest pain, outpatient cardiac CT ordered, not yet performed  Melinda Woods presents for ***   Past Medical History:  Diagnosis Date  . Anxiety   . Asthma   . CHF (congestive heart failure) (HCC)   . Fatty liver   . Fibromyalgia   . Hypertension   . Neuropathy   . Ovarian cyst   . RA (rheumatoid arthritis) (HCC)     Past Surgical History:  Procedure Laterality Date  . TUBAL LIGATION      Current Outpatient Medications  Medication Sig Dispense Refill  . acetaminophen (TYLENOL) 650 MG CR tablet Take 1,300 mg by mouth daily as needed for pain.    [name] albuterol (PROVENTIL HFA;VENTOLIN HFA) 108 (90 Base) MCG/ACT inhaler Inhale 1-2 puffs into the lungs every 6 (six) hours as needed for wheezing or shortness of breath.    [name] amitriptyline (ELAVIL) 100 MG tablet Take 1 tablet (100 mg total) by mouth 2 (two) times daily.    [name] aspirin EC 81 MG tablet Take 1 tablet (81 mg total) by mouth daily.    [name] atorvastatin (LIPITOR) 40 MG tablet Take 1 tablet (40 mg total) by mouth daily at 6 PM. 60 tablet 2  . dextromethorphan-guaiFENesin (MUCINEX DM) 30-600 MG 12hr tablet Take 1 tablet by mouth 2 (two) times daily. (Patient not taking: Reported on 04/13/2017) 20 tablet 0  . DULoxetine (CYMBALTA) 60 MG capsule Take 60 mg by mouth daily.    [name] etodolac (LODINE) 300 MG capsule Take 1 capsule (300 mg total) by mouth every 8 (eight) hours. 21 capsule 0  . furosemide (LASIX) 40 MG tablet Take 1 tablet (40 mg total) by mouth daily. 30 tablet   .  HYDROcodone-acetaminophen (NORCO/VICODIN) 5-325 MG tablet Take 1.5 tablets by mouth every 4 (four) hours as needed for moderate pain. 30 tablet 0  . hydrOXYzine (ATARAX/VISTARIL) 25 MG tablet Take 2 tablets (50 mg total) by mouth every 6 (six) hours as needed for anxiety. (Patient not taking: Reported on 08/25/2017) 12 tablet 0  . ipratropium-albuterol (DUONEB) 0.5-2.5 (3) MG/3ML SOLN Take 3 mLs by nebulization 2 (two) times daily. (Patient taking differently: Take 3 mLs by nebulization 2 (two) times daily as needed (shortness of breath/wheezing). ) 360 mL 0  . losartan (COZAAR) 25 MG tablet Take 1 tablet (25 mg total) by mouth daily. (Patient not taking: Reported on 08/25/2017) 30 tablet 0  . metoprolol tartrate (LOPRESSOR) 25 MG tablet Take 1 tablet (25 mg total) by mouth 2 (two) times daily. 60 tablet 0  . montelukast (SINGULAIR) 10 MG tablet Take 10 mg by mouth daily.    . montelukast (SINGULAIR) 10 MG tablet Take 1 tablet (10 mg total) by mouth daily. 30 tablet 2  . polyethylene glycol (MIRALAX / GLYCOLAX) packet Take 17 g by mouth daily as needed for mild constipation. (Patient taking differently: Take 17 g by mouth daily as needed for mild constipation. For constipation) 14 each 0  . potassium chloride SA (K-DUR,KLOR-CON) 20 MEQ tablet Take 1 tablet (  20 mEq total) by mouth daily as needed (always take with the Furosemide). (Patient not taking: Reported on 04/13/2017) 30 tablet 0   No current facility-administered medications for this visit.     Allergies:   Latex    Social History:  The patient  reports that she has never smoked. She has never used smokeless tobacco. She reports that she does not drink alcohol or use drugs.   Family History:  The patient's family history includes Diabetes Mellitus II in her mother; Lung cancer in her father; Stroke in her mother.    ROS:  Please see the history of present illness. All other systems are reviewed and negative.    PHYSICAL EXAM: VS:  LMP  11/06/2017  , BMI There is no height or weight on file to calculate BMI. GEN: Well nourished, well developed, female in no acute distress  HEENT: normal for age  Neck: no JVD, no carotid bruit, no masses Cardiac: RRR; no murmur, no rubs, or gallops Respiratory:  clear to auscultation bilaterally, normal work of breathing GI: soft, nontender, nondistended, + BS MS: no deformity or atrophy; no edema; distal pulses are 2+ in all 4 extremities   Skin: warm and dry, no rash Neuro:  Strength and sensation are intact Psych: euthymic mood, full affect   EKG:  EKG {ACTION; IS/IS AYT:[id number]} ordered today. The ekg ordered today demonstrates ***   Recent Labs: 08/25/2017: ALT 47; B Natriuretic Peptide 18.6; Magnesium 2.0 08/26/2017: TSH 1.248 11/12/2017: BUN 9; Creatinine, Ser 0.63; Hemoglobin 12.1; Platelets 239; Potassium 3.6; Sodium 138    Lipid Panel    Component Value Date/Time   CHOL 179 08/26/2017 0118   TRIG 187 (H) 08/26/2017 0118   HDL 26 (L) 08/26/2017 0118   CHOLHDL 6.9 08/26/2017 0118   VLDL 37 08/26/2017 0118   LDLCALC 116 (H) 08/26/2017 0118     Wt Readings from Last 3 Encounters:  11/12/17 259 lb (117.5 kg)  08/26/17 257 lb 9.6 oz (116.8 kg)  04/13/17 265 lb 3.4 oz (120.3 kg)     Other studies Reviewed: Additional studies/ records that were reviewed today include: ***.  ASSESSMENT AND PLAN:  1.  ***   Current medicines are reviewed at length with the patient today.  The patient {ACTIONS; HAS/DOES NOT HAVE:19233} concerns regarding medicines.  The following changes have been made:  {PLAN; NO CHANGE:13088:s}  Labs/ tests ordered today include: *** No orders of the defined types were placed in this encounter.    Disposition:   FU with Dr. Royann Shivers  Signed, Melinda Demark, PA-C  11/26/2017 1:17 PM    [facility] Phone: [phone]; Fax: [phone]  This note was written with the assistance of speech recognition software.  Please excuse any transcriptional errors.

## 2017-11-28 ENCOUNTER — Encounter: Payer: 59 | Admitting: *Deleted

## 2017-11-28 ENCOUNTER — Ambulatory Visit (HOSPITAL_COMMUNITY)
Admission: RE | Admit: 2017-11-28 | Discharge: 2017-11-28 | Disposition: A | Discharge status: Home / Self Care | Payer: 59 | Source: Ambulatory Visit | Attending: Cardiovascular Disease | Admitting: Cardiovascular Disease

## 2017-11-28 DIAGNOSIS — R0789 Other chest pain: Secondary | ICD-10-CM

## 2017-11-28 DIAGNOSIS — Z006 Encounter for examination for normal comparison and control in clinical research program: Secondary | ICD-10-CM

## 2017-11-28 DIAGNOSIS — K760 Fatty (change of) liver, not elsewhere classified: Secondary | ICD-10-CM | POA: Insufficient documentation

## 2017-11-28 DIAGNOSIS — R911 Solitary pulmonary nodule: Secondary | ICD-10-CM | POA: Diagnosis not present

## 2017-11-28 DIAGNOSIS — R079 Chest pain, unspecified: Secondary | ICD-10-CM | POA: Insufficient documentation

## 2017-11-28 MED ORDER — IOPAMIDOL (ISOVUE-370) INJECTION 76%
100.0000 mL | Freq: Once | INTRAVENOUS | Status: AC | PRN
  Administered 2017-11-28: 80 mL via INTRAVENOUS

## 2017-11-28 MED ORDER — NITROGLYCERIN 0.4 MG SL SUBL
0.8000 mg | SUBLINGUAL_TABLET | Freq: Once | SUBLINGUAL | Status: AC
  Administered 2017-11-28: 0.8 mg via SUBLINGUAL
  Filled 2017-11-28: qty 25

## 2017-11-28 MED ORDER — METOPROLOL TARTRATE 5 MG/5ML IV SOLN
5.0000 mg | INTRAVENOUS | Status: DC | PRN
  Administered 2017-11-28 (×4): 5 mg via INTRAVENOUS
  Filled 2017-11-28 (×5): qty 5

## 2017-11-28 MED ORDER — DILTIAZEM HCL 25 MG/5ML IV SOLN
INTRAVENOUS | Status: AC
  Administered 2017-11-28: 5 mg
  Filled 2017-11-28: qty 5

## 2017-11-28 MED ORDER — NITROGLYCERIN 0.4 MG SL SUBL
SUBLINGUAL_TABLET | SUBLINGUAL | Status: AC
  Filled 2017-11-28: qty 2

## 2017-11-28 MED ORDER — DILTIAZEM HCL 25 MG/5ML IV SOLN
10.0000 mg | Freq: Once | INTRAVENOUS | Status: AC
  Administered 2017-11-28: 5 mg via INTRAVENOUS
  Filled 2017-11-28: qty 5

## 2017-11-28 MED ORDER — METOPROLOL TARTRATE 5 MG/5ML IV SOLN
INTRAVENOUS | Status: AC
  Filled 2017-11-28: qty 20

## 2017-11-28 NOTE — Research (Signed)
Melinda Woods came in for possible participation in the CADFEM trial. The trial was explained to Melinda Woods and her husband including the risk /benefits. Melinda Woods had an opportunity to read the consent and ask questions. The informed consent was signed and a copy was given to the patient. No study related procedures were performed prior to the consent being signed.

## 2017-11-28 NOTE — Progress Notes (Signed)
CT scan completed. Tolerated well. D/C home in wheelchair with husband. Awake and alert. In no distress. 

## 2017-12-09 ENCOUNTER — Ambulatory Visit (INDEPENDENT_AMBULATORY_CARE_PROVIDER_SITE_OTHER): Payer: 59 | Admitting: Physician Assistant

## 2017-12-09 ENCOUNTER — Encounter: Payer: Self-pay | Admitting: Physician Assistant

## 2017-12-09 VITALS — BP 112/80 | HR 94 | Ht 66.0 in | Wt 263.8 lb

## 2017-12-09 DIAGNOSIS — R600 Localized edema: Secondary | ICD-10-CM

## 2017-12-09 DIAGNOSIS — R0789 Other chest pain: Secondary | ICD-10-CM

## 2017-12-09 NOTE — Progress Notes (Signed)
Cardiology Office Note   Date:  12/09/2017   ID:  Melinda Woods, DOB 1965/08/07, MRN [id number]  PCP:  Leola Brazil, DO Cardiologist:  Melinda Fair, MD 08/26/2017 in-hospital Theodore Demark, PA-C    History of Present Illness: Melinda Woods is a 52 y.o. female with a history of HTN, HLD, chronic diastolic CHF, RA, asthma, fibromyalgia, peripheral neuropathy, and chronic pain syndrome.   08/26/2017 hospital consult note for atypical CP, outpt cardiac CT, volume up slightly 09/17 ER visit for edema, mild swelling and pain noted, ?2nd neuropathy vs fibromyalgia 10/03 cardiac CT, no CAD and Ca++ score zero  Melinda Woods presents for cardiology follow up.  Her feet hurt. She has tendonitis, neuropathy, bone spurs, RA and fibromyalgia.  She has not had any more chest pain. Previous symptoms have resolved. She feels she had forgotten to take her medicine, and that caused her pain.  Her blood pressure was also very elevated at night.  She has not missed any more of her medications.  She is compliant with her Lasix. She has chronic pedal edema, none otherwise. She will wake w/ the edema, says it gets worse during the day. She has compression socks but could not stand to wear them, they were painful to wear.   She does not wake in the night because of SOB. Sometimes her feet wake her.  No orthopnea.  She does not take any meds for the RA. She has tried several that did not help keep her feet from hurting. The Rheumatologist told her there was nothing else to try.  She wonders if the meds are causing her hair loss.   She is not using the nebs much. Uses the inhaler at times.  She gets short of breath doing household tasks such as cooking dinner, but the inhaler helps.  No recent change in her level of dyspnea on exertion.    Past Medical History:  Diagnosis Date  . Anxiety   . Asthma   . CHF (congestive heart failure) (HCC)    grade 1 dd on echo 03/2016  . Fatty liver     . Fibromyalgia   . Hypertension   . Neuropathy   . Ovarian cyst   . RA (rheumatoid arthritis) (HCC)     Past Surgical History:  Procedure Laterality Date  . TUBAL LIGATION      Current Outpatient Medications  Medication Sig Dispense Refill  . acetaminophen (TYLENOL) 650 MG CR tablet Take 1,300 mg by mouth daily as needed for pain.    [name] albuterol (PROVENTIL HFA;VENTOLIN HFA) 108 (90 Base) MCG/ACT inhaler Inhale 1-2 puffs into the lungs every 6 (six) hours as needed for wheezing or shortness of breath.    [name] amitriptyline (ELAVIL) 100 MG tablet Take 1 tablet (100 mg total) by mouth 2 (two) times daily.    [name] aspirin EC 81 MG tablet Take 1 tablet (81 mg total) by mouth daily.    [name] dextromethorphan-guaiFENesin (MUCINEX DM) 30-600 MG 12hr tablet Take 1 tablet by mouth 2 (two) times daily. 20 tablet 0  . DULoxetine (CYMBALTA) 60 MG capsule Take 60 mg by mouth daily.    . furosemide (LASIX) 40 MG tablet Take 1 tablet (40 mg total) by mouth daily. 30 tablet   . hydrOXYzine (ATARAX/VISTARIL) 25 MG tablet Take 2 tablets (50 mg total) by mouth every 6 (six) hours as needed for anxiety. 12 tablet 0  . ipratropium-albuterol (DUONEB) 0.5-2.5 (3) MG/3ML SOLN Take 3 mLs by nebulization 2 (  two) times daily. (Patient taking differently: Take 3 mLs by nebulization 2 (two) times daily as needed (shortness of breath/wheezing). ) 360 mL 0  . losartan (COZAAR) 25 MG tablet Take 1 tablet (25 mg total) by mouth daily. 30 tablet 0  . metoprolol tartrate (LOPRESSOR) 25 MG tablet Take 1 tablet (25 mg total) by mouth 2 (two) times daily. 60 tablet 0  . montelukast (SINGULAIR) 10 MG tablet Take 10 mg by mouth daily.    . montelukast (SINGULAIR) 10 MG tablet Take 1 tablet (10 mg total) by mouth daily. 30 tablet 2  . polyethylene glycol (MIRALAX / GLYCOLAX) packet Take 17 g by mouth daily as needed for mild constipation. (Patient taking differently: Take 17 g by mouth daily as needed for mild constipation. For  constipation) 14 each 0  . potassium chloride SA (K-DUR,KLOR-CON) 20 MEQ tablet Take 1 tablet (20 mEq total) by mouth daily as needed (always take with the Furosemide). 30 tablet 0   No current facility-administered medications for this visit.     Allergies:   Latex    Social History:  The patient  reports that she has never smoked. She has never used smokeless tobacco. She reports that she does not drink alcohol or use drugs.   Family History:  The patient's family history includes Diabetes Mellitus II in her mother; Lung cancer in her father; Stroke in her mother.  She indicated that the status of her mother is unknown. She indicated that the status of her father is unknown.    ROS:  Please see the history of present illness. All other systems are reviewed and negative.    PHYSICAL EXAM: VS:  BP 112/80   Pulse 94   Ht 5\' 6"  (1.676 m)   Wt 263 lb 12.8 oz (119.7 kg)   SpO2 94%   BMI 42.58 kg/m  , BMI Body mass index is 42.58 kg/m. GEN: Well nourished, well developed, female in no acute distress HEENT: normal for age  Neck: no JVD, no carotid bruit, no masses Cardiac: RRR; no murmur, no rubs, or gallops Respiratory:  clear to auscultation bilaterally, normal work of breathing GI: soft, nontender, nondistended, + BS MS: no deformity or atrophy; mild pedal edema; distal pulses are 2+ in all 4 extremities  Skin: warm and dry, no rash Neuro:  Strength and sensation are intact Psych: euthymic mood, full affect   EKG:  EKG is not ordered today.   Echocardiogram 03/2016: ------------------------------------------------------------------- Study Conclusions  - Left ventricle: The cavity size was normal. Wall thickness was increased in a pattern of moderate LVH. Systolic function was normal. The estimated ejection fraction was in the range of 55% to 60%. Wall motion was normal; there were no regional wall motion abnormalities. Doppler parameters are consistent  with abnormal left ventricular relaxation (grade 1 diastolic dysfunction). The E/e&' ratio is <8, suggesting normal LV filling pressure. - Left atrium: The atrium was normal in size. - Inferior vena cava: The vessel was normal in size. The respirophasic diameter changes were in the normal range (>= 50%), consistent with normal central venous pressure.  Impressions:  - Compared to a prior echo in 02/2014, there is no significant change.   CARDAIC CT: 11/28/2017 Calcium Score: No calcium detected  Coronary Arteries: Left or co- dominant with no anomalies  LM: Normal  LAD: Normal  D1: Normal  D2: Normal  Circumflex: Normal  OM1: Normal small  OM2: Normal large vessel  OM3: Normal  RCA: Normal  PDA:  Normal  PLA: Normal  IMPRESSION: 1. Calcium score 0  2.  Normal left or codominant coronary arteries  3.  Normal aortic root 3.0 cm  Would not repeat cardiac CT in future HR 75 despite use of 20 mg iv lopressor and 15 mg of iv Cardizem    Recent Labs: 08/25/2017: ALT 47; B Natriuretic Peptide 18.6; Magnesium 2.0 08/26/2017: TSH 1.248 11/12/2017: BUN 9; Creatinine, Ser 0.63; Hemoglobin 12.1; Platelets 239; Potassium 3.6; Sodium 138  CBC    Component Value Date/Time   WBC 6.0 11/12/2017 1851   RBC 4.19 11/12/2017 1851   HGB 12.1 11/12/2017 1851   HCT 38.4 11/12/2017 1851   PLT 239 11/12/2017 1851   MCV 91.6 11/12/2017 1851   MCH 28.9 11/12/2017 1851   MCHC 31.5 11/12/2017 1851   RDW 14.0 11/12/2017 1851   LYMPHSABS 2.0 04/13/2017 1448   MONOABS 0.3 04/13/2017 1448   EOSABS 0.1 04/13/2017 1448   BASOSABS 0.0 04/13/2017 1448   CMP Latest Ref Rng & Units 11/12/2017 08/26/2017 08/25/2017  Glucose 70 - 99 mg/dL 937(J) 696(V) 893(Y)  BUN 6 - 20 mg/dL 9 5(L) 10  Creatinine 1.01 - 1.00 mg/dL 7.51 0.25 8.52  Sodium 135 - 145 mmol/L 138 138 140  Potassium 3.5 - 5.1 mmol/L 3.6 3.8 3.4(L)  Chloride 98 - 111 mmol/L 98 98 101  CO2 22 - 32  mmol/L 31 28 27   Calcium 8.9 - 10.3 mg/dL 9.4 7.7(O) 9.2  Total Protein 6.5 - 8.1 g/dL - - 9.3(H)  Total Bilirubin 0.3 - 1.2 mg/dL - - 0.5  Alkaline Phos 38 - 126 U/L - - 94  AST 15 - 41 U/L - - 64(H)  ALT 0 - 44 U/L - - 47(H)     Lipid Panel    Component Value Date/Time   CHOL 179 08/26/2017 0118   TRIG 187 (H) 08/26/2017 0118   HDL 26 (L) 08/26/2017 0118   CHOLHDL 6.9 08/26/2017 0118   VLDL 37 08/26/2017 0118   LDLCALC 116 (H) 08/26/2017 0118     Wt Readings from Last 3 Encounters:  12/09/17 263 lb 12.8 oz (119.7 kg)  11/12/17 259 lb (117.5 kg)  08/26/17 257 lb 9.6 oz (116.8 kg)     Other studies Reviewed: Additional studies/ records that were reviewed today include: Hospital records and testing.  ASSESSMENT AND PLAN:  1.  Chest pain: No CAD on cardiac CT and symptoms have resolved.  No further work-up  2.  Lower extremity edema: This is a chronic problem for her.  She does not have significant volume overload by exam.  Distal pulses are good.  Continue Lasix 40 mg a day.   Current medicines are reviewed at length with the patient today.  The patient does not have concerns regarding medicines.  The following changes have been made:  no change  Labs/ tests ordered today include:  No orders of the defined types were placed in this encounter.    Disposition:   FU with Melinda Fair, MD as needed  Signed, Theodore Demark, PA-C  12/09/2017 12:02 PM    [facility] Phone: [phone]; Fax: [phone]  This note was written with the assistance of speech recognition software.  Please excuse any transcriptional errors.

## 2017-12-09 NOTE — Patient Instructions (Signed)
Medication Instructions:  Your physician recommends that you continue on your current medications as directed. Please refer to the Current Medication list given to you today.  If you need a refill on your cardiac medications before your next appointment, please call your pharmacy.   Lab work: None ordered If you have labs (blood work) drawn today and your tests are completely normal, you will receive your results only by: [name] MyChart Message (if you have MyChart) OR . A paper copy in the mail If you have any lab test that is abnormal or we need to change your treatment, we will call you to review the results.  Testing/Procedures: None ordered  Follow-Up: At [facility], you and your health needs are our priority.  As part of our continuing mission to provide you with exceptional heart care, we have created designated Provider Care Teams.  These Care Teams include your primary Cardiologist (physician) and Advanced Practice Providers (APPs -  Physician Assistants and Nurse Practitioners) who all work together to provide you with the care you need, when you need it. You will need a follow up appointment as needed with Dr.Croitoru

## 2017-12-22 ENCOUNTER — Other Ambulatory Visit: Payer: Self-pay

## 2017-12-22 ENCOUNTER — Emergency Department (HOSPITAL_BASED_OUTPATIENT_CLINIC_OR_DEPARTMENT_OTHER)
Admission: EM | Admit: 2017-12-22 | Discharge: 2017-12-22 | Disposition: A | Discharge status: Home / Self Care | Payer: 59 | Attending: Emergency Medicine | Admitting: Emergency Medicine

## 2017-12-22 ENCOUNTER — Encounter (HOSPITAL_BASED_OUTPATIENT_CLINIC_OR_DEPARTMENT_OTHER): Payer: Self-pay | Admitting: Emergency Medicine

## 2017-12-22 DIAGNOSIS — M79672 Pain in left foot: Secondary | ICD-10-CM | POA: Insufficient documentation

## 2017-12-22 DIAGNOSIS — I5032 Chronic diastolic (congestive) heart failure: Secondary | ICD-10-CM | POA: Insufficient documentation

## 2017-12-22 DIAGNOSIS — Z91040 Latex allergy status: Secondary | ICD-10-CM | POA: Insufficient documentation

## 2017-12-22 DIAGNOSIS — J45909 Unspecified asthma, uncomplicated: Secondary | ICD-10-CM | POA: Diagnosis not present

## 2017-12-22 DIAGNOSIS — M79671 Pain in right foot: Secondary | ICD-10-CM | POA: Diagnosis not present

## 2017-12-22 DIAGNOSIS — Z8673 Personal history of transient ischemic attack (TIA), and cerebral infarction without residual deficits: Secondary | ICD-10-CM | POA: Insufficient documentation

## 2017-12-22 DIAGNOSIS — I110 Hypertensive heart disease with heart failure: Secondary | ICD-10-CM | POA: Diagnosis not present

## 2017-12-22 MED ORDER — KETOROLAC TROMETHAMINE 30 MG/ML IJ SOLN
60.0000 mg | Freq: Once | INTRAMUSCULAR | Status: AC
  Administered 2017-12-22: 30 mg via INTRAMUSCULAR
  Filled 2017-12-22: qty 2

## 2017-12-22 NOTE — ED Provider Notes (Signed)
Emergency Department Provider Note   I have reviewed the triage vital signs and the nursing notes.   HISTORY  Chief Complaint Foot Pain   HPI Melinda Woods is a 52 y.o. female with PMH of fibromyalgia, HTN, RA, and neuropathy presents to the emergency department for evaluation of bilateral foot pain.  Patient states that she has had this problem intermittently for the past 6 years.  She reports pain from neuropathy as well as fibromyalgia.  She is taking amitriptyline for these symptoms and is followed by her PCP.  She states she had a similar flare approximately 1 month prior and came to the emergency department to receive a shot of medication for pain and felt much better.  She denies any fevers or chills.  No shortness of breath or chest pain.  She has not noticed any rash.  No injury.  No radiation of pain symptoms.  Pain is moderate to severe and constant.  Pain is worse with movement or touching.   Past Medical History:  Diagnosis Date  . Anxiety   . Asthma   . CHF (congestive heart failure) (HCC)    grade 1 dd on echo 03/2016  . Fatty liver   . Fibromyalgia   . Hypertension   . Neuropathy   . Ovarian cyst   . RA (rheumatoid arthritis) [facility])     Patient Active Problem List   Diagnosis Date Noted  . Musculoskeletal chest pain 08/26/2017  . Asthma exacerbation 04/13/2017  . Asthma exacerbation attacks 04/13/2017  . Hypokalemia 04/13/2017  . Acute bronchitis 01/29/2017  . Depression 01/29/2017  . SIRS (systemic inflammatory response syndrome) (HCC) 01/29/2017  . RA (rheumatoid arthritis) (HCC)   . Acute respiratory failure (HCC) 04/26/2016  . Fatty liver 04/25/2016  . Hypertension 04/23/2016  . Chronic diastolic CHF (congestive heart failure) (HCC) 04/23/2016  . Morbid obesity (HCC) 04/23/2016  . Peripheral neuropathy 04/23/2016  . Right sided weakness 03/13/2014  . TIA (transient ischemic attack) 03/13/2014    Past Surgical History:  Procedure Laterality  Date  . TUBAL LIGATION      Allergies Latex  Family History  Problem Relation Age of Onset  . Stroke Mother   . Diabetes Mellitus II Mother   . Lung cancer Father     Social History Social History   Tobacco Use  . Smoking status: Never Smoker  . Smokeless tobacco: Never Used  Substance Use Topics  . Alcohol use: No  . Drug use: No    Review of Systems  Constitutional: No fever/chills Eyes: No visual changes. ENT: No sore throat. Cardiovascular: Denies chest pain. Respiratory: Denies shortness of breath. Gastrointestinal: No abdominal pain.  No nausea, no vomiting.  No diarrhea.  No constipation. Genitourinary: Negative for dysuria. Musculoskeletal: Negative for back pain. Bilateral foot pain.  Skin: Negative for rash. Neurological: Negative for headaches, focal weakness or numbness.  10-point ROS otherwise negative.  ____________________________________________   PHYSICAL EXAM:  VITAL SIGNS: ED Triage Vitals  Enc Vitals Group     BP 12/22/17 0722 128/74     Pulse Rate 12/22/17 0722 (!) 103     Resp 12/22/17 0722 20     Temp 12/22/17 0722 98.7 F (37.1 C)     Temp Source 12/22/17 0722 Oral     SpO2 12/22/17 0722 94 %     Weight 12/22/17 0723 264 lb 4 oz (119.9 kg)     Height 12/22/17 0723 5\' 6"  (1.676 m)     Pain Score 12/22/17  3149 10   Constitutional: Alert and oriented. Well appearing and in no acute distress. Eyes: Conjunctivae are normal.  Head: Atraumatic. Nose: No congestion/rhinnorhea. Mouth/Throat: Mucous membranes are moist.  Neck: No stridor.  Cardiovascular: Normal rate, regular rhythm. Good peripheral circulation. Grossly normal heart sounds. Normal DP/PT pulses bilaterally.  Respiratory: Normal respiratory effort.  Gastrointestinal: No distention.  Musculoskeletal: Bilateral foot tenderness diffusely without rash or warmth. No focal area of tenderness.  Neurologic: No gross focal neurologic deficits are appreciated.  Skin:  Skin is  warm, dry and intact. No rash noted.  ____________________________________________  RADIOLOGY  None ____________________________________________   PROCEDURES  Procedure(s) performed:   Procedures  None ____________________________________________   INITIAL IMPRESSION / ASSESSMENT AND PLAN / ED COURSE  Pertinent labs & imaging results that were available during my care of the patient were reviewed by me and considered in my medical decision making (see chart for details).  Patient presents to the emergency department with acute on chronic bilateral foot pain.  No injury.  No concern clinically for infection.  No evidence of volume overload.  Symptoms seem most consistent with neuropathy type pain for which the patient takes amitriptyline.  In September she was evaluated in the emergency department and received 60 mg of Toradol IM and is requesting the same today.  Patient's labs and chest x-ray from September were reviewed.  With mostly chronic symptoms I do not feel this warrants repeating here today. Patient is to call PCP in the AM for conversation regarding her pain mgmt at home and schedule an ASAP appointment.   At this time, I do not feel there is any life-threatening condition present. I have reviewed and discussed all results (EKG, imaging, lab, urine as appropriate), exam findings with patient. I have reviewed nursing notes and appropriate previous records.  I feel the patient is safe to be discharged home without further emergent workup. Discussed usual and customary return precautions. Patient and family (if present) verbalize understanding and are comfortable with this plan.  Patient will follow-up with their primary care provider. If they do not have a primary care provider, information for follow-up has been provided to them. All questions have been answered.  ____________________________________________  FINAL CLINICAL IMPRESSION(S) / ED DIAGNOSES  Final diagnoses:    Bilateral foot pain     MEDICATIONS GIVEN DURING THIS VISIT:  Medications  ketorolac (TORADOL) 30 MG/ML injection 60 mg (has no administration in time range)    Note:  This document was prepared using Dragon voice recognition software and may include unintentional dictation errors.  Alona Bene, MD Emergency Medicine    Jill Stopka, Arlyss Repress, MD 12/22/17 [phone]

## 2017-12-22 NOTE — Discharge Instructions (Signed)
You were seen in the ED today with foot pain. I suspect that this is related to your chronic foot pain. Call your PCP tomorrow to discuss any medication changes that may be appropriate to treat your symptoms. Return to the ED with any fever, chills, rash, or other concerning symptoms.

## 2017-12-22 NOTE — ED Triage Notes (Signed)
States," I need a shot for my feet pain" Ongoing pain and swelling to feet for past 6 years

## 2018-01-26 ENCOUNTER — Emergency Department (HOSPITAL_BASED_OUTPATIENT_CLINIC_OR_DEPARTMENT_OTHER)
Admission: EM | Admit: 2018-01-26 | Discharge: 2018-01-26 | Disposition: A | Discharge status: Home / Self Care | Payer: 59 | Attending: Emergency Medicine | Admitting: Emergency Medicine

## 2018-01-26 ENCOUNTER — Other Ambulatory Visit: Payer: Self-pay

## 2018-01-26 ENCOUNTER — Encounter (HOSPITAL_BASED_OUTPATIENT_CLINIC_OR_DEPARTMENT_OTHER): Payer: Self-pay | Admitting: Emergency Medicine

## 2018-01-26 DIAGNOSIS — Z79899 Other long term (current) drug therapy: Secondary | ICD-10-CM | POA: Diagnosis not present

## 2018-01-26 DIAGNOSIS — M79642 Pain in left hand: Secondary | ICD-10-CM

## 2018-01-26 DIAGNOSIS — Z7982 Long term (current) use of aspirin: Secondary | ICD-10-CM | POA: Diagnosis not present

## 2018-01-26 DIAGNOSIS — Z8673 Personal history of transient ischemic attack (TIA), and cerebral infarction without residual deficits: Secondary | ICD-10-CM | POA: Diagnosis not present

## 2018-01-26 DIAGNOSIS — J45909 Unspecified asthma, uncomplicated: Secondary | ICD-10-CM | POA: Diagnosis not present

## 2018-01-26 DIAGNOSIS — Z91040 Latex allergy status: Secondary | ICD-10-CM | POA: Insufficient documentation

## 2018-01-26 DIAGNOSIS — M25542 Pain in joints of left hand: Secondary | ICD-10-CM | POA: Insufficient documentation

## 2018-01-26 DIAGNOSIS — M25541 Pain in joints of right hand: Secondary | ICD-10-CM | POA: Diagnosis present

## 2018-01-26 DIAGNOSIS — I110 Hypertensive heart disease with heart failure: Secondary | ICD-10-CM | POA: Diagnosis not present

## 2018-01-26 DIAGNOSIS — I5032 Chronic diastolic (congestive) heart failure: Secondary | ICD-10-CM | POA: Diagnosis not present

## 2018-01-26 DIAGNOSIS — M79641 Pain in right hand: Secondary | ICD-10-CM

## 2018-01-26 LAB — BASIC METABOLIC PANEL
ANION GAP: 8 (ref 5–15)
BUN: 10 mg/dL (ref 6–20)
CHLORIDE: 100 mmol/L (ref 98–111)
CO2: 28 mmol/L (ref 22–32)
Calcium: 9.1 mg/dL (ref 8.9–10.3)
Creatinine, Ser: 0.87 mg/dL (ref 0.44–1.00)
GFR calc Af Amer: >60 mL/min (ref >60)
GFR calc non Af Amer: >60 mL/min (ref >60)
Glucose, Bld: 90 mg/dL (ref 70–99)
POTASSIUM: 4.1 mmol/L (ref 3.5–5.1)
Sodium: 136 mmol/L (ref 135–145)

## 2018-01-26 LAB — CBC
HCT: 40.6 % (ref 36.0–46.0)
HEMOGLOBIN: 12.3 g/dL (ref 12.0–15.0)
MCH: 28.7 pg (ref 26.0–34.0)
MCHC: 30.3 g/dL (ref 30.0–36.0)
MCV: 94.6 fL (ref 80.0–100.0)
NRBC: 0 % (ref 0.0–0.2)
Platelets: 246 10*3/uL (ref 150–400)
RBC: 4.29 MIL/uL (ref 3.87–5.11)
RDW: 14.2 % (ref 11.5–15.5)
WBC: 5.8 10*3/uL (ref 4.0–10.5)

## 2018-01-26 MED ORDER — KETOROLAC TROMETHAMINE 30 MG/ML IJ SOLN
30.0000 mg | Freq: Once | INTRAMUSCULAR | Status: AC
  Administered 2018-01-26: 30 mg via INTRAMUSCULAR
  Filled 2018-01-26: qty 1

## 2018-01-26 MED ORDER — PREDNISONE 20 MG PO TABS: 40.0000 mg | ORAL_TABLET | Freq: Every day | ORAL | 0 refills | Status: DC

## 2018-01-26 NOTE — ED Triage Notes (Signed)
Bilateral hand and feet pain and swelling x 1 week.

## 2018-01-26 NOTE — Discharge Instructions (Addendum)
Watch for further swelling.  You may need more fluid pills.  Follow-up with your doctor and the pain management as planned.

## 2018-01-26 NOTE — ED Provider Notes (Signed)
[facility] EMERGENCY DEPARTMENT Provider Note   CSN: [id number] Arrival date & time: 01/26/18  1050     History   Chief Complaint Chief Complaint  Patient presents with  . Joint Swelling    HPI Melinda Woods is a 52 y.o. female.  HPI Patient presents with pain and swelling in her hands and feet.  Has been going for the last week.  States she has chronic pain in both her hands in her feet and recently had her amitriptyline decreased.  However now her hands hurt and are swollen.  States he normally are not swollen.  No difficulty breathing.  The pain is worse in the left hand goes to the right.  History of fibromyalgia and rheumatoid arthritis.  Patient states that she normally gets a pain shot and she will feel better.  States that she is due to get seen in the pain clinic on the 20th. Past Medical History:  Diagnosis Date  . Anxiety   . Asthma   . CHF (congestive heart failure) (HCC)    grade 1 dd on echo 03/2016  . Fatty liver   . Fibromyalgia   . Hypertension   . Neuropathy   . Ovarian cyst   . RA (rheumatoid arthritis) [facility])     Patient Active Problem List   Diagnosis Date Noted  . Musculoskeletal chest pain 08/26/2017  . Asthma exacerbation 04/13/2017  . Asthma exacerbation attacks 04/13/2017  . Hypokalemia 04/13/2017  . Acute bronchitis 01/29/2017  . Depression 01/29/2017  . SIRS (systemic inflammatory response syndrome) (HCC) 01/29/2017  . RA (rheumatoid arthritis) (HCC)   . Acute respiratory failure (HCC) 04/26/2016  . Fatty liver 04/25/2016  . Hypertension 04/23/2016  . Chronic diastolic CHF (congestive heart failure) (HCC) 04/23/2016  . Morbid obesity (HCC) 04/23/2016  . Peripheral neuropathy 04/23/2016  . Right sided weakness 03/13/2014  . TIA (transient ischemic attack) 03/13/2014    Past Surgical History:  Procedure Laterality Date  . TUBAL LIGATION       OB History   None      Home Medications    Prior to Admission  medications   Medication Sig Start Date End Date Taking? Authorizing Provider  acetaminophen (TYLENOL) 650 MG CR tablet Take 1,300 mg by mouth daily as needed for pain.    [provider]  albuterol (PROVENTIL HFA;VENTOLIN HFA) 108 (90 Base) MCG/ACT inhaler Inhale 1-2 puffs into the lungs every 6 (six) hours as needed for wheezing or shortness of breath.    [provider]  amitriptyline (ELAVIL) 100 MG tablet Take 1 tablet (100 mg total) by mouth 2 (two) times daily. 04/26/16   Calvert Cantor, MD  aspirin EC 81 MG tablet Take 1 tablet (81 mg total) by mouth daily. 08/26/17   Delano Metz, MD  dextromethorphan-guaiFENesin [facility] DM) 30-600 MG 12hr tablet Take 1 tablet by mouth 2 (two) times daily. 02/02/17   Kathlen Mody, MD  DULoxetine (CYMBALTA) 60 MG capsule Take 60 mg by mouth daily. 08/16/17   [provider]  furosemide (LASIX) 40 MG tablet Take 1 tablet (40 mg total) by mouth daily. 08/27/17   Delano Metz, MD  hydrOXYzine (ATARAX/VISTARIL) 25 MG tablet Take 2 tablets (50 mg total) by mouth every 6 (six) hours as needed for anxiety. 02/22/17   Alvira Monday, MD  ipratropium-albuterol (DUONEB) 0.5-2.5 (3) MG/3ML SOLN Take 3 mLs by nebulization 2 (two) times daily. Patient taking differently: Take 3 mLs by nebulization 2 (two) times daily as needed (shortness  of breath/wheezing).  02/02/17   Kathlen Mody, MD  losartan (COZAAR) 25 MG tablet Take 1 tablet (25 mg total) by mouth daily. 04/26/16   Calvert Cantor, MD  metoprolol tartrate (LOPRESSOR) 25 MG tablet Take 1 tablet (25 mg total) by mouth 2 (two) times daily. 04/26/16   Calvert Cantor, MD  montelukast (SINGULAIR) 10 MG tablet Take 10 mg by mouth daily. 08/22/17   [provider]  montelukast (SINGULAIR) 10 MG tablet Take 1 tablet (10 mg total) by mouth daily. 08/26/17 08/26/18  Delano Metz, MD  polyethylene glycol (MIRALAX / Ethelene Hal) packet Take 17 g by mouth daily as needed for mild constipation. Patient  taking differently: Take 17 g by mouth daily as needed for mild constipation. For constipation 04/26/16   Calvert Cantor, MD  potassium chloride SA (K-DUR,KLOR-CON) 20 MEQ tablet Take 1 tablet (20 mEq total) by mouth daily as needed (always take with the Furosemide). 04/26/16   Calvert Cantor, MD  predniSONE (DELTASONE) 20 MG tablet Take 2 tablets (40 mg total) by mouth daily. 01/26/18   Benjiman Core, MD    Family History Family History  Problem Relation Age of Onset  . Stroke Mother   . Diabetes Mellitus II Mother   . Lung cancer Father     Social History Social History   Tobacco Use  . Smoking status: Never Smoker  . Smokeless tobacco: Never Used  Substance Use Topics  . Alcohol use: No  . Drug use: No     Allergies   Latex   Review of Systems Review of Systems  Constitutional: Negative for appetite change.  HENT: Negative for congestion.   Respiratory: Negative for shortness of breath.   Cardiovascular: Positive for leg swelling.  Gastrointestinal: Negative for abdominal pain.  Genitourinary: Negative for flank pain.  Musculoskeletal: Positive for joint swelling.  Skin: Negative for rash.  Neurological: Negative for weakness.  Psychiatric/Behavioral: Negative for confusion.     Physical Exam Updated Vital Signs BP (!) 116/94 (BP Location: Right Arm)   Pulse (!) 116   Temp 98.4 F (36.9 C) (Oral)   Resp 18   Ht 5\' 6"  (1.676 m)   Wt 121.6 kg   LMP 01/21/2018   SpO2 92%   BMI 43.26 kg/m   Physical Exam  Constitutional: She appears well-developed.  HENT:  Head: Atraumatic.  Eyes: Pupils are equal, round, and reactive to light.  Neck: Neck supple.  Cardiovascular:  Mild tachycardia  Pulmonary/Chest: She has no wheezes. She has no rales.  Abdominal: There is no tenderness.  Musculoskeletal: She exhibits edema.  Some swelling of bilateral hands and bilateral feet.  Tenderness over it.  No erythema.  No fluctuance.  Does not appear to be joint  involvement.  Neurological: She is alert.  Skin: Skin is warm.     ED Treatments / Results  Labs (all labs ordered are listed, but only abnormal results are displayed) Labs Reviewed  BASIC METABOLIC PANEL  CBC    EKG EKG Interpretation  Date/Time:  Sunday January 26 2018 11:57:55 EST Ventricular Rate:  100 PR Interval:    QRS Duration: 89 QT Interval:  339 QTC Calculation: 438 R Axis:   57 Text Interpretation:  Sinus tachycardia Low voltage, precordial leads Confirmed by 01-06-1975 [id number]) on 01/26/2018 1:06:40 PM   Radiology No results found.  Procedures Procedures (including critical care time)  Medications Ordered in ED Medications  ketorolac (TORADOL) 30 MG/ML injection 30 mg (has no administration in time range)  Initial Impression / Assessment and Plan / ED Course  I have reviewed the triage vital signs and the nursing notes.  Pertinent labs & imaging results that were available during my care of the patient were reviewed by me and considered in my medical decision making (see chart for details).     Patient presents with pain and swelling both her hands and feet.  Is been going for the last week.  On fluid pills but also has rheumatoid arthritis and chronic pain with fibromyalgia.  Lab work reassuring.  Does not appear to be in CHF.  Will give short course of steroids.  Shot of Toradol here.  Reviewed records including CHF.  Discharge home.  Follow-up as an outpatient.  Final Clinical Impressions(s) / ED Diagnoses   Final diagnoses:  Bilateral hand pain    ED Discharge Orders         Ordered    predniSONE (DELTASONE) 20 MG tablet  Daily     01/26/18 1305           Benjiman Core, MD 01/26/18 1307

## 2018-01-26 NOTE — ED Notes (Signed)
ED Provider at bedside. 

## 2018-01-26 NOTE — ED Notes (Signed)
Pt on cardiac monitor and auto VS 

## 2018-04-14 DIAGNOSIS — Z5321 Procedure and treatment not carried out due to patient leaving prior to being seen by health care provider: Secondary | ICD-10-CM | POA: Diagnosis not present

## 2018-04-14 DIAGNOSIS — M79606 Pain in leg, unspecified: Secondary | ICD-10-CM | POA: Insufficient documentation

## 2018-04-15 ENCOUNTER — Encounter (HOSPITAL_BASED_OUTPATIENT_CLINIC_OR_DEPARTMENT_OTHER): Payer: Self-pay | Admitting: *Deleted

## 2018-04-15 ENCOUNTER — Other Ambulatory Visit: Payer: Self-pay

## 2018-04-15 ENCOUNTER — Emergency Department (HOSPITAL_BASED_OUTPATIENT_CLINIC_OR_DEPARTMENT_OTHER)
Admission: EM | Admit: 2018-04-15 | Discharge: 2018-04-15 | Disposition: A | Discharge status: Home / Self Care | Payer: 59 | Attending: Emergency Medicine | Admitting: Emergency Medicine

## 2018-04-15 NOTE — ED Notes (Signed)
Pt stated she needed to leave due to her husband having to be at work

## 2018-04-15 NOTE — ED Triage Notes (Signed)
Pt reports sciatica and leg pain x 7 years. Reports worsening today. States that she has a hx of neuropathy and feels like it is worse today also. Swelling in bilateral feet that is chronic.  Ambulatory into dept slowly but without difficulty.

## 2018-10-19 ENCOUNTER — Emergency Department (HOSPITAL_BASED_OUTPATIENT_CLINIC_OR_DEPARTMENT_OTHER)
Admission: EM | Admit: 2018-10-19 | Discharge: 2018-10-19 | Disposition: A | Discharge status: Home / Self Care | Payer: 59 | Attending: Emergency Medicine | Admitting: Emergency Medicine

## 2018-10-19 ENCOUNTER — Encounter (HOSPITAL_BASED_OUTPATIENT_CLINIC_OR_DEPARTMENT_OTHER): Payer: Self-pay | Admitting: Emergency Medicine

## 2018-10-19 ENCOUNTER — Emergency Department (HOSPITAL_BASED_OUTPATIENT_CLINIC_OR_DEPARTMENT_OTHER): Payer: 59

## 2018-10-19 ENCOUNTER — Other Ambulatory Visit: Payer: Self-pay

## 2018-10-19 DIAGNOSIS — J441 Chronic obstructive pulmonary disease with (acute) exacerbation: Secondary | ICD-10-CM | POA: Insufficient documentation

## 2018-10-19 DIAGNOSIS — M797 Fibromyalgia: Secondary | ICD-10-CM | POA: Insufficient documentation

## 2018-10-19 DIAGNOSIS — I110 Hypertensive heart disease with heart failure: Secondary | ICD-10-CM | POA: Insufficient documentation

## 2018-10-19 DIAGNOSIS — Z8673 Personal history of transient ischemic attack (TIA), and cerebral infarction without residual deficits: Secondary | ICD-10-CM | POA: Insufficient documentation

## 2018-10-19 DIAGNOSIS — Z79899 Other long term (current) drug therapy: Secondary | ICD-10-CM | POA: Insufficient documentation

## 2018-10-19 DIAGNOSIS — Z91040 Latex allergy status: Secondary | ICD-10-CM | POA: Diagnosis not present

## 2018-10-19 DIAGNOSIS — R531 Weakness: Secondary | ICD-10-CM | POA: Diagnosis not present

## 2018-10-19 DIAGNOSIS — R0602 Shortness of breath: Secondary | ICD-10-CM | POA: Diagnosis present

## 2018-10-19 DIAGNOSIS — Z7982 Long term (current) use of aspirin: Secondary | ICD-10-CM | POA: Insufficient documentation

## 2018-10-19 DIAGNOSIS — M7918 Myalgia, other site: Secondary | ICD-10-CM | POA: Diagnosis not present

## 2018-10-19 DIAGNOSIS — M069 Rheumatoid arthritis, unspecified: Secondary | ICD-10-CM | POA: Insufficient documentation

## 2018-10-19 DIAGNOSIS — Z20828 Contact with and (suspected) exposure to other viral communicable diseases: Secondary | ICD-10-CM | POA: Insufficient documentation

## 2018-10-19 DIAGNOSIS — I5032 Chronic diastolic (congestive) heart failure: Secondary | ICD-10-CM | POA: Diagnosis not present

## 2018-10-19 LAB — CBC WITH DIFFERENTIAL/PLATELET
Abs Immature Granulocytes: 0.01 10*3/uL (ref 0.00–0.07)
Basophils Absolute: 0 10*3/uL (ref 0.0–0.1)
Basophils Relative: 0 %
Eosinophils Absolute: 0.2 10*3/uL (ref 0.0–0.5)
Eosinophils Relative: 2 %
HCT: 40.5 % (ref 36.0–46.0)
Hemoglobin: 12.5 g/dL (ref 12.0–15.0)
Immature Granulocytes: 0 %
Lymphocytes Relative: 39 %
Lymphs Abs: 2.6 10*3/uL (ref 0.7–4.0)
MCH: 29.1 pg (ref 26.0–34.0)
MCHC: 30.9 g/dL (ref 30.0–36.0)
MCV: 94.2 fL (ref 80.0–100.0)
Monocytes Absolute: 0.5 10*3/uL (ref 0.1–1.0)
Monocytes Relative: 8 %
Neutro Abs: 3.3 10*3/uL (ref 1.7–7.7)
Neutrophils Relative %: 51 %
Platelets: 212 10*3/uL (ref 150–400)
RBC: 4.3 MIL/uL (ref 3.87–5.11)
RDW: 13.6 % (ref 11.5–15.5)
WBC: 6.5 10*3/uL (ref 4.0–10.5)
nRBC: 0 % (ref 0.0–0.2)

## 2018-10-19 LAB — BASIC METABOLIC PANEL
Anion gap: 9 (ref 5–15)
BUN: 11 mg/dL (ref 6–20)
CO2: 31 mmol/L (ref 22–32)
Calcium: 9.3 mg/dL (ref 8.9–10.3)
Chloride: 99 mmol/L (ref 98–111)
Creatinine, Ser: 0.92 mg/dL (ref 0.44–1.00)
GFR calc Af Amer: >60 mL/min (ref >60)
GFR calc non Af Amer: >60 mL/min (ref >60)
Glucose, Bld: 112 mg/dL — ABNORMAL HIGH (ref 70–99)
Potassium: 3.5 mmol/L (ref 3.5–5.1)
Sodium: 139 mmol/L (ref 135–145)

## 2018-10-19 LAB — BRAIN NATRIURETIC PEPTIDE: B Natriuretic Peptide: 20.4 pg/mL (ref 0.0–100.0)

## 2018-10-19 LAB — SARS CORONAVIRUS 2 BY RT PCR (HOSPITAL ORDER, PERFORMED IN ~~LOC~~ HOSPITAL LAB): SARS Coronavirus 2: NEGATIVE

## 2018-10-19 LAB — PROTIME-INR
INR: 1 (ref 0.8–1.2)
Prothrombin Time: 13.2 seconds (ref 11.4–15.2)

## 2018-10-19 LAB — D-DIMER, QUANTITATIVE: D-Dimer, Quant: 0.39 ug/mL-FEU (ref 0.00–0.50)

## 2018-10-19 LAB — HCG, SERUM, QUALITATIVE: Preg, Serum: NEGATIVE

## 2018-10-19 MED ORDER — AZITHROMYCIN 250 MG PO TABS: 250.0000 mg | ORAL_TABLET | Freq: Every day | ORAL | 0 refills | Status: DC

## 2018-10-19 MED ORDER — ALBUTEROL SULFATE HFA 108 (90 BASE) MCG/ACT IN AERS
4.0000 | INHALATION_SPRAY | Freq: Once | RESPIRATORY_TRACT | Status: AC
  Administered 2018-10-19: 4 via RESPIRATORY_TRACT
  Filled 2018-10-19: qty 6.7

## 2018-10-19 MED ORDER — ALBUTEROL SULFATE HFA 108 (90 BASE) MCG/ACT IN AERS
4.0000 | INHALATION_SPRAY | Freq: Once | RESPIRATORY_TRACT | Status: AC
  Administered 2018-10-19: 4 via RESPIRATORY_TRACT

## 2018-10-19 MED ORDER — DEXAMETHASONE SODIUM PHOSPHATE 10 MG/ML IJ SOLN
10.0000 mg | Freq: Once | INTRAMUSCULAR | Status: AC
  Administered 2018-10-19: 10 mg via INTRAVENOUS
  Filled 2018-10-19: qty 1

## 2018-10-19 MED ORDER — PREDNISONE 10 MG PO TABS: 50.0000 mg | ORAL_TABLET | Freq: Every day | ORAL | 0 refills | Status: DC

## 2018-10-19 MED ORDER — ALBUTEROL SULFATE HFA 108 (90 BASE) MCG/ACT IN AERS: 2.0000 | INHALATION_SPRAY | RESPIRATORY_TRACT | 1 refills | Status: AC | PRN

## 2018-10-19 NOTE — ED Notes (Signed)
RT assessed patient upon arrival. COPD history. Stated she took neb at home. SOB with exertion walking back. BBS clear. SAT 92-95%

## 2018-10-19 NOTE — ED Provider Notes (Addendum)
[facility] EMERGENCY DEPARTMENT Provider Note   CSN: [id number] Arrival date & time: 10/19/18  0700     History   Chief Complaint Chief Complaint  Patient presents with  . Shortness of Breath    HPI Melinda Woods is a 53 y.o. female.     HPI  53 year old female with history of CHF, rheumatoid arthritis, COPD/asthma comes in with chief complaint of shortness of breath, weakness and body aches.  She also reports that she has history of neuropathy and fibromyalgia.  Patient started feeling unwell about 2 days ago.  She started having body aches and feeling weak.  She has been having worsening shortness of breath, she now feels short of breath with minimal exertion.  There is no associated chest pain, but patient does have a cough that is producing dark phlegm.  Patient denies any fevers, chills.  She also denies any sick contacts.  Symptoms are not worse with laying flat.  Past Medical History:  Diagnosis Date  . Anxiety   . Asthma   . CHF (congestive heart failure) (HCC)    grade 1 dd on echo 03/2016  . Fatty liver   . Fibromyalgia   . Hypertension   . Neuropathy   . Ovarian cyst   . RA (rheumatoid arthritis) [facility](HCC)     Patient Active Problem List   Diagnosis Date Noted  . Musculoskeletal chest pain 08/26/2017  . Asthma exacerbation 04/13/2017  . Asthma exacerbation attacks 04/13/2017  . Hypokalemia 04/13/2017  . Acute bronchitis 01/29/2017  . Depression 01/29/2017  . SIRS (systemic inflammatory response syndrome) (HCC) 01/29/2017  . RA (rheumatoid arthritis) (HCC)   . Acute respiratory failure (HCC) 04/26/2016  . Fatty liver 04/25/2016  . Hypertension 04/23/2016  . Chronic diastolic CHF (congestive heart failure) (HCC) 04/23/2016  . Morbid obesity (HCC) 04/23/2016  . Peripheral neuropathy 04/23/2016  . Right sided weakness 03/13/2014  . TIA (transient ischemic attack) 03/13/2014    Past Surgical History:  Procedure Laterality Date  . TUBAL  LIGATION       OB History   No obstetric history on file.      Home Medications    Prior to Admission medications   Medication Sig Start Date End Date Taking? Authorizing Provider  amitriptyline (ELAVIL) 100 MG tablet Take 1 tablet (100 mg total) by mouth 2 (two) times daily. 04/26/16  Yes Calvert Cantorizwan, Saima, MD  DULoxetine (CYMBALTA) 60 MG capsule Take 60 mg by mouth daily. 08/16/17  Yes [provider]  furosemide (LASIX) 40 MG tablet Take 1 tablet (40 mg total) by mouth daily. 08/27/17  Yes Delano MetzSchertz, Robert, MD  losartan (COZAAR) 25 MG tablet Take 1 tablet (25 mg total) by mouth daily. 04/26/16  Yes Calvert Cantorizwan, Saima, MD  metoprolol tartrate (LOPRESSOR) 25 MG tablet Take 1 tablet (25 mg total) by mouth 2 (two) times daily. 04/26/16  Yes Calvert Cantorizwan, Saima, MD  acetaminophen (TYLENOL) 650 MG CR tablet Take 1,300 mg by mouth daily as needed for pain.    [provider]  albuterol (PROVENTIL HFA;VENTOLIN HFA) 108 (90 Base) MCG/ACT inhaler Inhale 1-2 puffs into the lungs every 6 (six) hours as needed for wheezing or shortness of breath.    [provider]  aspirin EC 81 MG tablet Take 1 tablet (81 mg total) by mouth daily. 08/26/17   Delano MetzSchertz, Robert, MD  dextromethorphan-guaiFENesin [facility](MUCINEX DM) 30-600 MG 12hr tablet Take 1 tablet by mouth 2 (two) times daily. 02/02/17   Kathlen ModyAkula, Vijaya, MD  hydrOXYzine (ATARAX/VISTARIL)  25 MG tablet Take 2 tablets (50 mg total) by mouth every 6 (six) hours as needed for anxiety. 02/22/17   Gareth Morgan, MD  ipratropium-albuterol (DUONEB) 0.5-2.5 (3) MG/3ML SOLN Take 3 mLs by nebulization 2 (two) times daily. Patient taking differently: Take 3 mLs by nebulization 2 (two) times daily as needed (shortness of breath/wheezing).  02/02/17   Hosie Poisson, MD  montelukast (SINGULAIR) 10 MG tablet Take 10 mg by mouth daily. 08/22/17   [provider]  montelukast (SINGULAIR) 10 MG tablet Take 1 tablet (10 mg total) by mouth daily. 08/26/17 08/26/18  Roney Jaffe, MD  polyethylene glycol (MIRALAX / Floria Raveling) packet Take 17 g by mouth daily as needed for mild constipation. Patient taking differently: Take 17 g by mouth daily as needed for mild constipation. For constipation 04/26/16   Debbe Odea, MD  potassium chloride SA (K-DUR,KLOR-CON) 20 MEQ tablet Take 1 tablet (20 mEq total) by mouth daily as needed (always take with the Furosemide). 04/26/16   Debbe Odea, MD  predniSONE (DELTASONE) 20 MG tablet Take 2 tablets (40 mg total) by mouth daily. 01/26/18   Davonna Belling, MD    Family History Family History  Problem Relation Age of Onset  . Stroke Mother   . Diabetes Mellitus II Mother   . Lung cancer Father     Social History Social History   Tobacco Use  . Smoking status: Never Smoker  . Smokeless tobacco: Never Used  Substance Use Topics  . Alcohol use: No  . Drug use: No     Allergies   Latex   Review of Systems Review of Systems  Constitutional: Positive for activity change.  Respiratory: Positive for shortness of breath.   All other systems reviewed and are negative.    Physical Exam Updated Vital Signs BP 131/65   Pulse 91   Temp 98.7 F (37.1 C) (Oral)   Resp 17   Ht 5\' 7"  (1.702 m)   Wt 121.6 kg   SpO2 (!) 89%   BMI 41.97 kg/m   Physical Exam Vitals signs and nursing note reviewed.  Constitutional:      Appearance: She is well-developed.  HENT:     Head: Normocephalic and atraumatic.  Neck:     Musculoskeletal: Normal range of motion and neck supple.  Cardiovascular:     Rate and Rhythm: Normal rate.  Pulmonary:     Effort: Pulmonary effort is normal. Tachypnea present. No accessory muscle usage or respiratory distress.     Breath sounds: Examination of the right-lower field reveals rales. Examination of the left-lower field reveals rales. Rales present. No decreased breath sounds, wheezing or rhonchi.  Abdominal:     General: Bowel sounds are normal.  Musculoskeletal:     Right lower leg:  No edema.     Left lower leg: No edema.  Skin:    General: Skin is warm and dry.  Neurological:     Mental Status: She is alert and oriented to person, place, and time.      ED Treatments / Results  Labs (all labs ordered are listed, but only abnormal results are displayed) Labs Reviewed  BASIC METABOLIC PANEL - Abnormal; Notable for the following components:      Result Value   Glucose, Bld 112 (*)    All other components within normal limits  SARS CORONAVIRUS 2 (HOSPITAL ORDER, [place] [facility])  CBC WITH DIFFERENTIAL/PLATELET  PROTIME-INR  BRAIN NATRIURETIC PEPTIDE  D-DIMER, QUANTITATIVE (NOT AT  [facility])  HCG, SERUM, QUALITATIVE    EKG EKG Interpretation  Date/Time:  Sunday October 19 2018 07:42:37 EDT Ventricular Rate:  95 PR Interval:    QRS Duration: 91 QT Interval:  354 QTC Calculation: 445 R Axis:   59 Text Interpretation:  Sinus rhythm Low voltage, precordial leads No acute changes No significant change since last tracing Confirmed by ,  ([id number]) on 10/19/2018 7:56:05 AM   Radiology Dg Chest Port 1 View  Result Date: 10/19/2018 CLINICAL DATA:  Per MD notes: Patient started feeling unwell about 2 days ago. She started having body aches and feeling weak. She has been having worsening shortness of breath, she now feels short of breath with minimal exertion. EXAM: PORTABLE CHEST 1 VIEW COMPARISON:  08/25/2017 FINDINGS: The heart size and mediastinal contours are within normal limits. Both lungs are clear. The visualized skeletal structures are unremarkable. IMPRESSION: No active disease. Electronically Signed   By: Elizabeth  Brown M.D.   On: 10/19/2018 08:39    Procedures Procedures (including critical care time)  Medications Ordered in ED Medications  dexamethasone (DECADRON) injection 10 mg (has no administration in time range)  albuterol (VENTOLIN HFA) 108 (90 Base) MCG/ACT inhaler 4 puff (4 puffs Inhalation Given 10/19/18 0736)   albuterol (VENTOLIN HFA) 108 (90 Base) MCG/ACT inhaler 4 puff (4 puffs Inhalation Given 10/19/18 0928)     Initial Impression / Assessment and Plan / ED Course  I have reviewed the triage vital signs and the nursing notes.  Pertinent labs & imaging results that were available during my care of the patient were reviewed by me and considered in my medical decision making (see chart for details).  Clinical Course as of Oct 19 927  Sun Oct 19, 2018  0856 O2 sats at 88% noted on room air by RN.  Oxygen ordered if needed - however, O2 improved on its own.  SpO2(!): 88 % [AN]  0924 Patient reassessed.  All of her labs are normal.  D-dimer, BNP and chest x-ray did not show any abnormalities.  COVID-19 test is also negative.  Her O2 sats have fluctuated.  At the time of my assessment however her O2 sats were 93% on room air.  The O2 sats fluctuated between 90 to 95%.   Patient informed me that she felt better after she got the inhaler treatment here.  She has nebulizer at home but not an inhaler.  She is comfortable going home and there is someone at home with her all the time.  Plan is for patient to be treated as COPD exacerbation.  She will get IV Decadron here along with prescription for azithromycin for COPD exacerbation.  I informed her that the test could be a false negative therefore she should continue isolation at home, until she has 3 days of feeling well.  Strict ER return precautions have been discussed, and patient is agreeing with the plan and is comfortable with the workup done and the recommendations from the ER.    [AN]    Clinical Course User Index [AN] , , MD       53  year old comes in a chief complaint of shortness of breath, weakness, body aches.  She has history of COPD-asthma, CHF, fibromyalgia.  Symptoms are nonspecific, but she does have exertional shortness of breath with productive cough.  No fevers, chills.  No known sick exposures.  Clinical  concerns are for COVID-19 pneumonia along with COPD and CHF exacerbation.  No chest pain, doubt ACS but we will  get a screening EKG.  Melinda Woods was evaluated in Emergency Department on 10/19/2018 for the symptoms described in the history of present illness. She was evaluated in the context of the global COVID-19 pandemic, which necessitated consideration that the patient might be at risk for infection with the SARS-CoV-2 virus that causes COVID-19. Institutional protocols and algorithms that pertain to the evaluation of patients at risk for COVID-19 are in a state of rapid change based on information released by regulatory bodies including the CDC and federal and state organizations. These policies and algorithms were followed during the patient's care in the ED.   Final Clinical Impressions(s) / ED Diagnoses   Final diagnoses:  COPD exacerbation [facility])    ED Discharge Orders    None       Derwood Kaplan, MD 10/19/18 6195    Derwood Kaplan, MD 10/19/18 [phone]

## 2018-10-19 NOTE — ED Notes (Signed)
Given po fluids 

## 2018-10-19 NOTE — ED Notes (Signed)
XR at bedside for CXR.

## 2018-10-19 NOTE — ED Triage Notes (Signed)
SOB with generalized pain since Friday. Using nebs without relief.

## 2018-10-19 NOTE — ED Notes (Signed)
ED Provider at bedside. 

## 2018-10-19 NOTE — Discharge Instructions (Signed)
We signed the ER for shortness of breath, weakness and body aches.  Work-up in the ER did not show any particular abnormality.  COVID-19 test results are negative as well.  We suspect that you have COPD exacerbation.  Take the medication as prescribed.  Also, given that there is a small possibility of a falsely negative COVID-19 test, we recommend continued self-isolation at home and use of mask when around people until at least 3 days of feeling well.  We would like you to return to the ER if you start having worsening shortness of breath, feeling like he might faint, start developing fevers, chills or severe nausea.

## 2018-11-18 ENCOUNTER — Other Ambulatory Visit: Payer: Self-pay

## 2018-11-18 ENCOUNTER — Emergency Department (HOSPITAL_BASED_OUTPATIENT_CLINIC_OR_DEPARTMENT_OTHER): Payer: 59

## 2018-11-18 ENCOUNTER — Emergency Department (HOSPITAL_BASED_OUTPATIENT_CLINIC_OR_DEPARTMENT_OTHER)
Admission: EM | Admit: 2018-11-18 | Discharge: 2018-11-18 | Disposition: A | Discharge status: Home / Self Care | Payer: 59 | Attending: Emergency Medicine | Admitting: Emergency Medicine

## 2018-11-18 ENCOUNTER — Encounter (HOSPITAL_BASED_OUTPATIENT_CLINIC_OR_DEPARTMENT_OTHER): Payer: Self-pay | Admitting: *Deleted

## 2018-11-18 DIAGNOSIS — R0602 Shortness of breath: Secondary | ICD-10-CM

## 2018-11-18 DIAGNOSIS — Z7982 Long term (current) use of aspirin: Secondary | ICD-10-CM | POA: Insufficient documentation

## 2018-11-18 DIAGNOSIS — I110 Hypertensive heart disease with heart failure: Secondary | ICD-10-CM | POA: Diagnosis not present

## 2018-11-18 DIAGNOSIS — R062 Wheezing: Secondary | ICD-10-CM | POA: Diagnosis present

## 2018-11-18 DIAGNOSIS — Z79899 Other long term (current) drug therapy: Secondary | ICD-10-CM | POA: Insufficient documentation

## 2018-11-18 DIAGNOSIS — K625 Hemorrhage of anus and rectum: Secondary | ICD-10-CM | POA: Insufficient documentation

## 2018-11-18 DIAGNOSIS — I5032 Chronic diastolic (congestive) heart failure: Secondary | ICD-10-CM | POA: Diagnosis not present

## 2018-11-18 DIAGNOSIS — R2243 Localized swelling, mass and lump, lower limb, bilateral: Secondary | ICD-10-CM | POA: Diagnosis not present

## 2018-11-18 DIAGNOSIS — Z91040 Latex allergy status: Secondary | ICD-10-CM | POA: Insufficient documentation

## 2018-11-18 DIAGNOSIS — J45909 Unspecified asthma, uncomplicated: Secondary | ICD-10-CM | POA: Insufficient documentation

## 2018-11-18 LAB — COMPREHENSIVE METABOLIC PANEL
ALT: 22 U/L (ref 0–44)
AST: 19 U/L (ref 15–41)
Albumin: 3.9 g/dL (ref 3.5–5.0)
Alkaline Phosphatase: 92 U/L (ref 38–126)
Anion gap: 12 (ref 5–15)
BUN: 13 mg/dL (ref 6–20)
CO2: 31 mmol/L (ref 22–32)
Calcium: 8.9 mg/dL (ref 8.9–10.3)
Chloride: 96 mmol/L — ABNORMAL LOW (ref 98–111)
Creatinine, Ser: 0.6 mg/dL (ref 0.44–1.00)
GFR calc Af Amer: >60 mL/min (ref >60)
GFR calc non Af Amer: >60 mL/min (ref >60)
Glucose, Bld: 86 mg/dL (ref 70–99)
Potassium: 3.3 mmol/L — ABNORMAL LOW (ref 3.5–5.1)
Sodium: 139 mmol/L (ref 135–145)
Total Bilirubin: 0.4 mg/dL (ref 0.3–1.2)
Total Protein: 7.9 g/dL (ref 6.5–8.1)

## 2018-11-18 LAB — OCCULT BLOOD X 1 CARD TO LAB, STOOL: Fecal Occult Bld: POSITIVE — AB

## 2018-11-18 LAB — URINALYSIS, ROUTINE W REFLEX MICROSCOPIC
Bilirubin Urine: NEGATIVE
Glucose, UA: NEGATIVE mg/dL
Hgb urine dipstick: NEGATIVE
Ketones, ur: NEGATIVE mg/dL
Leukocytes,Ua: NEGATIVE
Nitrite: NEGATIVE
Protein, ur: NEGATIVE mg/dL
Specific Gravity, Urine: 1.02 (ref 1.005–1.030)
pH: 6 (ref 5.0–8.0)

## 2018-11-18 LAB — CBC WITH DIFFERENTIAL/PLATELET
Abs Immature Granulocytes: 0.01 10*3/uL (ref 0.00–0.07)
Basophils Absolute: 0 10*3/uL (ref 0.0–0.1)
Basophils Relative: 0 %
Eosinophils Absolute: 0.2 10*3/uL (ref 0.0–0.5)
Eosinophils Relative: 3 %
HCT: 38.5 % (ref 36.0–46.0)
Hemoglobin: 11.9 g/dL — ABNORMAL LOW (ref 12.0–15.0)
Immature Granulocytes: 0 %
Lymphocytes Relative: 41 %
Lymphs Abs: 2.4 10*3/uL (ref 0.7–4.0)
MCH: 28.8 pg (ref 26.0–34.0)
MCHC: 30.9 g/dL (ref 30.0–36.0)
MCV: 93.2 fL (ref 80.0–100.0)
Monocytes Absolute: 0.4 10*3/uL (ref 0.1–1.0)
Monocytes Relative: 7 %
Neutro Abs: 2.9 10*3/uL (ref 1.7–7.7)
Neutrophils Relative %: 49 %
Platelets: 251 10*3/uL (ref 150–400)
RBC: 4.13 MIL/uL (ref 3.87–5.11)
RDW: 13.7 % (ref 11.5–15.5)
WBC: 5.9 10*3/uL (ref 4.0–10.5)
nRBC: 0 % (ref 0.0–0.2)

## 2018-11-18 LAB — BRAIN NATRIURETIC PEPTIDE: B Natriuretic Peptide: 17.7 pg/mL (ref 0.0–100.0)

## 2018-11-18 LAB — TROPONIN I (HIGH SENSITIVITY): Troponin I (High Sensitivity): <2 ng/L (ref <18)

## 2018-11-18 MED ORDER — ALBUTEROL SULFATE HFA 108 (90 BASE) MCG/ACT IN AERS
4.0000 | INHALATION_SPRAY | Freq: Once | RESPIRATORY_TRACT | Status: AC
  Administered 2018-11-18: 22:00:00 4 via RESPIRATORY_TRACT
  Filled 2018-11-18: qty 6.7

## 2018-11-18 MED ORDER — FUROSEMIDE 10 MG/ML IJ SOLN
20.0000 mg | Freq: Once | INTRAMUSCULAR | Status: AC
  Administered 2018-11-18: 21:00:00 20 mg via INTRAVENOUS
  Filled 2018-11-18: qty 2

## 2018-11-18 NOTE — ED Triage Notes (Signed)
Hx of CHF. She gained 18 pounds in 4 days. Feet at swollen. She feels sob. EKG at triage.

## 2018-11-18 NOTE — ED Provider Notes (Signed)
Patient seen by me along with the physician assistant.  Medical screening examination/treatment/procedure(s) were conducted as a shared visit with non-physician practitioner(s) and myself.  I personally evaluated the patient during the encounter.  EKG Interpretation  Date/Time:  Tuesday November 18 2018 17:47:12 EDT Ventricular Rate:  110 PR Interval:  134 QRS Duration: 80 QT Interval:  328 QTC Calculation: 443 R Axis:   52 Text Interpretation:  Sinus tachycardia Nonspecific T wave abnormality Abnormal ECG No significant change since last tracing Confirmed by Vanetta Mulders [phone]) on 11/18/2018 7:05:29 PM   Results for orders placed or performed during the hospital encounter of 11/18/18  Comprehensive metabolic panel  Result Value Ref Range   Sodium 139 135 - 145 mmol/L   Potassium 3.3 (L) 3.5 - 5.1 mmol/L   Chloride 96 (L) 98 - 111 mmol/L   CO2 31 22 - 32 mmol/L   Glucose, Bld 86 70 - 99 mg/dL   BUN 13 6 - 20 mg/dL   Creatinine, Ser 4.23 0.44 - 1.00 mg/dL   Calcium 8.9 8.9 - 53.6 mg/dL   Total Protein 7.9 6.5 - 8.1 g/dL   Albumin 3.9 3.5 - 5.0 g/dL   AST 19 15 - 41 U/L   ALT 22 0 - 44 U/L   Alkaline Phosphatase 92 38 - 126 U/L   Total Bilirubin 0.4 0.3 - 1.2 mg/dL   GFR calc non Af Amer >60 >60 mL/min   GFR calc Af Amer >60 >60 mL/min   Anion gap 12 5 - 15  CBC with Differential  Result Value Ref Range   WBC 5.9 4.0 - 10.5 K/uL   RBC 4.13 3.87 - 5.11 MIL/uL   Hemoglobin 11.9 (L) 12.0 - 15.0 g/dL   HCT 14.4 31.5 - 40.0 %   MCV 93.2 80.0 - 100.0 fL   MCH 28.8 26.0 - 34.0 pg   MCHC 30.9 30.0 - 36.0 g/dL   RDW 86.7 61.9 - 50.9 %   Platelets 251 150 - 400 K/uL   nRBC 0.0 0.0 - 0.2 %   Neutrophils Relative % 49 %   Neutro Abs 2.9 1.7 - 7.7 K/uL   Lymphocytes Relative 41 %   Lymphs Abs 2.4 0.7 - 4.0 K/uL   Monocytes Relative 7 %   Monocytes Absolute 0.4 0.1 - 1.0 K/uL   Eosinophils Relative 3 %   Eosinophils Absolute 0.2 0.0 - 0.5 K/uL   Basophils Relative 0 %   Basophils Absolute 0.0 0.0 - 0.1 K/uL   Immature Granulocytes 0 %   Abs Immature Granulocytes 0.01 0.00 - 0.07 K/uL  Occult blood card to lab, stool Provider will collect  Result Value Ref Range   Fecal Occult Bld POSITIVE (A) NEGATIVE  Brain natriuretic peptide  Result Value Ref Range   B Natriuretic Peptide 17.7 0.0 - 100.0 pg/mL  Urinalysis, Routine w reflex microscopic  Result Value Ref Range   Color, Urine YELLOW YELLOW   APPearance CLEAR CLEAR   Specific Gravity, Urine 1.020 1.005 - 1.030   pH 6.0 5.0 - 8.0   Glucose, UA NEGATIVE NEGATIVE mg/dL   Hgb urine dipstick NEGATIVE NEGATIVE   Bilirubin Urine NEGATIVE NEGATIVE   Ketones, ur NEGATIVE NEGATIVE mg/dL   Protein, ur NEGATIVE NEGATIVE mg/dL   Nitrite NEGATIVE NEGATIVE   Leukocytes,Ua NEGATIVE NEGATIVE  Troponin I (High Sensitivity)  Result Value Ref Range   Troponin I (High Sensitivity) <2 <18 ng/L    Patient had a troponin pending for some earlier brief  episode of left-sided chest pain.  Troponin was less than 2.  Patient without any further chest pain.  Main concern has been GI bleed.  Patient with maroon and some reddish bowel movements had 3 of them today.  We did recommend admission for observation.  Patient does not want to do that.  She will follow-up with GI medicine she will return if the bleeding gets worse.  She was Hemoccult positive.  Patient also had some weight gain and some swelling to her feet and legs.  But there was no evidence of pulmonary edema.  BMP was fine.  Also another troponin was okay.  Chest x-ray also showed no abnormalities.  Patient did receive some Lasix for the leg swelling.  Patient will follow-up with GI medicine as well as her regular doctor.  She will return if the bleeding gets worse at all.   Fredia Sorrow, MD 11/18/18 [phone]

## 2018-11-18 NOTE — ED Provider Notes (Signed)
[facility] EMERGENCY DEPARTMENT Provider Note   CSN: [id number] Arrival date & time: 11/18/18  1742     History   Chief Complaint Chief Complaint  Patient presents with  . Shortness of Breath    HPI Melinda Woods is a 53 y.o. female past medical history of CHF, hypertension, fibromyalgia, RA who presents for evaluation of weight gain, bilateral lower extremity swelling and some mild shortness of breath.  She reports that she has a history of CHF and is on Lasix.  She reports that about 5 days ago, she noticed some swelling to her bilateral lower extremities.  Her home health nurse called her today and stated that she had had a 17 pound weight gain over the last 5 days.  She reports normally she takes 1 Lasix a day but when she started having some worsening swelling, she doubled and started taking 2 Lasix a day.  She states she has some mild shortness of breath but states that this is kind of baseline.  She does feel like her leg swelling is worse than baseline.  Overlying warmth, erythema.  She has not had any cough, PND, orthopnea.  She normally sleeps with 6 pillows and states he has not had to increase it.  She states she has not had any fever, abdominal pain, nausea/vomiting.  She does report today, she had an episode of bright red blood noted in her stool.  She is not currently on blood thinners.       The history is provided by the patient.    Past Medical History:  Diagnosis Date  . Anxiety   . Asthma   . CHF (congestive heart failure) (HCC)    grade 1 dd on echo 03/2016  . Fatty liver   . Fibromyalgia   . Hypertension   . Neuropathy   . Ovarian cyst   . RA (rheumatoid arthritis) [facility]     Patient Active Problem List   Diagnosis Date Noted  . Musculoskeletal chest pain 08/26/2017  . Asthma exacerbation 04/13/2017  . Asthma exacerbation attacks 04/13/2017  . Hypokalemia 04/13/2017  . Acute bronchitis 01/29/2017  . Depression 01/29/2017  . SIRS (systemic  inflammatory response syndrome) (HCC) 01/29/2017  . RA (rheumatoid arthritis) (HCC)   . Acute respiratory failure (HCC) 04/26/2016  . Fatty liver 04/25/2016  . Hypertension 04/23/2016  . Chronic diastolic CHF (congestive heart failure) (HCC) 04/23/2016  . Morbid obesity (HCC) 04/23/2016  . Peripheral neuropathy 04/23/2016  . Right sided weakness 03/13/2014  . TIA (transient ischemic attack) 03/13/2014    Past Surgical History:  Procedure Laterality Date  . TUBAL LIGATION       OB History   No obstetric history on file.      Home Medications    Prior to Admission medications   Medication Sig Start Date End Date Taking? Authorizing Provider  acetaminophen (TYLENOL) 650 MG CR tablet Take 1,300 mg by mouth daily as needed for pain.    [provider]  albuterol (VENTOLIN HFA) 108 (90 Base) MCG/ACT inhaler Inhale 2 puffs into the lungs every 4 (four) hours as needed for wheezing or shortness of breath. 10/19/18   Derwood Kaplan, MD  amitriptyline (ELAVIL) 100 MG tablet Take 1 tablet (100 mg total) by mouth 2 (two) times daily. 04/26/16   Calvert Cantor, MD  aspirin EC 81 MG tablet Take 1 tablet (81 mg total) by mouth daily. 08/26/17   Delano Metz, MD  azithromycin (ZITHROMAX) 250 MG tablet Take 1 tablet (  250 mg total) by mouth daily. Take first 2 tablets together, then 1 every day until finished. 10/19/18   Derwood KaplanNanavati, Ankit, MD  dextromethorphan-guaiFENesin (MUCINEX DM) 30-600 MG 12hr tablet Take 1 tablet by mouth 2 (two) times daily. 02/02/17   Kathlen ModyAkula, Vijaya, MD  DULoxetine (CYMBALTA) 60 MG capsule Take 60 mg by mouth daily. 08/16/17   [provider]  furosemide (LASIX) 40 MG tablet Take 1 tablet (40 mg total) by mouth daily. 08/27/17   Delano MetzSchertz, Robert, MD  hydrOXYzine (ATARAX/VISTARIL) 25 MG tablet Take 2 tablets (50 mg total) by mouth every 6 (six) hours as needed for anxiety. 02/22/17   Alvira MondaySchlossman, Erin, MD  ipratropium-albuterol (DUONEB) 0.5-2.5 (3) MG/3ML SOLN Take 3  mLs by nebulization 2 (two) times daily. Patient taking differently: Take 3 mLs by nebulization 2 (two) times daily as needed (shortness of breath/wheezing).  02/02/17   Kathlen ModyAkula, Vijaya, MD  losartan (COZAAR) 25 MG tablet Take 1 tablet (25 mg total) by mouth daily. 04/26/16   Calvert Cantorizwan, Saima, MD  metoprolol tartrate (LOPRESSOR) 25 MG tablet Take 1 tablet (25 mg total) by mouth 2 (two) times daily. 04/26/16   Calvert Cantorizwan, Saima, MD  montelukast (SINGULAIR) 10 MG tablet Take 10 mg by mouth daily. 08/22/17   [provider]  montelukast (SINGULAIR) 10 MG tablet Take 1 tablet (10 mg total) by mouth daily. 08/26/17 08/26/18  Delano MetzSchertz, Robert, MD  polyethylene glycol (MIRALAX / Ethelene HalGLYCOLAX) packet Take 17 g by mouth daily as needed for mild constipation. Patient taking differently: Take 17 g by mouth daily as needed for mild constipation. For constipation 04/26/16   Calvert Cantorizwan, Saima, MD  potassium chloride SA (K-DUR,KLOR-CON) 20 MEQ tablet Take 1 tablet (20 mEq total) by mouth daily as needed (always take with the Furosemide). 04/26/16   Calvert Cantorizwan, Saima, MD  predniSONE (DELTASONE) 10 MG tablet Take 5 tablets (50 mg total) by mouth daily. 10/19/18   Derwood KaplanNanavati, Ankit, MD    Family History Family History  Problem Relation Age of Onset  . Stroke Mother   . Diabetes Mellitus II Mother   . Lung cancer Father     Social History Social History   Tobacco Use  . Smoking status: Never Smoker  . Smokeless tobacco: Never Used  Substance Use Topics  . Alcohol use: No  . Drug use: No     Allergies   Latex   Review of Systems Review of Systems  Constitutional: Positive for unexpected weight change. Negative for fever.  Respiratory: Positive for shortness of breath. Negative for cough.   Cardiovascular: Positive for leg swelling. Negative for chest pain.  Gastrointestinal: Negative for abdominal pain, nausea and vomiting.  Genitourinary: Negative for dysuria and hematuria.  Neurological: Negative for headaches.  All  other systems reviewed and are negative.    Physical Exam Updated Vital Signs BP (!) 108/55 (BP Location: Left Arm)   Pulse 93   Temp 99.5 F (37.5 C) (Oral)   Resp 18   Ht 5\' 6"  (1.676 m)   Wt 128.2 kg   SpO2 97%   BMI 45.61 kg/m   Physical Exam Vitals signs and nursing note reviewed. Exam conducted with a chaperone present.  Constitutional:      Appearance: Normal appearance. She is well-developed.  HENT:     Head: Normocephalic and atraumatic.  Eyes:     General: Lids are normal.     Conjunctiva/sclera: Conjunctivae normal.     Pupils: Pupils are equal, round, and reactive to light.  Neck:  Musculoskeletal: Full passive range of motion without pain.  Cardiovascular:     Rate and Rhythm: Normal rate and regular rhythm.     Pulses: Normal pulses.     Heart sounds: Normal heart sounds. No murmur. No friction rub. No gallop.   Pulmonary:     Effort: Pulmonary effort is normal.     Breath sounds: Normal breath sounds.     Comments: Exam limited to body habitus.  No evidence of decreased breath sounds.  No wheezing. Abdominal:     Palpations: Abdomen is soft. Abdomen is not rigid.     Tenderness: There is no abdominal tenderness. There is no guarding.     Comments: Abdomen is soft, non-distended, non-tender. No rigidity, No guarding. No peritoneal signs.  Genitourinary:    Rectum: Guaiac result positive. No tenderness.     Comments: The exam was performed with a chaperone present.  Small anal fissure noted.  There was some bright red/brown stool noted on digital rectal exam.  No black or tarry stool. Musculoskeletal: Normal range of motion.     Comments: Lateral lower extremities are symmetric in appearance without any overlying warmth, erythema.  She has some amount of 2+ pitting edema noted around the ankles and distal tib-fib that extends and 1+ pitting edema noted up to the knees.  Skin:    General: Skin is warm and dry.     Capillary Refill: Capillary refill takes  less than 2 seconds.  Neurological:     Mental Status: She is alert and oriented to person, place, and time.  Psychiatric:        Speech: Speech normal.      ED Treatments / Results  Labs (all labs ordered are listed, but only abnormal results are displayed) Labs Reviewed  COMPREHENSIVE METABOLIC PANEL - Abnormal; Notable for the following components:      Result Value   Potassium 3.3 (*)    Chloride 96 (*)    All other components within normal limits  CBC WITH DIFFERENTIAL/PLATELET - Abnormal; Notable for the following components:   Hemoglobin 11.9 (*)    All other components within normal limits  OCCULT BLOOD X 1 CARD TO LAB, STOOL - Abnormal; Notable for the following components:   Fecal Occult Bld POSITIVE (*)    All other components within normal limits  BRAIN NATRIURETIC PEPTIDE  URINALYSIS, ROUTINE W REFLEX MICROSCOPIC  TROPONIN I (HIGH SENSITIVITY)    EKG EKG Interpretation  Date/Time:  Tuesday November 18 2018 17:47:12 EDT Ventricular Rate:  110 PR Interval:  134 QRS Duration: 80 QT Interval:  328 QTC Calculation: 443 R Axis:   52 Text Interpretation:  Sinus tachycardia Nonspecific T wave abnormality Abnormal ECG No significant change since last tracing Confirmed by Vanetta Mulders [phone]) on 11/18/2018 7:05:29 PM   Radiology No results found.  Procedures Procedures (including critical care time)  Medications Ordered in ED Medications  furosemide (LASIX) injection 20 mg (20 mg Intravenous Given 11/18/18 2126)  albuterol (VENTOLIN HFA) 108 (90 Base) MCG/ACT inhaler 4 puff (4 puffs Inhalation Given 11/18/18 2131)     Initial Impression / Assessment and Plan / ED Course  I have reviewed the triage vital signs and the nursing notes.  Pertinent labs & imaging results that were available during my care of the patient were reviewed by me and considered in my medical decision making (see chart for details).        53 year old female past medical history  of CHF, fibromyalgia, hypertension who  presents for evaluation of 17 pound weight gain x5 days.  Also reports some leg swelling, shortness of breath.  She did try double up on her Lasix.  No PND, orthopnea.  On initial arrival, she is afebrile, nontoxic-appearing.  She is sitting comfortably in bed with no signs of acute distress.  She is slightly tachycardic.  Vitals otherwise stable.  On exam, she does have some bilateral lower extremity edema.  Concern for CHF exacerbation.  Plan to check labs, chest x-ray.  Chest x-ray negative for any infectious etiology.  No evidence of edema or consultation.  UA negative for infection.  Fecal occult is positive.  BNP is unremarkable.  CMP shows potassium of 3.3.  Otherwise unremarkable.  CBC shows no leukocytosis.  Hemoglobin is stable at 11.9.  Her most recent one was 12.5 about a month ago.  Reevaluation.  Patient reports improvement in breathing.  Her O2 sats are stable.  She does report she had 1 more episode of bright red stools noted.  She does report she has to strain hard when she does it.  Question if this is due to internal hemorrhoids this brings up in 3 episodes.  She had a colonoscopy about a year ago and states that it was normal.  She has followed up with GI but she does not remember exactly who it was.  She states that there is no abnormalities on her colonoscopy.  Patient denies any abdominal pain and she is not currently on blood thinners.  Repeat abdominal exam is benign.  Additionally, patient also reported that she was having some pain to the anterior chest.  She does report that this pain is been constant over the last 2 days.  Is not worse with exertion denies any associated diaphoresis, vomiting.  She states it feels like a "twinge type pain."  She thought it was previously related to trouble breathing.  Given her history/risk factors, will plan for Trope though my suspicion for ACS etiology is low.  Review of records.  I do not see any  colonoscopy report though does mention that she had it done in November 2019.  She had previously seen a GI doctor in [place].  I discussed with patient.  Given that she has had 3 episodes of bloody bowel movements, I did offer patient admission.  I discussed at length regarding admission versus outpatient follow-up.  Patient does not wish to be admitted at this time.  She is hemodynamically stable and hemoglobin is stable at 11.9.  I discussed with Dr. Rogene Houston who is agreeable to plan.  I discussed with patient that at any time if she has worsening bloody bowel movements or has any worsening symptoms, she is to return to the emergency department immediately.   Patient signed out with Trop pending. If trop negative, plan for discharge home. Her CP sounds non cardiac in nature. It has been constant for about 36 hours. Do not feel this is representative of ACS etiology.   Portions of this note were generated with [employer]. Dictation errors may occur despite best attempts at proofreading.  Final Clinical Impressions(s) / ED Diagnoses   Final diagnoses:  Rectal bleeding  SOB (shortness of breath)    ED Discharge Orders    None       Volanda Napoleon, PA-C 11/21/18 0010    Fredia Sorrow, MD 11/24/18 0800

## 2018-11-18 NOTE — Discharge Instructions (Signed)
Your work-up today showed that your hemoglobin levels were stable.  As we discussed, if you have any worsening bleeding with bowel movements, pain, feeling lightheaded or dizzy or episodes of passing out, you need to return to the emergency department immediately.  You need to follow-up with GI regarding the rectal bleeding.  He can either follow-up with your previous GI doctor follow-up with referred GI doctor.  Return the emergency department sooner for any trouble breathing, chest pain, abdominal pain, worsening blood in stools or any other worsening or concerning symptoms.

## 2018-11-18 NOTE — ED Notes (Signed)
Pt resting quietly awaiting provider.  Reports increasing lasix dose on Friday per pmd suggestion when pt noticed increased swelling to extremities.  No change in symptoms.  Pt reports mild exertional shortness of breath.  No acute distress.

## 2018-11-18 NOTE — ED Notes (Signed)
Patient reports more bright red blood in stool after using bathroom.

## 2018-11-25 ENCOUNTER — Encounter (HOSPITAL_BASED_OUTPATIENT_CLINIC_OR_DEPARTMENT_OTHER): Payer: Self-pay

## 2018-11-25 ENCOUNTER — Emergency Department (HOSPITAL_BASED_OUTPATIENT_CLINIC_OR_DEPARTMENT_OTHER): Payer: 59

## 2018-11-25 ENCOUNTER — Other Ambulatory Visit: Payer: Self-pay

## 2018-11-25 ENCOUNTER — Inpatient Hospital Stay (HOSPITAL_BASED_OUTPATIENT_CLINIC_OR_DEPARTMENT_OTHER)
Admission: EM | Admit: 2018-11-25 | Discharge: 2018-11-27 | DRG: 292 | Disposition: A | Discharge status: Home / Self Care | Payer: 59 | Attending: Internal Medicine | Admitting: Internal Medicine

## 2018-11-25 DIAGNOSIS — I5032 Chronic diastolic (congestive) heart failure: Secondary | ICD-10-CM | POA: Diagnosis present

## 2018-11-25 DIAGNOSIS — N83209 Unspecified ovarian cyst, unspecified side: Secondary | ICD-10-CM | POA: Diagnosis present

## 2018-11-25 DIAGNOSIS — I110 Hypertensive heart disease with heart failure: Principal | ICD-10-CM | POA: Diagnosis present

## 2018-11-25 DIAGNOSIS — I69851 Hemiplegia and hemiparesis following other cerebrovascular disease affecting right dominant side: Secondary | ICD-10-CM | POA: Diagnosis not present

## 2018-11-25 DIAGNOSIS — I5033 Acute on chronic diastolic (congestive) heart failure: Secondary | ICD-10-CM | POA: Diagnosis present

## 2018-11-25 DIAGNOSIS — Z713 Dietary counseling and surveillance: Secondary | ICD-10-CM | POA: Diagnosis not present

## 2018-11-25 DIAGNOSIS — K760 Fatty (change of) liver, not elsewhere classified: Secondary | ICD-10-CM | POA: Diagnosis present

## 2018-11-25 DIAGNOSIS — Z6841 Body Mass Index (BMI) 40.0 and over, adult: Secondary | ICD-10-CM

## 2018-11-25 DIAGNOSIS — I5022 Chronic systolic (congestive) heart failure: Secondary | ICD-10-CM | POA: Diagnosis not present

## 2018-11-25 DIAGNOSIS — M797 Fibromyalgia: Secondary | ICD-10-CM | POA: Diagnosis present

## 2018-11-25 DIAGNOSIS — G629 Polyneuropathy, unspecified: Secondary | ICD-10-CM | POA: Diagnosis present

## 2018-11-25 DIAGNOSIS — Z20828 Contact with and (suspected) exposure to other viral communicable diseases: Secondary | ICD-10-CM | POA: Diagnosis present

## 2018-11-25 DIAGNOSIS — I509 Heart failure, unspecified: Secondary | ICD-10-CM

## 2018-11-25 DIAGNOSIS — J45909 Unspecified asthma, uncomplicated: Secondary | ICD-10-CM | POA: Diagnosis not present

## 2018-11-25 DIAGNOSIS — Z8673 Personal history of transient ischemic attack (TIA), and cerebral infarction without residual deficits: Secondary | ICD-10-CM | POA: Diagnosis not present

## 2018-11-25 DIAGNOSIS — Z91040 Latex allergy status: Secondary | ICD-10-CM

## 2018-11-25 DIAGNOSIS — R0789 Other chest pain: Secondary | ICD-10-CM | POA: Diagnosis not present

## 2018-11-25 DIAGNOSIS — M069 Rheumatoid arthritis, unspecified: Secondary | ICD-10-CM | POA: Diagnosis present

## 2018-11-25 DIAGNOSIS — F419 Anxiety disorder, unspecified: Secondary | ICD-10-CM | POA: Diagnosis present

## 2018-11-25 DIAGNOSIS — Z7952 Long term (current) use of systemic steroids: Secondary | ICD-10-CM

## 2018-11-25 DIAGNOSIS — I5031 Acute diastolic (congestive) heart failure: Secondary | ICD-10-CM | POA: Diagnosis not present

## 2018-11-25 DIAGNOSIS — Z7982 Long term (current) use of aspirin: Secondary | ICD-10-CM | POA: Diagnosis not present

## 2018-11-25 DIAGNOSIS — R079 Chest pain, unspecified: Secondary | ICD-10-CM

## 2018-11-25 DIAGNOSIS — Z823 Family history of stroke: Secondary | ICD-10-CM

## 2018-11-25 DIAGNOSIS — Z79899 Other long term (current) drug therapy: Secondary | ICD-10-CM | POA: Diagnosis not present

## 2018-11-25 DIAGNOSIS — I10 Essential (primary) hypertension: Secondary | ICD-10-CM | POA: Diagnosis not present

## 2018-11-25 LAB — BASIC METABOLIC PANEL
Anion gap: 9 (ref 5–15)
BUN: 15 mg/dL (ref 6–20)
CO2: 27 mmol/L (ref 22–32)
Calcium: 9.4 mg/dL (ref 8.9–10.3)
Chloride: 101 mmol/L (ref 98–111)
Creatinine, Ser: 0.76 mg/dL (ref 0.44–1.00)
GFR calc Af Amer: >60 mL/min (ref >60)
GFR calc non Af Amer: >60 mL/min (ref >60)
Glucose, Bld: 94 mg/dL (ref 70–99)
Potassium: 3.9 mmol/L (ref 3.5–5.1)
Sodium: 137 mmol/L (ref 135–145)

## 2018-11-25 LAB — CBC
HCT: 40.7 % (ref 36.0–46.0)
Hemoglobin: 12.6 g/dL (ref 12.0–15.0)
MCH: 29.2 pg (ref 26.0–34.0)
MCHC: 31 g/dL (ref 30.0–36.0)
MCV: 94.2 fL (ref 80.0–100.0)
Platelets: 240 10*3/uL (ref 150–400)
RBC: 4.32 MIL/uL (ref 3.87–5.11)
RDW: 13.9 % (ref 11.5–15.5)
WBC: 5.7 10*3/uL (ref 4.0–10.5)
nRBC: 0 % (ref 0.0–0.2)

## 2018-11-25 LAB — TROPONIN I (HIGH SENSITIVITY)
Troponin I (High Sensitivity): <2 ng/L (ref <18)
Troponin I (High Sensitivity): <2 ng/L (ref <18)

## 2018-11-25 LAB — SARS CORONAVIRUS 2 BY RT PCR (HOSPITAL ORDER, PERFORMED IN ~~LOC~~ HOSPITAL LAB): SARS Coronavirus 2: NEGATIVE

## 2018-11-25 LAB — BRAIN NATRIURETIC PEPTIDE: B Natriuretic Peptide: 21.9 pg/mL (ref 0.0–100.0)

## 2018-11-25 MED ORDER — SODIUM CHLORIDE 0.9 % IV SOLN: 250.0000 mL | INTRAVENOUS | Status: DC | PRN

## 2018-11-25 MED ORDER — FUROSEMIDE 10 MG/ML IJ SOLN
40.0000 mg | INTRAMUSCULAR | Status: AC
  Administered 2018-11-25: 40 mg via INTRAVENOUS
  Filled 2018-11-25: qty 4

## 2018-11-25 MED ORDER — SODIUM CHLORIDE 0.9% FLUSH
3.0000 mL | Freq: Once | INTRAVENOUS | Status: DC
  Filled 2018-11-25: qty 3

## 2018-11-25 MED ORDER — FUROSEMIDE 10 MG/ML IJ SOLN
80.0000 mg | Freq: Two times a day (BID) | INTRAMUSCULAR | Status: DC
  Administered 2018-11-26 – 2018-11-27 (×3): 80 mg via INTRAVENOUS
  Filled 2018-11-25 (×3): qty 8

## 2018-11-25 MED ORDER — SODIUM CHLORIDE 0.9% FLUSH: 3.0000 mL | INTRAVENOUS | Status: DC | PRN

## 2018-11-25 MED ORDER — FENTANYL CITRATE (PF) 100 MCG/2ML IJ SOLN
100.0000 ug | Freq: Once | INTRAMUSCULAR | Status: AC
  Administered 2018-11-25: 100 ug via INTRAVENOUS

## 2018-11-25 MED ORDER — ONDANSETRON HCL 4 MG/2ML IJ SOLN: 4.0000 mg | Freq: Four times a day (QID) | INTRAMUSCULAR | Status: DC | PRN

## 2018-11-25 MED ORDER — ACETAMINOPHEN 325 MG PO TABS: 650.0000 mg | ORAL_TABLET | ORAL | Status: DC | PRN

## 2018-11-25 MED ORDER — NITROGLYCERIN 0.4 MG SL SUBL
0.4000 mg | SUBLINGUAL_TABLET | SUBLINGUAL | Status: DC | PRN
  Administered 2018-11-25: 18:00:00 0.4 mg via SUBLINGUAL
  Filled 2018-11-25 (×2): qty 1

## 2018-11-25 MED ORDER — ENOXAPARIN SODIUM 40 MG/0.4ML ~~LOC~~ SOLN
40.0000 mg | Freq: Every day | SUBCUTANEOUS | Status: DC
  Administered 2018-11-25 – 2018-11-26 (×2): 40 mg via SUBCUTANEOUS
  Filled 2018-11-25 (×2): qty 0.4

## 2018-11-25 MED ORDER — FENTANYL CITRATE (PF) 100 MCG/2ML IJ SOLN
INTRAMUSCULAR | Status: AC
  Filled 2018-11-25: qty 2

## 2018-11-25 MED ORDER — FUROSEMIDE 10 MG/ML IJ SOLN: 40.0000 mg | Freq: Once | INTRAMUSCULAR | Status: DC

## 2018-11-25 MED ORDER — ASPIRIN 81 MG PO CHEW
324.0000 mg | CHEWABLE_TABLET | Freq: Once | ORAL | Status: AC
  Administered 2018-11-25: 324 mg via ORAL
  Filled 2018-11-25: qty 4

## 2018-11-25 MED ORDER — SODIUM CHLORIDE 0.9% FLUSH
3.0000 mL | Freq: Two times a day (BID) | INTRAVENOUS | Status: DC
  Administered 2018-11-25 – 2018-11-27 (×4): 3 mL via INTRAVENOUS

## 2018-11-25 MED ORDER — MORPHINE SULFATE (PF) 2 MG/ML IV SOLN
2.0000 mg | INTRAVENOUS | Status: DC | PRN
  Administered 2018-11-25 – 2018-11-26 (×4): 2 mg via INTRAVENOUS
  Filled 2018-11-25 (×4): qty 1

## 2018-11-25 NOTE — H&P (Signed)
History and Physical    Melinda Woods XIP:[id number] DOB: [birth year] DOA: 11/25/2018  PCP: Nicola Girt, DO  Patient coming from: Home  I have personally briefly reviewed patient's old medical records in [place]  Chief Complaint: CP, SOB  HPI: Melinda Woods is a 53 y.o. female with medical history significant of RA on MTX, asthma, HTN, grade 1 diastolic dysfunction, morbid obesity.  Patient was evaluated in ED at [facility] on 9/22 for SOB and 18 lb wt gain over past weeks.  Work up reassuring and discharged home on increased lasix dose.  Despite increased PO lasix dose she has continued to gain wait, SOB has continued to worsen.  Her SOB is worse with laying down and on exertion, better sitting up.  Today after she walked back from going to the mailbox, she began having left-sided, nonradiating chest pressure associated with shortness of breath.  No fevers, chills, N/V/D, recent travel, sick contacts.  COVID test on 9/22 negative.   ED Course: COVID test again negative today.  Trop neg on 9/22 and again x2 today (HS trop < 2).  Has h/o CT calcium score last year of 0.  CXR shows pulmonary edema despite a BNP which is also nl at 21.  Pulm edema is new compared to 9/22 CXR.  Given 40mg  IV lasix in ED.  Only urinated once since being given lasix a couple hours ago she says.  BUN and creat are nl.   Review of Systems: As per HPI, otherwise all review of systems negative.  Past Medical History:  Diagnosis Date   Anxiety    Asthma    CHF (congestive heart failure) ([place])    grade 1 dd on echo 03/2016   Fatty liver    Fibromyalgia    Hypertension    Neuropathy    Ovarian cyst    RA (rheumatoid arthritis) ([place])     Past Surgical History:  Procedure Laterality Date   TUBAL LIGATION       reports that she has never smoked. She has never used smokeless tobacco. She reports that she does not drink alcohol or use drugs.  Allergies  Allergen  Reactions   Latex Rash    Family History  Problem Relation Age of Onset   Stroke Mother    Diabetes Mellitus II Mother    Lung cancer Father      Prior to Admission medications   Medication Sig Start Date End Date Taking? Authorizing Provider  acetaminophen (TYLENOL) 650 MG CR tablet Take 1,300 mg by mouth daily as needed for pain.    [provider]  albuterol (VENTOLIN HFA) 108 (90 Base) MCG/ACT inhaler Inhale 2 puffs into the lungs every 4 (four) hours as needed for wheezing or shortness of breath. 10/19/18   Varney Biles, MD  amitriptyline (ELAVIL) 100 MG tablet Take 1 tablet (100 mg total) by mouth 2 (two) times daily. 04/26/16   Debbe Odea, MD  aspirin EC 81 MG tablet Take 1 tablet (81 mg total) by mouth daily. 08/26/17   Roney Jaffe, MD  azithromycin (ZITHROMAX) 250 MG tablet Take 1 tablet (250 mg total) by mouth daily. Take first 2 tablets together, then 1 every day until finished. 10/19/18   Varney Biles, MD  dextromethorphan-guaiFENesin (MUCINEX DM) 30-600 MG 12hr tablet Take 1 tablet by mouth 2 (two) times daily. 02/02/17   Hosie Poisson, MD  DULoxetine (CYMBALTA) 60 MG capsule Take 60 mg by mouth daily. 08/16/17   [provider]  furosemide (LASIX) 40 MG tablet Take 1 tablet (40 mg total) by mouth daily. 08/27/17   Delano Metz, MD  hydrOXYzine (ATARAX/VISTARIL) 25 MG tablet Take 2 tablets (50 mg total) by mouth every 6 (six) hours as needed for anxiety. 02/22/17   Alvira Monday, MD  ipratropium-albuterol (DUONEB) 0.5-2.5 (3) MG/3ML SOLN Take 3 mLs by nebulization 2 (two) times daily. Patient taking differently: Take 3 mLs by nebulization 2 (two) times daily as needed (shortness of breath/wheezing).  02/02/17   Kathlen Mody, MD  losartan (COZAAR) 25 MG tablet Take 1 tablet (25 mg total) by mouth daily. 04/26/16   Calvert Cantor, MD  metoprolol tartrate (LOPRESSOR) 25 MG tablet Take 1 tablet (25 mg total) by mouth 2 (two) times daily. 04/26/16   Calvert Cantor, MD  montelukast (SINGULAIR) 10 MG tablet Take 10 mg by mouth daily. 08/22/17   [provider]  montelukast (SINGULAIR) 10 MG tablet Take 1 tablet (10 mg total) by mouth daily. 08/26/17 08/26/18  Delano Metz, MD  polyethylene glycol (MIRALAX / Ethelene Hal) packet Take 17 g by mouth daily as needed for mild constipation. Patient taking differently: Take 17 g by mouth daily as needed for mild constipation. For constipation 04/26/16   Calvert Cantor, MD  potassium chloride SA (K-DUR,KLOR-CON) 20 MEQ tablet Take 1 tablet (20 mEq total) by mouth daily as needed (always take with the Furosemide). 04/26/16   Calvert Cantor, MD  predniSONE (DELTASONE) 10 MG tablet Take 5 tablets (50 mg total) by mouth daily. 10/19/18   Derwood Kaplan, MD    Physical Exam: Vitals:   11/25/18 1845 11/25/18 1900 11/25/18 1915 11/25/18 2143  BP: (!) 112/101 (!) 122/91 105/72 (!) 127/94  Pulse: 87 86 85 90  Resp: 13 (!) 9 12 20   Temp:    98.4 F (36.9 C)  TempSrc:    Oral  SpO2: 98% 99% 100% 97%  Weight:      Height:        Constitutional: NAD, calm, comfortable Eyes: PERRL, lids and conjunctivae normal ENMT: Mucous membranes are moist. Posterior pharynx clear of any exudate or lesions.Normal dentition.  Neck: normal, supple, no masses, no thyromegaly Respiratory: Diminished BS bilaterally Cardiovascular: Regular rate and rhythm, no murmurs / rubs / gallops. 2+ BLE edema 2+ pedal pulses. No carotid bruits.  Abdomen: no tenderness, no masses palpated. No hepatosplenomegaly. Bowel sounds positive.  Musculoskeletal: no clubbing / cyanosis. No joint deformity upper and lower extremities. Good ROM, no contractures. Normal muscle tone.  Skin: no rashes, lesions, ulcers. No induration Neurologic: CN 2-12 grossly intact. Sensation intact, DTR normal. Strength 5/5 in all 4.  Psychiatric: Normal judgment and insight. Alert and oriented x 3. Normal mood.    Labs on Admission: I have personally reviewed following  labs and imaging studies  CBC: Recent Labs  Lab 11/25/18 1557  WBC 5.7  HGB 12.6  HCT 40.7  MCV 94.2  PLT 240   Basic Metabolic Panel: Recent Labs  Lab 11/25/18 1557  NA 137  K 3.9  CL 101  CO2 27  GLUCOSE 94  BUN 15  CREATININE 0.76  CALCIUM 9.4   GFR: Estimated Creatinine Clearance: 113.9 mL/min (by C-G formula based on SCr of 0.76 mg/dL). Liver Function Tests: No results for input(s): AST, ALT, ALKPHOS, BILITOT, PROT, ALBUMIN in the last 168 hours. No results for input(s): LIPASE, AMYLASE in the last 168 hours. No results for input(s): AMMONIA in the last 168 hours. Coagulation Profile: No results for input(s): INR, PROTIME  in the last 168 hours. Cardiac Enzymes: No results for input(s): CKTOTAL, CKMB, CKMBINDEX, TROPONINI in the last 168 hours. BNP (last 3 results) No results for input(s): PROBNP in the last 8760 hours. HbA1C: No results for input(s): HGBA1C in the last 72 hours. CBG: No results for input(s): GLUCAP in the last 168 hours. Lipid Profile: No results for input(s): CHOL, HDL, LDLCALC, TRIG, CHOLHDL, LDLDIRECT in the last 72 hours. Thyroid Function Tests: No results for input(s): TSH, T4TOTAL, FREET4, T3FREE, THYROIDAB in the last 72 hours. Anemia Panel: No results for input(s): VITAMINB12, FOLATE, FERRITIN, TIBC, IRON, RETICCTPCT in the last 72 hours. Urine analysis:    Component Value Date/Time   COLORURINE YELLOW 11/18/2018 1954   APPEARANCEUR CLEAR 11/18/2018 1954   LABSPEC 1.020 11/18/2018 1954   PHURINE 6.0 11/18/2018 1954   GLUCOSEU NEGATIVE 11/18/2018 1954   HGBUR NEGATIVE 11/18/2018 1954   BILIRUBINUR NEGATIVE 11/18/2018 1954   KETONESUR NEGATIVE 11/18/2018 1954   PROTEINUR NEGATIVE 11/18/2018 1954   UROBILINOGEN 1.0 03/13/2014 1222   NITRITE NEGATIVE 11/18/2018 1954   LEUKOCYTESUR NEGATIVE 11/18/2018 1954    Radiological Exams on Admission: Dg Chest Port 1 View  Result Date: 11/25/2018 CLINICAL DATA:  Shortness of breath,  chest pain with decreased O2 sats. EXAM: PORTABLE CHEST 1 VIEW COMPARISON:  11/18/2018 FINDINGS: Lung volumes are diminished. Heart size stable accounting for low lung volumes. Increased interstitial prominence with interval development of basilar perihilar opacities. IMPRESSION: Increasing airspace and interstitial changes suspicious for edema with concomitant volume loss. Multifocal infection is also considered Electronically Signed   By: Donzetta Kohut M.D.   On: 11/25/2018 16:39    EKG: Independently reviewed.  Assessment/Plan Principal Problem:   Acute CHF (congestive heart failure) (HCC) Active Problems:   Chronic diastolic CHF (congestive heart failure) (HCC)    1. Acute on chronic diastolic CHF - 1. CHF pathway 2. Giving 2nd round of 40mg  IV lasix now to make 80mg  total 3. Then 80mg  IV BID lasix 4. Tele monitor 5. 2d echo 6. Strict intake and output 7. Daily BMP 8. DDx: 1. COVID unlikely given 2 negative COVID tests this past week and lack of other COVID symptoms 2. No SIRS to suggest CAP, also not a lobar appearance on CXR 3. MTX pulmonary toxicity is a possibility but seems less likely given the wt gain and peripheral edema patient has.  If not improving on Lasix then would get HRCT as next step. 4. HS Trop neg 9/22, and 2x neg again today; no obvious ischemic EKG findings; also has CT coronary calcium score of 0 last year: ACS seems really unlikely. 2. HTN - continue home ARB / BB once med rec completed  DVT prophylaxis: Lovenox Code Status: Full Family Communication: No family in room Disposition Plan: Home after admit Consults called: None Admission status: Admit to inpatient  Severity of Illness: The appropriate patient status for this patient is INPATIENT. Inpatient status is judged to be reasonable and necessary in order to provide the required intensity of service to ensure the patient's safety. The patient's presenting symptoms, physical exam findings, and initial  radiographic and laboratory data in the context of their chronic comorbidities is felt to place them at high risk for further clinical deterioration. Furthermore, it is not anticipated that the patient will be medically stable for discharge from the hospital within 2 midnights of admission. The following factors support the patient status of inpatient.   IP status due to 18+ lbs of wt gain and need for  large volume diuresis likely to take multiple days of admission.   * I certify that at the point of admission it is my clinical judgment that the patient will require inpatient hospital care spanning beyond 2 midnights from the point of admission due to high intensity of service, high risk for further deterioration and high frequency of surveillance required.*    Murrel Freet M. DO [facility]  How to contact the [facility] Attending or Consulting provider 7A - 7P or covering provider during after hours 7P -7A, for this patient?  1. Check the care team in [facility] and look for a) attending/consulting [facility] provider listed and b) the [facility] team listed 2. Log into [email or web address]  Amion Physician Scheduling and messaging for groups and whole hospitals  On call and physician scheduling software for group practices, residents, hospitalists and other medical providers for call, clinic, rotation and shift schedules. OnCall Enterprise is a hospital-wide system for scheduling doctors and paging doctors on call. EasyPlot is for scientific plotting and data analysis.  [email or web address]  and use [facility]'s universal password to access. If you do not have the password, please contact the hospital operator.  3. Locate the [facility] provider you are looking for under [facility] and page to a number that you can be directly reached. 4. If you still have difficulty reaching the provider, please page the [facility] (Director on Call) for the Hospitalists listed on amion for assistance.  11/25/2018, 10:32 PM

## 2018-11-25 NOTE — Plan of Care (Signed)
Patient is a 53 year old female with history of hypertension who presents with what looks like CHF exacerbation.  She had been seen 2 weeks ago in ED with increasing DOE orthopnea and PND and 18 pound weight gain.  Her Lasix dose was increased and she was sent home to follow-up with PCP.  Patient now presents with much worsened DOE.  She is short of breath just walking to her mailbox.  She also has some exertional chest pain although she describes that more as a chest pressure that she gets when she gets short of breath.  EKG is normal.  Troponins are negative.  Patient is afebrile.  Chest x-ray is read as interstitial changes concerning for edema.  COVID test is pending.  Patient is accepted for cardiology telemetry bed pending negative COVID test.

## 2018-11-25 NOTE — ED Provider Notes (Signed)
[facility] EMERGENCY DEPARTMENT Provider Note   CSN: [id number] Arrival date & time: 11/25/18  1536     History   Chief Complaint Chief Complaint  Patient presents with  . Chest Pain    HPI Melinda Woods is a 53 y.o. female.     53 year old female with past medical history below including CHF, rheumatoid arthritis, hypertension, asthma, fibromyalgia, morbid obesity who presents with chest pain and shortness of breath.  Patient was evaluated here on 9/22 for shortness of breath and 18 pound weight gain.  Her work-up was reassuring and she was discharged home.  She reports that she has continued to have weight gain, increased lower extremity edema, orthopnea, and paroxysmal nocturnal dyspnea.  She has had dyspnea on exertion as well.  Today after she walked back from going to the mailbox, she began having left-sided, nonradiating chest pressure associated with shortness of breath.  No medications prior to arrival.  She denies any recent cough/cold symptoms, fevers, vomiting, diarrhea, sick contacts, or recent travel.  She is compliant with her medications.  The history is provided by the patient.    Past Medical History:  Diagnosis Date  . Anxiety   . Asthma   . CHF (congestive heart failure) (HCC)    grade 1 dd on echo 03/2016  . Fatty liver   . Fibromyalgia   . Hypertension   . Neuropathy   . Ovarian cyst   . RA (rheumatoid arthritis) [facility])     Patient Active Problem List   Diagnosis Date Noted  . Acute CHF (congestive heart failure) (HCC) 11/25/2018  . Musculoskeletal chest pain 08/26/2017  . Asthma exacerbation 04/13/2017  . Asthma exacerbation attacks 04/13/2017  . Hypokalemia 04/13/2017  . Acute bronchitis 01/29/2017  . Depression 01/29/2017  . SIRS (systemic inflammatory response syndrome) (HCC) 01/29/2017  . RA (rheumatoid arthritis) (HCC)   . Acute respiratory failure (HCC) 04/26/2016  . Fatty liver 04/25/2016  . Hypertension 04/23/2016  .  Chronic diastolic CHF (congestive heart failure) (HCC) 04/23/2016  . Morbid obesity (HCC) 04/23/2016  . Peripheral neuropathy 04/23/2016  . Right sided weakness 03/13/2014  . TIA (transient ischemic attack) 03/13/2014    Past Surgical History:  Procedure Laterality Date  . TUBAL LIGATION       OB History   No obstetric history on file.      Home Medications    Prior to Admission medications   Medication Sig Start Date End Date Taking? Authorizing Provider  acetaminophen (TYLENOL) 650 MG CR tablet Take 1,300 mg by mouth daily as needed for pain.    [provider]  albuterol (VENTOLIN HFA) 108 (90 Base) MCG/ACT inhaler Inhale 2 puffs into the lungs every 4 (four) hours as needed for wheezing or shortness of breath. 10/19/18   Derwood Kaplan, MD  amitriptyline (ELAVIL) 100 MG tablet Take 1 tablet (100 mg total) by mouth 2 (two) times daily. 04/26/16   Calvert Cantor, MD  aspirin EC 81 MG tablet Take 1 tablet (81 mg total) by mouth daily. 08/26/17   Delano Metz, MD  azithromycin (ZITHROMAX) 250 MG tablet Take 1 tablet (250 mg total) by mouth daily. Take first 2 tablets together, then 1 every day until finished. 10/19/18   Derwood Kaplan, MD  dextromethorphan-guaiFENesin (MUCINEX DM) 30-600 MG 12hr tablet Take 1 tablet by mouth 2 (two) times daily. 02/02/17   Kathlen Mody, MD  DULoxetine (CYMBALTA) 60 MG capsule Take 60 mg by mouth daily. 08/16/17   [provider]  furosemide (LASIX) 40 MG tablet Take 1 tablet (40 mg total) by mouth daily. 08/27/17   Delano MetzSchertz, Robert, MD  hydrOXYzine (ATARAX/VISTARIL) 25 MG tablet Take 2 tablets (50 mg total) by mouth every 6 (six) hours as needed for anxiety. 02/22/17   Alvira MondaySchlossman, Erin, MD  ipratropium-albuterol (DUONEB) 0.5-2.5 (3) MG/3ML SOLN Take 3 mLs by nebulization 2 (two) times daily. Patient taking differently: Take 3 mLs by nebulization 2 (two) times daily as needed (shortness of breath/wheezing).  02/02/17   Kathlen ModyAkula, Vijaya, MD   losartan (COZAAR) 25 MG tablet Take 1 tablet (25 mg total) by mouth daily. 04/26/16   Calvert Cantorizwan, Saima, MD  metoprolol tartrate (LOPRESSOR) 25 MG tablet Take 1 tablet (25 mg total) by mouth 2 (two) times daily. 04/26/16   Calvert Cantorizwan, Saima, MD  montelukast (SINGULAIR) 10 MG tablet Take 10 mg by mouth daily. 08/22/17   [provider]  montelukast (SINGULAIR) 10 MG tablet Take 1 tablet (10 mg total) by mouth daily. 08/26/17 08/26/18  Delano MetzSchertz, Robert, MD  polyethylene glycol (MIRALAX / Ethelene HalGLYCOLAX) packet Take 17 g by mouth daily as needed for mild constipation. Patient taking differently: Take 17 g by mouth daily as needed for mild constipation. For constipation 04/26/16   Calvert Cantorizwan, Saima, MD  potassium chloride SA (K-DUR,KLOR-CON) 20 MEQ tablet Take 1 tablet (20 mEq total) by mouth daily as needed (always take with the Furosemide). 04/26/16   Calvert Cantorizwan, Saima, MD  predniSONE (DELTASONE) 10 MG tablet Take 5 tablets (50 mg total) by mouth daily. 10/19/18   Derwood KaplanNanavati, Ankit, MD    Family History Family History  Problem Relation Age of Onset  . Stroke Mother   . Diabetes Mellitus II Mother   . Lung cancer Father     Social History Social History   Tobacco Use  . Smoking status: Never Smoker  . Smokeless tobacco: Never Used  Substance Use Topics  . Alcohol use: No  . Drug use: No     Allergies   Latex   Review of Systems Review of Systems All other systems reviewed and are negative except that which was mentioned in HPI   Physical Exam Updated Vital Signs BP 110/71   Pulse 82   Temp 98.6 F (37 C) (Oral)   Resp 14   Ht 5\' 6"  (1.676 m)   Wt 127.9 kg   SpO2 98%   BMI 45.52 kg/m   Physical Exam Vitals signs and nursing note reviewed.  Constitutional:      General: She is not in acute distress.    Appearance: She is well-developed.     Comments: uncomfortable  HENT:     Head: Normocephalic and atraumatic.  Eyes:     Conjunctiva/sclera: Conjunctivae normal.  Neck:      Musculoskeletal: Neck supple.  Cardiovascular:     Rate and Rhythm: Normal rate and regular rhythm.     Heart sounds: Normal heart sounds. No murmur.  Pulmonary:     Comments: Mild tachypnea without respiratory distress, diminished BS b/l Abdominal:     General: Bowel sounds are normal. There is no distension.     Palpations: Abdomen is soft.     Tenderness: There is no abdominal tenderness.  Musculoskeletal:     Right lower leg: Edema present.     Left lower leg: Edema present.     Comments: 2+ pitting edema BLE  Skin:    General: Skin is warm and dry.  Neurological:     Mental Status: She is alert and oriented  to person, place, and time.     Comments: Fluent speech  Psychiatric:        Mood and Affect: Mood normal.        Judgment: Judgment normal.      ED Treatments / Results  Labs (all labs ordered are listed, but only abnormal results are displayed) Labs Reviewed  SARS CORONAVIRUS 2 (HOSPITAL ORDER, PERFORMED IN [facility])  BASIC METABOLIC PANEL  CBC  BRAIN NATRIURETIC PEPTIDE  PREGNANCY, URINE  TROPONIN I (HIGH SENSITIVITY)  TROPONIN I (HIGH SENSITIVITY)    EKG EKG Interpretation  Date/Time:  Tuesday November 25 2018 15:41:55 EDT Ventricular Rate:  97 PR Interval:  144 QRS Duration: 82 QT Interval:  348 QTC Calculation: 441 R Axis:   61 Text Interpretation:  Normal sinus rhythm Normal ECG similar to previous Confirmed by Frederick Peers [phone]) on 11/25/2018 5:33:09 PM   Radiology Dg Chest Port 1 View  Result Date: 11/25/2018 CLINICAL DATA:  Shortness of breath, chest pain with decreased O2 sats. EXAM: PORTABLE CHEST 1 VIEW COMPARISON:  11/18/2018 FINDINGS: Lung volumes are diminished. Heart size stable accounting for low lung volumes. Increased interstitial prominence with interval development of basilar perihilar opacities. IMPRESSION: Increasing airspace and interstitial changes suspicious for edema with concomitant volume loss. Multifocal  infection is also considered Electronically Signed   By: Donzetta Kohut M.D.   On: 11/25/2018 16:39    Procedures Procedures (including critical care time)  Medications Ordered in ED Medications  sodium chloride flush (NS) 0.9 % injection 3 mL (has no administration in time range)  nitroGLYCERIN (NITROSTAT) SL tablet 0.4 mg (has no administration in time range)  aspirin chewable tablet 324 mg (has no administration in time range)  furosemide (LASIX) injection 40 mg (has no administration in time range)     Initial Impression / Assessment and Plan / ED Course  I have reviewed the triage vital signs and the nursing notes.  Pertinent labs & imaging results that were available during my care of the patient were reviewed by me and considered in my medical decision making (see chart for details).        Pt uncomfortable w/ mild dyspnea on exam, BP normal, very mild dip in O2 sat therefore placed on small amt of O2 via [place].  EKG without acute ischemic changes.  Gave aspirin, nitroglycerin.  Lab work shows normal BNP, negative troponin, reassuring CBC.  Her BNP is normal however review of past shows that she has never had an elevated BNP.  Chest x-ray is suggestive of pulmonary edema.  Based on her weight gain, exertional dyspnea, and edema, I suspect CHF exacerbation which has worsened at home despite compliance with medications.  Recommended admission for diuresis and further work-up of CP. Discussed w/ [facility], Dr. Luberta Robertson, and pt will be [facility] at [facility] for [facility] care. I have ordered 40mg  IV lasix and COVID-19 pending.  Montoya Watkin was evaluated in Emergency Department on 11/25/2018 for the symptoms described in the history of present illness. She was evaluated in the context of the global COVID-19 pandemic, which necessitated consideration that the patient might be at risk for infection with the SARS-CoV-2 virus that causes COVID-19. Institutional protocols and algorithms that pertain to  the evaluation of patients at risk for COVID-19 are in a state of rapid change based on information released by regulatory bodies including the CDC and federal and state organizations. These policies and algorithms were followed during the patient's care in the ED.  Final Clinical Impressions(s) / ED Diagnoses   Final diagnoses:  Acute on chronic congestive heart failure, unspecified heart failure type [facility]    ED Discharge Orders    None       , Wenda Overland, MD 11/25/18 [id number]

## 2018-11-25 NOTE — ED Notes (Signed)
Pt was brought into triage for CP protocol orders-pt is with SOB and decreased O2 sats-states her CP worse since triage end minutes ago-A Brown,RN with pt

## 2018-11-25 NOTE — ED Notes (Signed)
Placed on 4 l/m in triage

## 2018-11-25 NOTE — ED Triage Notes (Addendum)
Pt c/o left side CP since 3pm-to triage in w/c-grimacing-denies fever/flu sx

## 2018-11-26 ENCOUNTER — Inpatient Hospital Stay (HOSPITAL_COMMUNITY): Payer: 59

## 2018-11-26 DIAGNOSIS — J45909 Unspecified asthma, uncomplicated: Secondary | ICD-10-CM

## 2018-11-26 DIAGNOSIS — I5022 Chronic systolic (congestive) heart failure: Secondary | ICD-10-CM

## 2018-11-26 DIAGNOSIS — I10 Essential (primary) hypertension: Secondary | ICD-10-CM

## 2018-11-26 LAB — BASIC METABOLIC PANEL
Anion gap: 10 (ref 5–15)
BUN: 11 mg/dL (ref 6–20)
CO2: 28 mmol/L (ref 22–32)
Calcium: 9 mg/dL (ref 8.9–10.3)
Chloride: 100 mmol/L (ref 98–111)
Creatinine, Ser: 0.83 mg/dL (ref 0.44–1.00)
GFR calc Af Amer: >60 mL/min (ref >60)
GFR calc non Af Amer: >60 mL/min (ref >60)
Glucose, Bld: 121 mg/dL — ABNORMAL HIGH (ref 70–99)
Potassium: 3.5 mmol/L (ref 3.5–5.1)
Sodium: 138 mmol/L (ref 135–145)

## 2018-11-26 LAB — GLUCOSE, CAPILLARY: Glucose-Capillary: 107 mg/dL — ABNORMAL HIGH (ref 70–99)

## 2018-11-26 LAB — HIV ANTIBODY (ROUTINE TESTING W REFLEX): HIV Screen 4th Generation wRfx: NONREACTIVE

## 2018-11-26 LAB — ECHOCARDIOGRAM COMPLETE
Height: 66 in
Weight: 4456 oz

## 2018-11-26 MED ORDER — PERFLUTREN LIPID MICROSPHERE
1.0000 mL | INTRAVENOUS | Status: AC | PRN
  Administered 2018-11-26: 2 mL via INTRAVENOUS
  Filled 2018-11-26: qty 10

## 2018-11-26 MED ORDER — TRAMADOL HCL 50 MG PO TABS
50.0000 mg | ORAL_TABLET | Freq: Four times a day (QID) | ORAL | Status: DC | PRN
  Administered 2018-11-26 – 2018-11-27 (×3): 50 mg via ORAL
  Filled 2018-11-26 (×3): qty 1

## 2018-11-26 NOTE — Progress Notes (Signed)
PROGRESS NOTE    Melinda Woods  VZD:[id number] DOB: 01/18/1966 DOA: 11/25/2018 PCP: Leola Brazil, DO   Brief Narrative:  53 year old African-American female admitted overnight with shortness of breath consistent with CHF exacerbation.  She does have a history of diastolic dysfunction grade 1 as well as rheumatoid arthritis on methotrexate, asthma, hypertension, and morbid obesity.   Assessment & Plan:   Principal Problem:   Acute CHF (congestive heart failure) (HCC) Active Problems:   Chronic diastolic CHF (congestive heart failure) (HCC)   1. Acute on chronic diastolic CHF - 1. Continue CHF pathway 2. Continue 80mg  IV BID lasix 3. Tele monitor 4. 2d echo has been done read is pending 5. Strict intake and output 6. Daily BMP to monitor lites 7. DDx: 1. I agree COVID unlikely given 2 negative COVID tests this past week and lack of other COVID symptoms 2. No SIRS to suggest CAP, also not a lobar appearance on CXR 3. MTX pulmonary toxicity is a possibility but seems less likely given the wt gain and peripheral edema patient has.  If not improving on Lasix then would get HRCT as next step. 4. HS Trop neg 9/22, and 2x neg again today; no obvious ischemic EKG findings; also has CT coronary calcium score of 0 last year: ACS seems really unlikely. 2. HTN - continue home ARB / BB once med rec completed 3. Asthma: No evidence of wheezing on exam 4. Morbid obesity.  Patient received weight loss counseling she likely has an element of obesity hypoventilation superimposed on her asthma and acute on chronic diastolic CHF exacerbation.  DVT prophylaxis: Lovenox SQ  Code Status: full    Code Status Orders  (From admission, onward)         Start     Ordered   11/25/18 2231  Full code  Continuous     11/25/18 2232        Code Status History    Date Active Date Inactive Code Status Order ID Comments User Context   08/25/2017 1815 08/26/2017 1659 Full Code 10/27/2017  [id number], MD Inpatient   04/13/2017 1941 04/14/2017 1745 Full Code 04/16/2017  [id number], MD Inpatient   01/29/2017 2319 02/02/2017 1737 Full Code 14/09/2016  [id number], MD Inpatient   04/23/2016 1404 04/26/2016 1542 Full Code 06/26/2016  [id number], MD Inpatient   03/13/2014 1538 03/14/2014 2028 Full Code 2029  [id number], MD Inpatient   Advance Care Planning Activity     Family Communication: none today  Disposition Plan:   Patient remained inpatient requiring IV diuresis, electrolyte monitoring, without these treatments patient risk of life-threatening clinical deterioration Consults called: None Admission status: Inpatient   Consultants:   None  Procedures:  Dg Chest 2 View  Result Date: 11/18/2018 CLINICAL DATA:  Shortness of breath EXAM: CHEST - 2 VIEW COMPARISON:  October 19, 2018 FINDINGS: There is no edema or consolidation. Heart size and pulmonary vascularity are normal. No adenopathy. No bone lesions. IMPRESSION: No edema or consolidation. Electronically Signed   By: October 21, 2018 III M.D.   On: 11/18/2018 18:44   Dg Chest Port 1 View  Result Date: 11/25/2018 CLINICAL DATA:  Shortness of breath, chest pain with decreased O2 sats. EXAM: PORTABLE CHEST 1 VIEW COMPARISON:  11/18/2018 FINDINGS: Lung volumes are diminished. Heart size stable accounting for low lung volumes. Increased interstitial prominence with interval development of basilar perihilar opacities. IMPRESSION: Increasing airspace and interstitial changes suspicious for edema with concomitant volume loss. Multifocal  infection is also considered Electronically Signed   By: Donzetta KohutGeoffrey  Wile M.D.   On: 11/25/2018 16:39     Antimicrobials:   None   Subjective: Patient reports still feeling short of breath, less than 50% of baseline  Objective: Vitals:   11/25/18 1915 11/25/18 2143 11/26/18 0015 11/26/18 0415  BP: 105/72 (!) 127/94 122/88 120/78  Pulse: 85 90 91 (!) 104  Resp: 12 20 20 20   Temp:   98.4 F (36.9 C) 98.2 F (36.8 C) 98.6 F (37 C)  TempSrc:  Oral Oral Oral  SpO2: 100% 97% 95% 92%  Weight:  127.3 kg  126.3 kg  Height:  5\' 6"  (1.676 m)      Intake/Output Summary (Last 24 hours) at 11/26/2018 0911 Last data filed at 11/26/2018 0300 Gross per 24 hour  Intake 480 ml  Output 1400 ml  Net -920 ml   Filed Weights   11/25/18 1545 11/25/18 2143 11/26/18 0415  Weight: 127.9 kg 127.3 kg 126.3 kg    Examination:  General exam: Appears short of breath, not speaking in full sentences Respiratory system: Mild wheezing, mild rhonchi, Cardiovascular system: S1 & S2 heard, RRR. No JVD, murmurs, rubs, gallops or clicks. No pedal edema. Gastrointestinal system: Abdomen is nondistended, soft and nontender. No organomegaly or masses felt. Normal bowel sounds heard. Central nervous system: Alert and oriented. No focal neurological deficits. Extremities: Warm well perfused, plus pitting edema bilateral lower extremities Skin: No rashes, lesions or ulcers Psychiatry: Judgement and insight appear normal. Mood & affect appropriate.     Data Reviewed: I have personally reviewed following labs and imaging studies  CBC: Recent Labs  Lab 11/25/18 1557  WBC 5.7  HGB 12.6  HCT 40.7  MCV 94.2  PLT 240   Basic Metabolic Panel: Recent Labs  Lab 11/25/18 1557 11/26/18 0430  NA 137 138  K 3.9 3.5  CL 101 100  CO2 27 28  GLUCOSE 94 121*  BUN 15 11  CREATININE 0.76 0.83  CALCIUM 9.4 9.0   GFR: Estimated Creatinine Clearance: 109 mL/min (by C-G formula based on SCr of 0.83 mg/dL). Liver Function Tests: No results for input(s): AST, ALT, ALKPHOS, BILITOT, PROT, ALBUMIN in the last 168 hours. No results for input(s): LIPASE, AMYLASE in the last 168 hours. No results for input(s): AMMONIA in the last 168 hours. Coagulation Profile: No results for input(s): INR, PROTIME in the last 168 hours. Cardiac Enzymes: No results for input(s): CKTOTAL, CKMB, CKMBINDEX, TROPONINI in  the last 168 hours. BNP (last 3 results) No results for input(s): PROBNP in the last 8760 hours. HbA1C: No results for input(s): HGBA1C in the last 72 hours. CBG: No results for input(s): GLUCAP in the last 168 hours. Lipid Profile: No results for input(s): CHOL, HDL, LDLCALC, TRIG, CHOLHDL, LDLDIRECT in the last 72 hours. Thyroid Function Tests: No results for input(s): TSH, T4TOTAL, FREET4, T3FREE, THYROIDAB in the last 72 hours. Anemia Panel: No results for input(s): VITAMINB12, FOLATE, FERRITIN, TIBC, IRON, RETICCTPCT in the last 72 hours. Sepsis Labs: No results for input(s): PROCALCITON, LATICACIDVEN in the last 168 hours.  Recent Results (from the past 240 hour(s))  SARS Coronavirus 2 [facility](Hospital order, [facility]) Nasopharyngeal Nasopharyngeal Swab     Status: None   Collection Time: 11/25/18  6:31 PM   Specimen: Nasopharyngeal Swab  Result Value Ref Range Status   SARS Coronavirus 2 NEGATIVE NEGATIVE Final    Comment: (NOTE) If result is NEGATIVE SARS-CoV-2 target nucleic  acids are NOT DETECTED. The SARS-CoV-2 RNA is generally detectable in upper and lower  respiratory specimens during the acute phase of infection. The lowest  concentration of SARS-CoV-2 viral copies this assay can detect is 250  copies / mL. A negative result does not preclude SARS-CoV-2 infection  and should not be used as the sole basis for treatment or other  patient management decisions.  A negative result may occur with  improper specimen collection / handling, submission of specimen other  than nasopharyngeal swab, presence of viral mutation(s) within the  areas targeted by this assay, and inadequate number of viral copies  (<250 copies / mL). A negative result must be combined with clinical  observations, patient history, and epidemiological information. If result is POSITIVE SARS-CoV-2 target nucleic acids are DETECTED. The SARS-CoV-2 RNA is generally detectable in  upper and lower  respiratory specimens dur ing the acute phase of infection.  Positive  results are indicative of active infection with SARS-CoV-2.  Clinical  correlation with patient history and other diagnostic information is  necessary to determine patient infection status.  Positive results do  not rule out bacterial infection or co-infection with other viruses. If result is PRESUMPTIVE POSTIVE SARS-CoV-2 nucleic acids MAY BE PRESENT.   A presumptive positive result was obtained on the submitted specimen  and confirmed on repeat testing.  While 2019 novel coronavirus  (SARS-CoV-2) nucleic acids may be present in the submitted sample  additional confirmatory testing may be necessary for epidemiological  and / or clinical management purposes  to differentiate between  SARS-CoV-2 and other Sarbecovirus currently known to infect humans.  If clinically indicated additional testing with an alternate test  methodology [phone]) is advised. The SARS-CoV-2 RNA is generally  detectable in upper and lower respiratory sp ecimens during the acute  phase of infection. The expected result is Negative. Fact Sheet for Patients:  StrictlyIdeas.no Fact Sheet for Healthcare Providers: BankingDealers.co.za This test is not yet approved or cleared by the [place] FDA and has been authorized for detection and/or diagnosis of SARS-CoV-2 by FDA under an Emergency Use Authorization (EUA).  This EUA will remain in effect (meaning this test can be used) for the duration of the COVID-19 declaration under Section 564(b)(1) of the Act, 21 U.S.C. section 360bbb-3(b)(1), unless the authorization is terminated or revoked sooner. Performed at [facility], [address] [place], [place] [address]          Radiology Studies: Dg Chest [place] 1 View  Result Date: 11/25/2018 CLINICAL DATA:  Shortness of breath, chest pain with decreased O2 sats.  EXAM: PORTABLE CHEST 1 VIEW COMPARISON:  11/18/2018 FINDINGS: Lung volumes are diminished. Heart size stable accounting for low lung volumes. Increased interstitial prominence with interval development of basilar perihilar opacities. IMPRESSION: Increasing airspace and interstitial changes suspicious for edema with concomitant volume loss. Multifocal infection is also considered Electronically Signed   By: Zetta Bills M.D.   On: 11/25/2018 16:39        Scheduled Meds: . enoxaparin (LOVENOX) injection  40 mg Subcutaneous Q2000  . furosemide  80 mg Intravenous BID  . sodium chloride flush  3 mL Intravenous Once  . sodium chloride flush  3 mL Intravenous Q12H   Continuous Infusions: . sodium chloride       LOS: 1 day    Time spent: 35 min    Nicolette Bang, MD [facility]  If 7PM-7AM, please contact night-coverage  11/26/2018, 9:11 AM

## 2018-11-26 NOTE — Progress Notes (Signed)
This afternoon patient c/o CP 10/10,  VSS , 2 mg morphine given per order. MD notified see epic for new orders. Patient pain was relieved with x1 dose morphine. Will continue to monitor the patient.

## 2018-11-26 NOTE — Progress Notes (Signed)
  Echocardiogram 2D Echocardiogram has been performed.  Melinda Woods 11/26/2018, 8:54 AM

## 2018-11-27 ENCOUNTER — Encounter (HOSPITAL_BASED_OUTPATIENT_CLINIC_OR_DEPARTMENT_OTHER): Payer: Self-pay

## 2018-11-27 ENCOUNTER — Other Ambulatory Visit: Payer: Self-pay

## 2018-11-27 ENCOUNTER — Emergency Department (HOSPITAL_BASED_OUTPATIENT_CLINIC_OR_DEPARTMENT_OTHER)
Admission: EM | Admit: 2018-11-27 | Discharge: 2018-11-27 | Disposition: A | Discharge status: Home / Self Care | Payer: 59 | Attending: Emergency Medicine | Admitting: Emergency Medicine

## 2018-11-27 ENCOUNTER — Emergency Department (HOSPITAL_BASED_OUTPATIENT_CLINIC_OR_DEPARTMENT_OTHER): Payer: 59

## 2018-11-27 DIAGNOSIS — Z79899 Other long term (current) drug therapy: Secondary | ICD-10-CM | POA: Insufficient documentation

## 2018-11-27 DIAGNOSIS — Z7982 Long term (current) use of aspirin: Secondary | ICD-10-CM | POA: Insufficient documentation

## 2018-11-27 DIAGNOSIS — M069 Rheumatoid arthritis, unspecified: Secondary | ICD-10-CM | POA: Insufficient documentation

## 2018-11-27 DIAGNOSIS — Z8673 Personal history of transient ischemic attack (TIA), and cerebral infarction without residual deficits: Secondary | ICD-10-CM | POA: Insufficient documentation

## 2018-11-27 DIAGNOSIS — J45909 Unspecified asthma, uncomplicated: Secondary | ICD-10-CM | POA: Insufficient documentation

## 2018-11-27 DIAGNOSIS — I5032 Chronic diastolic (congestive) heart failure: Secondary | ICD-10-CM | POA: Insufficient documentation

## 2018-11-27 DIAGNOSIS — R0789 Other chest pain: Secondary | ICD-10-CM | POA: Diagnosis not present

## 2018-11-27 DIAGNOSIS — I110 Hypertensive heart disease with heart failure: Secondary | ICD-10-CM | POA: Insufficient documentation

## 2018-11-27 DIAGNOSIS — M797 Fibromyalgia: Secondary | ICD-10-CM | POA: Insufficient documentation

## 2018-11-27 DIAGNOSIS — Z91040 Latex allergy status: Secondary | ICD-10-CM | POA: Insufficient documentation

## 2018-11-27 LAB — BASIC METABOLIC PANEL
Anion gap: 9 (ref 5–15)
Anion gap: 11 (ref 5–15)
BUN: 9 mg/dL (ref 6–20)
BUN: 11 mg/dL (ref 6–20)
CO2: 29 mmol/L (ref 22–32)
CO2: 31 mmol/L (ref 22–32)
Calcium: 9.3 mg/dL (ref 8.9–10.3)
Calcium: 9.3 mg/dL (ref 8.9–10.3)
Chloride: 98 mmol/L (ref 98–111)
Chloride: 93 mmol/L — ABNORMAL LOW (ref 98–111)
Creatinine, Ser: 0.73 mg/dL (ref 0.44–1.00)
Creatinine, Ser: 0.79 mg/dL (ref 0.44–1.00)
GFR calc Af Amer: >60 mL/min (ref >60)
GFR calc Af Amer: >60 mL/min (ref >60)
GFR calc non Af Amer: >60 mL/min (ref >60)
GFR calc non Af Amer: >60 mL/min (ref >60)
Glucose, Bld: 108 mg/dL — ABNORMAL HIGH (ref 70–99)
Glucose, Bld: 104 mg/dL — ABNORMAL HIGH (ref 70–99)
Potassium: 3.5 mmol/L (ref 3.5–5.1)
Potassium: 3.2 mmol/L — ABNORMAL LOW (ref 3.5–5.1)
Sodium: 136 mmol/L (ref 135–145)
Sodium: 135 mmol/L (ref 135–145)

## 2018-11-27 LAB — CBC
HCT: 41.9 % (ref 36.0–46.0)
Hemoglobin: 13.3 g/dL (ref 12.0–15.0)
MCH: 29.2 pg (ref 26.0–34.0)
MCHC: 31.7 g/dL (ref 30.0–36.0)
MCV: 91.9 fL (ref 80.0–100.0)
Platelets: 243 10*3/uL (ref 150–400)
RBC: 4.56 MIL/uL (ref 3.87–5.11)
RDW: 13.9 % (ref 11.5–15.5)
WBC: 6.3 10*3/uL (ref 4.0–10.5)
nRBC: 0 % (ref 0.0–0.2)

## 2018-11-27 LAB — TROPONIN I (HIGH SENSITIVITY)
Troponin I (High Sensitivity): <2 ng/L (ref <18)
Troponin I (High Sensitivity): <2 ng/L (ref <18)

## 2018-11-27 LAB — BRAIN NATRIURETIC PEPTIDE: B Natriuretic Peptide: 12.3 pg/mL (ref 0.0–100.0)

## 2018-11-27 MED ORDER — ACETAMINOPHEN 500 MG PO TABS
1000.0000 mg | ORAL_TABLET | Freq: Once | ORAL | Status: AC
  Administered 2018-11-27: 1000 mg via ORAL
  Filled 2018-11-27: qty 2

## 2018-11-27 NOTE — ED Notes (Signed)
Pt stating her CP started after she was arguing with her husband.

## 2018-11-27 NOTE — Progress Notes (Signed)
Patient alert and oriented, denies pain, iv and tele removed, d/c instruction explain and given to the patient, all  Questions answered to the patient satisfaction. Pt. D/c home per order

## 2018-11-27 NOTE — ED Triage Notes (Signed)
Pt is here for CP in left upper chest with radiation into neck.  No sob, no n/v.  Pt was recently hospitalized due to CHF exacerbation.

## 2018-11-27 NOTE — Discharge Summary (Signed)
Physician Discharge Summary  Melinda Woods NOM:[id number] DOB: Nov 17, 1965 DOA: 11/25/2018  PCP: Leola Brazil, DO  Admit date: 11/25/2018 Discharge date: 11/27/2018  Admitted From: Inpatient Disposition: home  Recommendations for Outpatient Follow-up:  1. Follow up with PCP in 1-2 weeks   Home Health:No Equipment/Devices: None Discharge Condition:Stable CODE STATUS:Full code Diet recommendation: Cardiac diet  Brief/Interim Summary: 53 year old African-American female admitted overnight with shortness of breath consistent with CHF exacerbation.  She does have a history of diastolic dysfunction grade 1 as well as rheumatoid arthritis on methotrexate, asthma, hypertension, and morbid obesity.  Hospital course: CHF exacerbation with diastolic dysfunction and preserved systolic function. Patient underwent echocardiogram showing preserved EF 55 to 60% with diastolic dysfunction.  She was appropriate diuresis with Lasix 80 IV twice daily with monitoring of electrolytes, strict intake and output, daily weights.  Patient symptoms improved dramatically per her report on the day of discharge felt she was at her baseline request to be discharged home.  She reported breathing was at baseline her ambulation was at baseline with no further complications.  While in the hospital patient received a continued treatment and monitoring of her hypertension with continuation of home medications, she has a history of asthma without any evidence of acute exacerbation.  Finally patient is morbidly obese 544.  She received weight loss counseling. On day of discharge patient was hemodynamically stable, respiratory status stable, patient requests discharge home felt she was at baseline.  She will follow-up with her PCP continue her home medications include Lasix 40 mg p.o. daily.  Patient did receive dietary counseling and CHF treatment education for daily weights, limited p.o. intake, and salt  reduction.  Discharge Diagnoses:  Principal Problem:   Acute CHF (congestive heart failure) (HCC) Active Problems:   Chronic diastolic CHF (congestive heart failure) [facility])    Discharge Instructions  Discharge Instructions    (HEART FAILURE PATIENTS) Call MD:  Anytime you have any of the following symptoms: 1) 3 pound weight gain in 24 hours or 5 pounds in 1 week 2) shortness of breath, with or without a dry hacking cough 3) swelling in the hands, feet or stomach 4) if you have to sleep on extra pillows at night in order to breathe.   Complete by: As directed    Call MD for:  difficulty breathing, headache or visual disturbances   Complete by: As directed    Call MD for:  extreme fatigue   Complete by: As directed    Call MD for:  hives   Complete by: As directed    Call MD for:  persistant dizziness or light-headedness   Complete by: As directed    Call MD for:  persistant nausea and vomiting   Complete by: As directed    Call MD for:  redness, tenderness, or signs of infection (pain, swelling, redness, odor or green/yellow discharge around incision site)   Complete by: As directed    Call MD for:  severe uncontrolled pain   Complete by: As directed    Call MD for:  temperature >100.4   Complete by: As directed    Diet - low sodium heart healthy   Complete by: As directed    Increase activity slowly   Complete by: As directed      Allergies as of 11/27/2018      Reactions   Latex Rash      Medication List    TAKE these medications   acetaminophen 650 MG CR tablet Commonly known as:  TYLENOL Take 1,300 mg by mouth daily as needed for pain.   albuterol 108 (90 Base) MCG/ACT inhaler Commonly known as: VENTOLIN HFA Inhale 2 puffs into the lungs every 4 (four) hours as needed for wheezing or shortness of breath.   amitriptyline 100 MG tablet Commonly known as: ELAVIL Take 1 tablet (100 mg total) by mouth 2 (two) times daily.   amoxicillin 500 MG capsule Commonly  known as: AMOXIL Take 500 mg by mouth 3 (three) times daily.   aspirin EC 81 MG tablet Take 1 tablet (81 mg total) by mouth daily.   DULoxetine 60 MG capsule Commonly known as: CYMBALTA Take 60 mg by mouth daily.   furosemide 40 MG tablet Commonly known as: LASIX Take 1 tablet (40 mg total) by mouth daily.   HYDROcodone-acetaminophen 5-325 MG tablet Commonly known as: NORCO/VICODIN Take 1 tablet by mouth every 6 (six) hours as needed for moderate pain.   hydrOXYzine 25 MG tablet Commonly known as: ATARAX/VISTARIL Take 2 tablets (50 mg total) by mouth every 6 (six) hours as needed for anxiety.   ipratropium-albuterol 0.5-2.5 (3) MG/3ML Soln Commonly known as: DUONEB Take 3 mLs by nebulization 2 (two) times daily. What changed:   when to take this  reasons to take this   loratadine 10 MG tablet Commonly known as: CLARITIN Take 10 mg by mouth daily.   losartan 25 MG tablet Commonly known as: Cozaar Take 1 tablet (25 mg total) by mouth daily.   metoprolol tartrate 25 MG tablet Commonly known as: LOPRESSOR Take 1 tablet (25 mg total) by mouth 2 (two) times daily.   montelukast 10 MG tablet Commonly known as: SINGULAIR Take 1 tablet (10 mg total) by mouth daily.   polyethylene glycol 17 g packet Commonly known as: MIRALAX / GLYCOLAX Take 17 g by mouth daily as needed for mild constipation.   potassium chloride SA 20 MEQ tablet Commonly known as: KLOR-CON Take 1 tablet (20 mEq total) by mouth daily as needed (always take with the Furosemide).   pregabalin 200 MG capsule Commonly known as: LYRICA Take 200 mg by mouth 3 (three) times daily.   rOPINIRole 0.5 MG tablet Commonly known as: REQUIP Take 0.5 mg by mouth at bedtime.      Follow-up Information    Nicola Girt, DO.   Specialty: Internal Medicine Why: Office will call patient Contact information: [address] [place] [address] [phone]          Allergies   Allergen Reactions  . Latex Rash    Consultations:  None   Procedures/Studies: Dg Chest 2 View  Result Date: 11/18/2018 CLINICAL DATA:  Shortness of breath EXAM: CHEST - 2 VIEW COMPARISON:  October 19, 2018 FINDINGS: There is no edema or consolidation. Heart size and pulmonary vascularity are normal. No adenopathy. No bone lesions. IMPRESSION: No edema or consolidation. Electronically Signed   By: Lowella Grip III M.D.   On: 11/18/2018 18:44   Dg Chest Port 1 View  Result Date: 11/25/2018 CLINICAL DATA:  Shortness of breath, chest pain with decreased O2 sats. EXAM: PORTABLE CHEST 1 VIEW COMPARISON:  11/18/2018 FINDINGS: Lung volumes are diminished. Heart size stable accounting for low lung volumes. Increased interstitial prominence with interval development of basilar perihilar opacities. IMPRESSION: Increasing airspace and interstitial changes suspicious for edema with concomitant volume loss. Multifocal infection is also considered Electronically Signed   By: Zetta Bills M.D.   On: 11/25/2018 16:39  Subjective: Reports she feels much better, at baseline Requesting discharge  Discharge Exam: Vitals:   11/27/18 0411 11/27/18 0818  BP: 104/73 132/79  Pulse: 95 (!) 105  Resp: 18   Temp: 98.1 F (36.7 C)   SpO2: 96%    Vitals:   11/26/18 1943 11/27/18 0039 11/27/18 0411 11/27/18 0818  BP: 105/70 130/82 104/73 132/79  Pulse: 97 98 95 (!) 105  Resp: 18 20 18    Temp: 98.5 F (36.9 C) 98.5 F (36.9 C) 98.1 F (36.7 C)   TempSrc: Oral Oral Oral   SpO2: 90% 95% 96%   Weight:  125.6 kg    Height:        General: Pt is alert, awake, not in acute distress Cardiovascular: RRR, S1/S2 +, no rubs, no gallops Respiratory: CTA bilaterally, no wheezing, no rhonchi Abdominal: Soft, NT, ND, bowel sounds + Extremities: Trace edema with significant improvement, no cyanosis    The results of significant diagnostics from this hospitalization (including imaging,  microbiology, ancillary and laboratory) are listed below for reference.     Microbiology: Recent Results (from the past 240 hour(s))  SARS Coronavirus 2 [facility](Hospital order, Performed in [facility] lab) Nasopharyngeal Nasopharyngeal Swab     Status: None   Collection Time: 11/25/18  6:31 PM   Specimen: Nasopharyngeal Swab  Result Value Ref Range Status   SARS Coronavirus 2 NEGATIVE NEGATIVE Final    Comment: (NOTE) If result is NEGATIVE SARS-CoV-2 target nucleic acids are NOT DETECTED. The SARS-CoV-2 RNA is generally detectable in upper and lower  respiratory specimens during the acute phase of infection. The lowest  concentration of SARS-CoV-2 viral copies this assay can detect is 250  copies / mL. A negative result does not preclude SARS-CoV-2 infection  and should not be used as the sole basis for treatment or other  patient management decisions.  A negative result may occur with  improper specimen collection / handling, submission of specimen other  than nasopharyngeal swab, presence of viral mutation(s) within the  areas targeted by this assay, and inadequate number of viral copies  (<250 copies / mL). A negative result must be combined with clinical  observations, patient history, and epidemiological information. If result is POSITIVE SARS-CoV-2 target nucleic acids are DETECTED. The SARS-CoV-2 RNA is generally detectable in upper and lower  respiratory specimens dur ing the acute phase of infection.  Positive  results are indicative of active infection with SARS-CoV-2.  Clinical  correlation with patient history and other diagnostic information is  necessary to determine patient infection status.  Positive results do  not rule out bacterial infection or co-infection with other viruses. If result is PRESUMPTIVE POSTIVE SARS-CoV-2 nucleic acids MAY BE PRESENT.   A presumptive positive result was obtained on the submitted specimen  and confirmed on repeat testing.  While  2019 novel coronavirus  (SARS-CoV-2) nucleic acids may be present in the submitted sample  additional confirmatory testing may be necessary for epidemiological  and / or clinical management purposes  to differentiate between  SARS-CoV-2 and other Sarbecovirus currently known to infect humans.  If clinically indicated additional testing with an alternate test  methodology [phone]([id number]) is advised. The SARS-CoV-2 RNA is generally  detectable in upper and lower respiratory sp ecimens during the acute  phase of infection. The expected result is Negative. Fact Sheet for Patients:  BoilerBrush.com.cyhttps://www.fda.gov/media/136312/download Fact Sheet for Healthcare Providers: https://pope.com/https://www.fda.gov/media/136313/download This test is not yet approved or cleared by the [place] FDA and has been authorized  for detection and/or diagnosis of SARS-CoV-2 by FDA under an Emergency Use Authorization (EUA).  This EUA will remain in effect (meaning this test can be used) for the duration of the COVID-19 declaration under Section 564(b)(1) of the Act, 21 U.S.C. section 360bbb-3(b)(1), unless the authorization is terminated or revoked sooner. Performed at [facility], [address] [place], [place] [address]      Labs: BNP (last 3 results) Recent Labs    10/19/18 0750 11/18/18 1919 11/25/18 1557  BNP 20.4 17.7 21.9   Basic Metabolic Panel: Recent Labs  Lab 11/25/18 1557 11/26/18 0430 11/27/18 0421  NA 137 138 136  K 3.9 3.5 3.5  CL 101 100 98  CO2 27 28 29   GLUCOSE 94 121* 108*  BUN 15 11 9   CREATININE 0.76 0.83 0.73  CALCIUM 9.4 9.0 9.3   Liver Function Tests: No results for input(s): AST, ALT, ALKPHOS, BILITOT, PROT, ALBUMIN in the last 168 hours. No results for input(s): LIPASE, AMYLASE in the last 168 hours. No results for input(s): AMMONIA in the last 168 hours. CBC: Recent Labs  Lab 11/25/18 1557  WBC 5.7  HGB 12.6  HCT 40.7  MCV 94.2  PLT 240   Cardiac Enzymes: No  results for input(s): CKTOTAL, CKMB, CKMBINDEX, TROPONINI in the last 168 hours. BNP: Invalid input(s): POCBNP CBG: Recent Labs  Lab 11/26/18 1254  GLUCAP 107*   D-Dimer No results for input(s): DDIMER in the last 72 hours. Hgb A1c No results for input(s): HGBA1C in the last 72 hours. Lipid Profile No results for input(s): CHOL, HDL, LDLCALC, TRIG, CHOLHDL, LDLDIRECT in the last 72 hours. Thyroid function studies No results for input(s): TSH, T4TOTAL, T3FREE, THYROIDAB in the last 72 hours.  Invalid input(s): FREET3 Anemia work up No results for input(s): VITAMINB12, FOLATE, FERRITIN, TIBC, IRON, RETICCTPCT in the last 72 hours. Urinalysis    Component Value Date/Time   COLORURINE YELLOW 11/18/2018 1954   APPEARANCEUR CLEAR 11/18/2018 1954   LABSPEC 1.020 11/18/2018 1954   PHURINE 6.0 11/18/2018 1954   GLUCOSEU NEGATIVE 11/18/2018 1954   HGBUR NEGATIVE 11/18/2018 1954   BILIRUBINUR NEGATIVE 11/18/2018 1954   KETONESUR NEGATIVE 11/18/2018 1954   PROTEINUR NEGATIVE 11/18/2018 1954   UROBILINOGEN 1.0 03/13/2014 1222   NITRITE NEGATIVE 11/18/2018 1954   LEUKOCYTESUR NEGATIVE 11/18/2018 1954   Sepsis Labs Invalid input(s): PROCALCITONIN,  WBC,  LACTICIDVEN Microbiology Recent Results (from the past 240 hour(s))  SARS Coronavirus 2 [facility] order, Performed in [facility] hospital lab) Nasopharyngeal Nasopharyngeal Swab     Status: None   Collection Time: 11/25/18  6:31 PM   Specimen: Nasopharyngeal Swab  Result Value Ref Range Status   SARS Coronavirus 2 NEGATIVE NEGATIVE Final    Comment: (NOTE) If result is NEGATIVE SARS-CoV-2 target nucleic acids are NOT DETECTED. The SARS-CoV-2 RNA is generally detectable in upper and lower  respiratory specimens during the acute phase of infection. The lowest  concentration of SARS-CoV-2 viral copies this assay can detect is 250  copies / mL. A negative result does not preclude SARS-CoV-2 infection  and should not be used as the  sole basis for treatment or other  patient management decisions.  A negative result may occur with  improper specimen collection / handling, submission of specimen other  than nasopharyngeal swab, presence of viral mutation(s) within the  areas targeted by this assay, and inadequate number of viral copies  (<250 copies / mL). A negative result must be combined with clinical  observations, patient history, and epidemiological information. If result is POSITIVE SARS-CoV-2 target nucleic acids are DETECTED. The SARS-CoV-2 RNA is generally detectable in upper and lower  respiratory specimens dur ing the acute phase of infection.  Positive  results are indicative of active infection with SARS-CoV-2.  Clinical  correlation with patient history and other diagnostic information is  necessary to determine patient infection status.  Positive results do  not rule out bacterial infection or co-infection with other viruses. If result is PRESUMPTIVE POSTIVE SARS-CoV-2 nucleic acids MAY BE PRESENT.   A presumptive positive result was obtained on the submitted specimen  and confirmed on repeat testing.  While 2019 novel coronavirus  (SARS-CoV-2) nucleic acids may be present in the submitted sample  additional confirmatory testing may be necessary for epidemiological  and / or clinical management purposes  to differentiate between  SARS-CoV-2 and other Sarbecovirus currently known to infect humans.  If clinically indicated additional testing with an alternate test  methodology [phone]([id number]) is advised. The SARS-CoV-2 RNA is generally  detectable in upper and lower respiratory sp ecimens during the acute  phase of infection. The expected result is Negative. Fact Sheet for Patients:  BoilerBrush.com.cyhttps://www.fda.gov/media/136312/download Fact Sheet for Healthcare Providers: https://pope.com/https://www.fda.gov/media/136313/download This test is not yet approved or cleared by the [place] FDA and has been authorized for detection  and/or diagnosis of SARS-CoV-2 by FDA under an Emergency Use Authorization (EUA).  This EUA will remain in effect (meaning this test can be used) for the duration of the COVID-19 declaration under Section 564(b)(1) of the Act, 21 U.S.C. section 360bbb-3(b)(1), unless the authorization is terminated or revoked sooner. Performed at [facility] [place], [address] [place], [place] [address]      Time coordinating discharge: Over 30 minutes  SIGNED:   Burke Keelshristopher , MD  [facility] 11/27/2018, 11:13 AM Pager   If 7PM-7AM, please contact night-coverage [email or web address] Password [email or web address]

## 2018-11-27 NOTE — ED Provider Notes (Signed)
[facility] EMERGENCY DEPARTMENT Provider Note   CSN: [id number] Arrival date & time: 11/27/18  1827     History   Chief Complaint Chief Complaint  Patient presents with   Chest Pain    HPI Melinda Woods is a 53 y.o. female.     Patient is a 53 year old female who presents with chest pain.  She has a history of CHF, hypertension, fibromyalgia and rheumatoid arthritis.  She was recently admitted to the hospital for CHF exacerbation and was diuresed.  She was admitted from September 29 until today.  She was discharged this afternoon.  She went home and her husband was wondering where some of his money on his card was.  She got into an argument and started having chest pain.  This started about 2 hours ago.  She says it eased off now.  She denies any associated shortness of breath.  No nausea or vomiting.  No diaphoresis.  It is on her left side of her chest and radiates up toward her shoulder.  She says her leg swelling has greatly improved since she has been in the hospital.  No recent fevers.     Past Medical History:  Diagnosis Date   Anxiety    Asthma    CHF (congestive heart failure) (HCC)    grade 1 dd on echo 03/2016   Fatty liver    Fibromyalgia    Hypertension    Neuropathy    Ovarian cyst    RA (rheumatoid arthritis) [facility](HCC)     Patient Active Problem List   Diagnosis Date Noted   Acute CHF (congestive heart failure) (HCC) 11/25/2018   Musculoskeletal chest pain 08/26/2017   Asthma exacerbation 04/13/2017   Asthma exacerbation attacks 04/13/2017   Hypokalemia 04/13/2017   Acute bronchitis 01/29/2017   Depression 01/29/2017   SIRS (systemic inflammatory response syndrome) (HCC) 01/29/2017   RA (rheumatoid arthritis) (HCC)    Acute respiratory failure (HCC) 04/26/2016   Fatty liver 04/25/2016   Hypertension 04/23/2016   Chronic diastolic CHF (congestive heart failure) (HCC) 04/23/2016   Morbid obesity (HCC) 04/23/2016    Peripheral neuropathy 04/23/2016   Right sided weakness 03/13/2014   TIA (transient ischemic attack) 03/13/2014    Past Surgical History:  Procedure Laterality Date   TUBAL LIGATION       OB History   No obstetric history on file.      Home Medications    Prior to Admission medications   Medication Sig Start Date End Date Taking? Authorizing Provider  amitriptyline (ELAVIL) 100 MG tablet Take 1 tablet (100 mg total) by mouth 2 (two) times daily. 04/26/16  Yes Calvert Cantorizwan, Saima, MD  acetaminophen (TYLENOL) 650 MG CR tablet Take 1,300 mg by mouth daily as needed for pain.    [provider]  albuterol (VENTOLIN HFA) 108 (90 Base) MCG/ACT inhaler Inhale 2 puffs into the lungs every 4 (four) hours as needed for wheezing or shortness of breath. 10/19/18   Derwood KaplanNanavati, Ankit, MD  aspirin EC 81 MG tablet Take 1 tablet (81 mg total) by mouth daily. 08/26/17   Delano MetzSchertz, Robert, MD  DULoxetine (CYMBALTA) 60 MG capsule Take 60 mg by mouth daily. 08/16/17   [provider]  furosemide (LASIX) 40 MG tablet Take 1 tablet (40 mg total) by mouth daily. 08/27/17   Delano MetzSchertz, Robert, MD  HYDROcodone-acetaminophen (NORCO/VICODIN) 5-325 MG tablet Take 1 tablet by mouth every 6 (six) hours as needed for moderate pain.    [provider]  hydrOXYzine (ATARAX/VISTARIL) 25 MG tablet Take 2 tablets (50 mg total) by mouth every 6 (six) hours as needed for anxiety. 02/22/17   Alvira Monday, MD  ipratropium-albuterol (DUONEB) 0.5-2.5 (3) MG/3ML SOLN Take 3 mLs by nebulization 2 (two) times daily. Patient taking differently: Take 3 mLs by nebulization 2 (two) times daily as needed (shortness of breath/wheezing).  02/02/17   Kathlen Mody, MD  loratadine (CLARITIN) 10 MG tablet Take 10 mg by mouth daily. 09/01/18   [provider]  losartan (COZAAR) 25 MG tablet Take 1 tablet (25 mg total) by mouth daily. 04/26/16   Calvert Cantor, MD  metoprolol tartrate (LOPRESSOR) 25 MG tablet Take 1 tablet  (25 mg total) by mouth 2 (two) times daily. 04/26/16   Calvert Cantor, MD  montelukast (SINGULAIR) 10 MG tablet Take 1 tablet (10 mg total) by mouth daily. 08/26/17 11/26/18  Delano Metz, MD  polyethylene glycol (MIRALAX / Ethelene Hal) packet Take 17 g by mouth daily as needed for mild constipation. 04/26/16   Calvert Cantor, MD  potassium chloride SA (K-DUR,KLOR-CON) 20 MEQ tablet Take 1 tablet (20 mEq total) by mouth daily as needed (always take with the Furosemide). 04/26/16   Calvert Cantor, MD  pregabalin (LYRICA) 200 MG capsule Take 200 mg by mouth 3 (three) times daily. 11/11/18   [provider]  rOPINIRole (REQUIP) 0.5 MG tablet Take 0.5 mg by mouth at bedtime. 11/11/18   [provider]    Family History Family History  Problem Relation Age of Onset   Stroke Mother    Diabetes Mellitus II Mother    Lung cancer Father     Social History Social History   Tobacco Use   Smoking status: Never Smoker   Smokeless tobacco: Never Used  Substance Use Topics   Alcohol use: No   Drug use: No     Allergies   Latex   Review of Systems Review of Systems  Constitutional: Negative for chills, diaphoresis, fatigue and fever.  HENT: Negative for congestion, rhinorrhea and sneezing.   Eyes: Negative.   Respiratory: Negative for cough, chest tightness and shortness of breath.   Cardiovascular: Positive for chest pain. Negative for leg swelling.  Gastrointestinal: Negative for abdominal pain, blood in stool, diarrhea, nausea and vomiting.  Genitourinary: Negative for difficulty urinating, flank pain, frequency and hematuria.  Musculoskeletal: Negative for arthralgias and back pain.  Skin: Negative for rash.  Neurological: Negative for dizziness, speech difficulty, weakness, numbness and headaches.     Physical Exam Updated Vital Signs BP 116/76 (BP Location: Right Arm)    Pulse 98    Temp 98.5 F (36.9 C) (Oral)    Resp 20    Ht 5\' 6"  (1.676 m)    Wt 125.6 kg    SpO2  96%    BMI 44.71 kg/m   Physical Exam Constitutional:      Appearance: She is well-developed.  HENT:     Head: Normocephalic and atraumatic.  Eyes:     Pupils: Pupils are equal, round, and reactive to light.  Neck:     Musculoskeletal: Normal range of motion and neck supple.  Cardiovascular:     Rate and Rhythm: Normal rate and regular rhythm.     Heart sounds: Normal heart sounds.  Pulmonary:     Effort: Pulmonary effort is normal. No respiratory distress.     Breath sounds: Normal breath sounds. No wheezing or rales.  Chest:     Chest wall: Tenderness (Positive tenderness on palpation of  the left chest wall, no crepitus) present.  Abdominal:     General: Bowel sounds are normal.     Palpations: Abdomen is soft.     Tenderness: There is no abdominal tenderness. There is no guarding or rebound.  Musculoskeletal: Normal range of motion.     Right lower leg: Edema present.     Left lower leg: Edema present.     Comments: 1+ pain edema to lower extremities bilaterally  Lymphadenopathy:     Cervical: No cervical adenopathy.  Skin:    General: Skin is warm and dry.     Findings: No rash.  Neurological:     Mental Status: She is alert and oriented to person, place, and time.      ED Treatments / Results  Labs (all labs ordered are listed, but only abnormal results are displayed) Labs Reviewed  BASIC METABOLIC PANEL - Abnormal; Notable for the following components:      Result Value   Potassium 3.2 (*)    Chloride 93 (*)    Glucose, Bld 104 (*)    All other components within normal limits  CBC  BRAIN NATRIURETIC PEPTIDE  PREGNANCY, URINE  TROPONIN I (HIGH SENSITIVITY)  TROPONIN I (HIGH SENSITIVITY)    EKG None  Radiology Dg Chest 2 View  Result Date: 11/27/2018 CLINICAL DATA:  Left upper chest pain. Hospital discharge earlier today for same. EXAM: CHEST - 2 VIEW COMPARISON:  Radiograph 11/25/2018 FINDINGS: Improved aeration from prior exam with improving lung  volumes. Decreased interstitial prominence and bibasilar opacities. Mild residual central peribronchial thickening. No new airspace disease, pleural effusion, or pneumothorax. Trace fluid in the fissures. IMPRESSION: Improving lung volumes with improving bibasilar opacities. Improved pulmonary edema, mild residual peribronchial thickening may be bronchitic or congestive. No new abnormality. Electronically Signed   By: Keith Rake M.D.   On: 11/27/2018 19:35    Procedures Procedures (including critical care time)  Medications Ordered in ED Medications  acetaminophen (TYLENOL) tablet 1,000 mg (has no administration in time range)     Initial Impression / Assessment and Plan / ED Course  I have reviewed the triage vital signs and the nursing notes.  Pertinent labs & imaging results that were available during my care of the patient were reviewed by me and considered in my medical decision making (see chart for details).        Patient is a 53 year old female who presents with chest pain.  It started after she got into an argument with her husband.  It is reproducible on palpation and does not seem to be consistent with ACS.  Her EKG does not show any ischemic changes.  Her chest x-ray is markedly improved from her prior x-rays with no new infiltrates or pulmonary edema.  Her BNP is normal.  Her troponin x2 is negative.  She does not have symptoms that would be more concerning for PE.  She was given some Tylenol for pain.  I feel it is likely musculoskeletal.  She was discharged home in good condition.  She was encouraged to follow-up with her PCP.  Return precautions were given.  Final Clinical Impressions(s) / ED Diagnoses   Final diagnoses:  Atypical chest pain    ED Discharge Orders    None       Malvin Johns, MD 11/27/18 2155

## 2019-03-05 ENCOUNTER — Other Ambulatory Visit: Payer: Self-pay

## 2019-03-05 ENCOUNTER — Emergency Department (HOSPITAL_BASED_OUTPATIENT_CLINIC_OR_DEPARTMENT_OTHER)
Admission: EM | Admit: 2019-03-05 | Discharge: 2019-03-05 | Disposition: A | Discharge status: Home / Self Care | Payer: 59 | Attending: Emergency Medicine | Admitting: Emergency Medicine

## 2019-03-05 ENCOUNTER — Encounter (HOSPITAL_BASED_OUTPATIENT_CLINIC_OR_DEPARTMENT_OTHER): Payer: Self-pay | Admitting: Emergency Medicine

## 2019-03-05 DIAGNOSIS — M069 Rheumatoid arthritis, unspecified: Secondary | ICD-10-CM | POA: Insufficient documentation

## 2019-03-05 DIAGNOSIS — M79673 Pain in unspecified foot: Secondary | ICD-10-CM | POA: Diagnosis present

## 2019-03-05 DIAGNOSIS — G609 Hereditary and idiopathic neuropathy, unspecified: Secondary | ICD-10-CM

## 2019-03-05 DIAGNOSIS — Z79899 Other long term (current) drug therapy: Secondary | ICD-10-CM | POA: Diagnosis not present

## 2019-03-05 DIAGNOSIS — Z7982 Long term (current) use of aspirin: Secondary | ICD-10-CM | POA: Insufficient documentation

## 2019-03-05 DIAGNOSIS — J45909 Unspecified asthma, uncomplicated: Secondary | ICD-10-CM | POA: Diagnosis not present

## 2019-03-05 DIAGNOSIS — I5032 Chronic diastolic (congestive) heart failure: Secondary | ICD-10-CM | POA: Diagnosis not present

## 2019-03-05 DIAGNOSIS — G9009 Other idiopathic peripheral autonomic neuropathy: Secondary | ICD-10-CM | POA: Insufficient documentation

## 2019-03-05 DIAGNOSIS — I110 Hypertensive heart disease with heart failure: Secondary | ICD-10-CM | POA: Insufficient documentation

## 2019-03-05 MED ORDER — KETOROLAC TROMETHAMINE 60 MG/2ML IM SOLN
60.0000 mg | Freq: Once | INTRAMUSCULAR | Status: AC
  Administered 2019-03-05: 60 mg via INTRAMUSCULAR
  Filled 2019-03-05: qty 2

## 2019-03-05 NOTE — ED Provider Notes (Signed)
[facility] EMERGENCY DEPARTMENT Provider Note   CSN: [id number] Arrival date & time: 03/05/19  1815     History Chief Complaint  Patient presents with  . Foot Pain    Melinda Woods is a 54 y.o. female.  With a past medical history of chronic peripheral neuropathy, fibromyalgia, multiple teeth pain complaints.  She has been taking gabapentin at home without any relief of her symptoms.  Patient states that she has been seen at various locations in the past and states "I just need a shot."  She is not sure what she is gotten in the past but states it was very helpful.  She denies any new symptoms.  HPI     Past Medical History:  Diagnosis Date  . Anxiety   . Asthma   . CHF (congestive heart failure) (HCC)    grade 1 dd on echo 03/2016  . Fatty liver   . Fibromyalgia   . Hypertension   . Neuropathy   . Ovarian cyst   . RA (rheumatoid arthritis) [facility])     Patient Active Problem List   Diagnosis Date Noted  . Acute CHF (congestive heart failure) (HCC) 11/25/2018  . Musculoskeletal chest pain 08/26/2017  . Asthma exacerbation 04/13/2017  . Asthma exacerbation attacks 04/13/2017  . Hypokalemia 04/13/2017  . Acute bronchitis 01/29/2017  . Depression 01/29/2017  . SIRS (systemic inflammatory response syndrome) (HCC) 01/29/2017  . RA (rheumatoid arthritis) (HCC)   . Acute respiratory failure (HCC) 04/26/2016  . Fatty liver 04/25/2016  . Hypertension 04/23/2016  . Chronic diastolic CHF (congestive heart failure) (HCC) 04/23/2016  . Morbid obesity (HCC) 04/23/2016  . Peripheral neuropathy 04/23/2016  . Right sided weakness 03/13/2014  . TIA (transient ischemic attack) 03/13/2014    Past Surgical History:  Procedure Laterality Date  . TUBAL LIGATION       OB History   No obstetric history on file.     Family History  Problem Relation Age of Onset  . Stroke Mother   . Diabetes Mellitus II Mother   . Lung cancer Father     Social History   Tobacco  Use  . Smoking status: Never Smoker  . Smokeless tobacco: Never Used  Substance Use Topics  . Alcohol use: No  . Drug use: No    Home Medications Prior to Admission medications   Medication Sig Start Date End Date Taking? Authorizing Provider  acetaminophen (TYLENOL) 650 MG CR tablet Take 1,300 mg by mouth daily as needed for pain.    [provider]  albuterol (VENTOLIN HFA) 108 (90 Base) MCG/ACT inhaler Inhale 2 puffs into the lungs every 4 (four) hours as needed for wheezing or shortness of breath. 10/19/18   Derwood Kaplan, MD  amitriptyline (ELAVIL) 100 MG tablet Take 1 tablet (100 mg total) by mouth 2 (two) times daily. 04/26/16   Calvert Cantor, MD  aspirin EC 81 MG tablet Take 1 tablet (81 mg total) by mouth daily. 08/26/17   Delano Metz, MD  DULoxetine (CYMBALTA) 60 MG capsule Take 60 mg by mouth daily. 08/16/17   [provider]  furosemide (LASIX) 40 MG tablet Take 1 tablet (40 mg total) by mouth daily. 08/27/17   Delano Metz, MD  HYDROcodone-acetaminophen (NORCO/VICODIN) 5-325 MG tablet Take 1 tablet by mouth every 6 (six) hours as needed for moderate pain.    [provider]  hydrOXYzine (ATARAX/VISTARIL) 25 MG tablet Take 2 tablets (50 mg total) by mouth every 6 (six) hours as needed  for anxiety. 02/22/17   Gareth Morgan, MD  ipratropium-albuterol (DUONEB) 0.5-2.5 (3) MG/3ML SOLN Take 3 mLs by nebulization 2 (two) times daily. Patient taking differently: Take 3 mLs by nebulization 2 (two) times daily as needed (shortness of breath/wheezing).  02/02/17   Hosie Poisson, MD  loratadine (CLARITIN) 10 MG tablet Take 10 mg by mouth daily. 09/01/18   [provider]  losartan (COZAAR) 25 MG tablet Take 1 tablet (25 mg total) by mouth daily. 04/26/16   Debbe Odea, MD  metoprolol tartrate (LOPRESSOR) 25 MG tablet Take 1 tablet (25 mg total) by mouth 2 (two) times daily. 04/26/16   Debbe Odea, MD  montelukast (SINGULAIR) 10 MG tablet Take 1 tablet (10  mg total) by mouth daily. 08/26/17 11/26/18  Roney Jaffe, MD  polyethylene glycol (MIRALAX / Floria Raveling) packet Take 17 g by mouth daily as needed for mild constipation. 04/26/16   Debbe Odea, MD  potassium chloride SA (K-DUR,KLOR-CON) 20 MEQ tablet Take 1 tablet (20 mEq total) by mouth daily as needed (always take with the Furosemide). 04/26/16   Debbe Odea, MD  pregabalin (LYRICA) 200 MG capsule Take 200 mg by mouth 3 (three) times daily. 11/11/18   [provider]  rOPINIRole (REQUIP) 0.5 MG tablet Take 0.5 mg by mouth at bedtime. 11/11/18   [provider]    Allergies    Latex  Review of Systems   Review of Systems Ten systems reviewed and are negative for acute change, except as noted in the HPI.   Physical Exam Updated Vital Signs BP (!) 93/57   Pulse 85   Temp 99.1 F (37.3 C) (Oral)   Resp 19   Ht 5\' 6"  (1.676 m)   Wt 120.2 kg   SpO2 96%   BMI 42.77 kg/m   Physical Exam Vitals and nursing note reviewed.  Constitutional:      General: She is not in acute distress.    Appearance: She is well-developed. She is not diaphoretic.  HENT:     Head: Normocephalic and atraumatic.  Eyes:     General: No scleral icterus.    Conjunctiva/sclera: Conjunctivae normal.  Cardiovascular:     Rate and Rhythm: Normal rate and regular rhythm.     Pulses:          Dorsalis pedis pulses are 2+ on the right side and 2+ on the left side.       Posterior tibial pulses are 2+ on the right side and 2+ on the left side.     Heart sounds: Normal heart sounds. No murmur. No friction rub. No gallop.   Pulmonary:     Effort: Pulmonary effort is normal. No respiratory distress.     Breath sounds: Normal breath sounds.  Abdominal:     General: Bowel sounds are normal. There is no distension.     Palpations: Abdomen is soft. There is no mass.     Tenderness: There is no abdominal tenderness. There is no guarding.  Musculoskeletal:     Cervical back: Normal range of motion.   Skin:    General: Skin is warm and dry.  Neurological:     Mental Status: She is alert and oriented to person, place, and time.  Psychiatric:        Behavior: Behavior normal.     ED Results / Procedures / Treatments   Labs (all labs ordered are listed, but only abnormal results are displayed) Labs Reviewed - No data to display  EKG None  Radiology No results found.  Procedures Procedures (including critical care time)  Medications Ordered in ED Medications  ketorolac (TORADOL) injection 60 mg (60 mg Intramuscular Given 03/05/19 1948)    ED Course  I have reviewed the triage vital signs and the nursing notes.  Pertinent labs & imaging results that were available during my care of the patient were reviewed by me and considered in my medical decision making (see chart for details).    MDM Rules/Calculators/A&P                      Patient here with acute on chronic neuropathic pain. I have reviewed the chart and patient has no hx of renal insufficiency. Patient given a shot of toradol for pain relief. No evidence of emergent cause such as trama, DVT, arterial occlusion. Patient appears appropriate for dc with close OP f/u and pain mgmt, HDS. Discussed return precautions  Final Clinical Impression(s) / ED Diagnoses Final diagnoses:  Idiopathic peripheral neuropathy    Rx / DC Orders ED Discharge Orders    None       Arthor Captain, PA-C 03/07/19 2482    Rolan Bucco, MD 03/18/19 1115

## 2019-03-05 NOTE — ED Triage Notes (Signed)
Bilateral foot pain x 2 days. Hx of neuropathy. Takes Neurontin but states it is not working. She is tearful .

## 2019-03-05 NOTE — Discharge Instructions (Signed)
Get help right away if: You have an injury or infection that is not healing normally. You develop new weakness in an arm or leg. You fall frequently.

## 2019-12-02 IMAGING — DX DG CHEST 1V PORT
1 series · 1 of 1 positions shown · non-contrast
Comparison: 11/18/2018

CLINICAL DATA: Shortness of breath, chest pain with decreased O2
sats.

EXAM:
PORTABLE CHEST 1 VIEW

[chest ap]
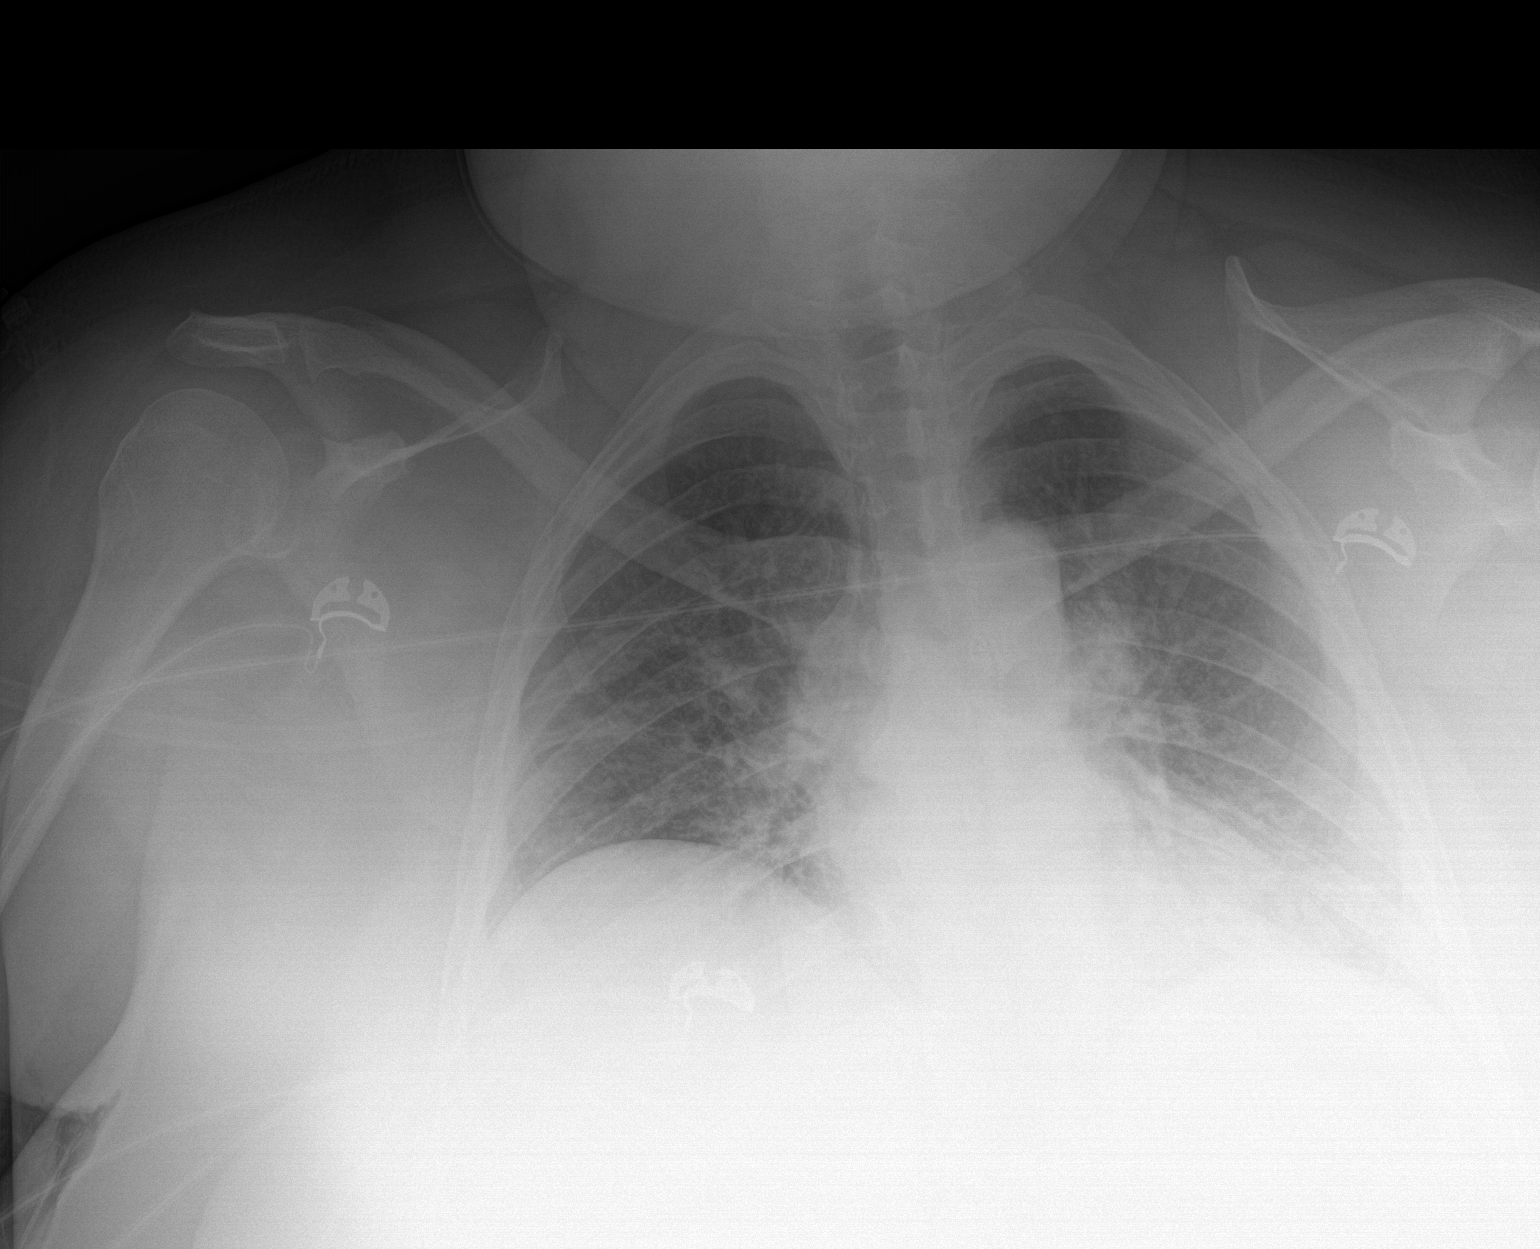

[1 of 1 positions shown; findings below may reference images not displayed]

FINDINGS: Lung volumes are diminished. Heart size stable accounting for low
lung volumes. Increased interstitial prominence with interval
development of basilar perihilar opacities.
IMPRESSION: Increasing airspace and interstitial changes suspicious for edema
with concomitant volume loss.

Multifocal infection is also considered

## 2019-12-28 ENCOUNTER — Other Ambulatory Visit: Payer: Self-pay

## 2019-12-28 ENCOUNTER — Encounter (HOSPITAL_BASED_OUTPATIENT_CLINIC_OR_DEPARTMENT_OTHER): Payer: Self-pay | Admitting: *Deleted

## 2019-12-28 ENCOUNTER — Emergency Department (HOSPITAL_BASED_OUTPATIENT_CLINIC_OR_DEPARTMENT_OTHER): Payer: 59

## 2019-12-28 ENCOUNTER — Emergency Department (HOSPITAL_BASED_OUTPATIENT_CLINIC_OR_DEPARTMENT_OTHER)
Admission: EM | Admit: 2019-12-28 | Discharge: 2019-12-28 | Disposition: A | Discharge status: Home / Self Care | Payer: 59 | Attending: Emergency Medicine | Admitting: Emergency Medicine

## 2019-12-28 DIAGNOSIS — R0602 Shortness of breath: Secondary | ICD-10-CM | POA: Insufficient documentation

## 2019-12-28 DIAGNOSIS — M25512 Pain in left shoulder: Secondary | ICD-10-CM | POA: Diagnosis not present

## 2019-12-28 DIAGNOSIS — Z7982 Long term (current) use of aspirin: Secondary | ICD-10-CM | POA: Insufficient documentation

## 2019-12-28 DIAGNOSIS — R7989 Other specified abnormal findings of blood chemistry: Secondary | ICD-10-CM

## 2019-12-28 DIAGNOSIS — Z8673 Personal history of transient ischemic attack (TIA), and cerebral infarction without residual deficits: Secondary | ICD-10-CM | POA: Diagnosis not present

## 2019-12-28 DIAGNOSIS — I5032 Chronic diastolic (congestive) heart failure: Secondary | ICD-10-CM | POA: Insufficient documentation

## 2019-12-28 DIAGNOSIS — Z91040 Latex allergy status: Secondary | ICD-10-CM | POA: Insufficient documentation

## 2019-12-28 DIAGNOSIS — Z79899 Other long term (current) drug therapy: Secondary | ICD-10-CM | POA: Diagnosis not present

## 2019-12-28 DIAGNOSIS — I110 Hypertensive heart disease with heart failure: Secondary | ICD-10-CM | POA: Diagnosis not present

## 2019-12-28 DIAGNOSIS — R202 Paresthesia of skin: Secondary | ICD-10-CM | POA: Insufficient documentation

## 2019-12-28 DIAGNOSIS — J45901 Unspecified asthma with (acute) exacerbation: Secondary | ICD-10-CM | POA: Insufficient documentation

## 2019-12-28 DIAGNOSIS — R079 Chest pain, unspecified: Secondary | ICD-10-CM | POA: Insufficient documentation

## 2019-12-28 LAB — CBC WITH DIFFERENTIAL/PLATELET
Abs Immature Granulocytes: 0.01 10*3/uL (ref 0.00–0.07)
Basophils Absolute: 0 10*3/uL (ref 0.0–0.1)
Basophils Relative: 0 %
Eosinophils Absolute: 0.1 10*3/uL (ref 0.0–0.5)
Eosinophils Relative: 1 %
HCT: 40.6 % (ref 36.0–46.0)
Hemoglobin: 12.9 g/dL (ref 12.0–15.0)
Immature Granulocytes: 0 %
Lymphocytes Relative: 43 %
Lymphs Abs: 3.1 10*3/uL (ref 0.7–4.0)
MCH: 29.5 pg (ref 26.0–34.0)
MCHC: 31.8 g/dL (ref 30.0–36.0)
MCV: 92.9 fL (ref 80.0–100.0)
Monocytes Absolute: 0.5 10*3/uL (ref 0.1–1.0)
Monocytes Relative: 6 %
Neutro Abs: 3.7 10*3/uL (ref 1.7–7.7)
Neutrophils Relative %: 50 %
Platelets: 277 10*3/uL (ref 150–400)
RBC: 4.37 MIL/uL (ref 3.87–5.11)
RDW: 13.2 % (ref 11.5–15.5)
WBC: 7.4 10*3/uL (ref 4.0–10.5)
nRBC: 0 % (ref 0.0–0.2)

## 2019-12-28 LAB — COMPREHENSIVE METABOLIC PANEL
ALT: 15 U/L (ref 0–44)
AST: 12 U/L — ABNORMAL LOW (ref 15–41)
Albumin: 3.9 g/dL (ref 3.5–5.0)
Alkaline Phosphatase: 89 U/L (ref 38–126)
Anion gap: 10 (ref 5–15)
BUN: 14 mg/dL (ref 6–20)
CO2: 24 mmol/L (ref 22–32)
Calcium: 9 mg/dL (ref 8.9–10.3)
Chloride: 106 mmol/L (ref 98–111)
Creatinine, Ser: 0.91 mg/dL (ref 0.44–1.00)
GFR, Estimated: >60 mL/min (ref >60)
Glucose, Bld: 91 mg/dL (ref 70–99)
Potassium: 3.5 mmol/L (ref 3.5–5.1)
Sodium: 140 mmol/L (ref 135–145)
Total Bilirubin: 0.4 mg/dL (ref 0.3–1.2)
Total Protein: 8.2 g/dL — ABNORMAL HIGH (ref 6.5–8.1)

## 2019-12-28 LAB — D-DIMER, QUANTITATIVE: D-Dimer, Quant: 0.56 ug/mL-FEU — ABNORMAL HIGH (ref 0.00–0.50)

## 2019-12-28 LAB — TROPONIN I (HIGH SENSITIVITY): Troponin I (High Sensitivity): <2 ng/L (ref <18)

## 2019-12-28 MED ORDER — KETOROLAC TROMETHAMINE 15 MG/ML IJ SOLN
15.0000 mg | Freq: Once | INTRAMUSCULAR | Status: AC
  Administered 2019-12-28: 15 mg via INTRAVENOUS
  Filled 2019-12-28: qty 1

## 2019-12-28 MED ORDER — DEXAMETHASONE SODIUM PHOSPHATE 10 MG/ML IJ SOLN
10.0000 mg | Freq: Once | INTRAMUSCULAR | Status: AC
  Administered 2019-12-28: 10 mg via INTRAVENOUS
  Filled 2019-12-28: qty 1

## 2019-12-28 MED ORDER — PREDNISONE 10 MG (21) PO TBPK: ORAL_TABLET | Freq: Every day | ORAL | 0 refills | Status: AC

## 2019-12-28 MED ORDER — IOHEXOL 350 MG/ML SOLN
100.0000 mL | Freq: Once | INTRAVENOUS | Status: AC | PRN
  Administered 2019-12-28: 100 mL via INTRAVENOUS

## 2019-12-28 NOTE — ED Triage Notes (Signed)
Left shoulder pain described as burning pain x 5 days. Limited motion. Hx of neuropathy. She admits to also having left sided chest pain x5 days as well. She called her pain management doctor this am but they were unable to see her. EKG at triage. She is emotional at triage.

## 2019-12-28 NOTE — Discharge Instructions (Addendum)
Continue taking home medications as prescribed. Take prednisone taper as prescribed.  This should help with inflammation which should improve your breathing and your numbness. Use your inhaler every 4 hours for the next 2 days.  After this, use only as needed for shortness of breath, chest tightness, or wheezing. Follow-up with your primary care doctor for recheck of your symptoms. Return to the emergency room with any new, sudden, concerning symptoms.

## 2019-12-28 NOTE — ED Provider Notes (Signed)
[facility] EMERGENCY DEPARTMENT Provider Note   CSN: [id number] Arrival date & time: 12/28/19  1134     History Chief Complaint  Patient presents with  . Shoulder Pain    Melinda Woods is a 54 y.o. female presenting for evaluation of chest pain, forearm burning, shortness of breath, and calf pain.  Patient states her symptoms began last Wednesday, approximately 5 days ago.  She reports chronic pain all over, and cannot quite describe if her pain is different today.  She reports sharp pain in both calves and a numbness/burning sensation of her left forearm.  This does appear to be worse than her baseline.  Additionally, she reports chest tightness.  This improves when she uses her inhaler, and feels like her asthma.  Reports a history of hypertension, CHF, neuropathy, fibromyalgia.  Takes chronic medications for this, no recent change.  She denies recent fevers, chills, sore throat, cough, nausea, vomiting abdominal pain, urinary symptoms, normal bowel movements.  She denies recent travel, surgeries, immobilization, history of cancer, history of previous DVT/PE, hormone use.    HPI     Past Medical History:  Diagnosis Date  . Anxiety   . Asthma   . CHF (congestive heart failure) (HCC)    grade 1 dd on echo 03/2016  . Fatty liver   . Fibromyalgia   . Hypertension   . Neuropathy   . Ovarian cyst   . RA (rheumatoid arthritis) [facility])     Patient Active Problem List   Diagnosis Date Noted  . Acute CHF (congestive heart failure) (HCC) 11/25/2018  . Musculoskeletal chest pain 08/26/2017  . Asthma exacerbation 04/13/2017  . Asthma exacerbation attacks 04/13/2017  . Hypokalemia 04/13/2017  . Acute bronchitis 01/29/2017  . Depression 01/29/2017  . SIRS (systemic inflammatory response syndrome) (HCC) 01/29/2017  . RA (rheumatoid arthritis) (HCC)   . Acute respiratory failure (HCC) 04/26/2016  . Fatty liver 04/25/2016  . Hypertension 04/23/2016  . Chronic diastolic  CHF (congestive heart failure) (HCC) 04/23/2016  . Morbid obesity (HCC) 04/23/2016  . Peripheral neuropathy 04/23/2016  . Right sided weakness 03/13/2014  . TIA (transient ischemic attack) 03/13/2014    Past Surgical History:  Procedure Laterality Date  . TUBAL LIGATION       OB History   No obstetric history on file.     Family History  Problem Relation Age of Onset  . Stroke Mother   . Diabetes Mellitus II Mother   . Lung cancer Father     Social History   Tobacco Use  . Smoking status: Never Smoker  . Smokeless tobacco: Never Used  Vaping Use  . Vaping Use: Never used  Substance Use Topics  . Alcohol use: No  . Drug use: No    Home Medications Prior to Admission medications   Medication Sig Start Date End Date Taking? Authorizing Provider  albuterol (ACCUNEB) 0.63 MG/3ML nebulizer solution Inhale into the lungs. 03/24/19  Yes [provider]  aspirin EC 81 MG tablet Take 1 tablet (81 mg total) by mouth daily. 08/26/17  Yes Delano Metz, MD  atorvastatin (LIPITOR) 40 MG tablet Take 1 tablet by mouth daily. 03/02/19  Yes [provider]  baclofen (LIORESAL) 10 MG tablet Take 1 tablet by mouth at bedtime as needed. 10/26/19  Yes [provider]  budesonide-formoterol (SYMBICORT) 160-4.5 MCG/ACT inhaler Inhale into the lungs. 11/30/19  Yes [provider]  celecoxib (CELEBREX) 100 MG capsule Take 1 capsule by mouth 2 (two) times daily. 12/10/19  Yes [provider]  DULoxetine (CYMBALTA) 60 MG capsule Take 60 mg by mouth daily. 08/16/17  Yes [provider]  furosemide (LASIX) 40 MG tablet Take 1 tablet by mouth daily. 11/03/19  Yes [provider]  gabapentin (NEURONTIN) 400 MG capsule TAKE 3 CAPSULES (1200 MG TOTAL) BY MOUTH 3 TIMES DAILY. 10/26/19  Yes [provider]  loratadine (CLARITIN) 10 MG tablet Take 10 mg by mouth daily. 09/01/18  Yes [provider]  losartan (COZAAR) 25 MG tablet Take  1 tablet (25 mg total) by mouth daily. 04/26/16  Yes Calvert Cantor, MD  metoprolol tartrate (LOPRESSOR) 25 MG tablet Take 1 tablet by mouth 2 (two) times daily. 10/23/19  Yes [provider]  montelukast (SINGULAIR) 10 MG tablet Take 1 tablet by mouth daily. 12/07/19  Yes [provider]  polyethylene glycol (MIRALAX / GLYCOLAX) packet Take 17 g by mouth daily as needed for mild constipation. 04/26/16  Yes Calvert Cantor, MD  pregabalin (LYRICA) 200 MG capsule Take 200 mg by mouth 3 (three) times daily. 11/11/18  Yes [provider]  pregabalin (LYRICA) 50 MG capsule Take by mouth. 10/26/19  Yes [provider]  rOPINIRole (REQUIP) 0.5 MG tablet Take 0.5 mg by mouth at bedtime. 11/11/18  Yes [provider]  topiramate (TOPAMAX) 50 MG tablet Take by mouth. 10/26/19 01/24/20 Yes [provider]  acetaminophen (TYLENOL) 650 MG CR tablet Take 1,300 mg by mouth daily as needed for pain.    [provider]  albuterol (VENTOLIN HFA) 108 (90 Base) MCG/ACT inhaler Inhale 2 puffs into the lungs every 4 (four) hours as needed for wheezing or shortness of breath. 10/19/18   Derwood Kaplan, MD  amitriptyline (ELAVIL) 100 MG tablet Take 1 tablet (100 mg total) by mouth 2 (two) times daily. 04/26/16   Calvert Cantor, MD  furosemide (LASIX) 40 MG tablet Take 1 tablet (40 mg total) by mouth daily. 08/27/17   Delano Metz, MD  HYDROcodone-acetaminophen (NORCO/VICODIN) 5-325 MG tablet Take 1 tablet by mouth every 6 (six) hours as needed for moderate pain.    [provider]  hydrOXYzine (ATARAX/VISTARIL) 25 MG tablet Take 2 tablets (50 mg total) by mouth every 6 (six) hours as needed for anxiety. 02/22/17   Alvira Monday, MD  ipratropium-albuterol (DUONEB) 0.5-2.5 (3) MG/3ML SOLN Take 3 mLs by nebulization 2 (two) times daily. Patient taking differently: Take 3 mLs by nebulization 2 (two) times daily as needed (shortness of breath/wheezing).  02/02/17    Kathlen Mody, MD  metoprolol tartrate (LOPRESSOR) 25 MG tablet Take 1 tablet (25 mg total) by mouth 2 (two) times daily. 04/26/16   Calvert Cantor, MD  montelukast (SINGULAIR) 10 MG tablet Take 1 tablet (10 mg total) by mouth daily. 08/26/17 11/26/18  Delano Metz, MD  potassium chloride SA (K-DUR,KLOR-CON) 20 MEQ tablet Take 1 tablet (20 mEq total) by mouth daily as needed (always take with the Furosemide). 04/26/16   Calvert Cantor, MD  predniSONE (STERAPRED UNI-PAK 21 TAB) 10 MG (21) TBPK tablet Take by mouth daily. Take 6 tabs by mouth daily  for 2 days, then 5 tabs for 2 days, then 4 tabs for 2 days, then 3 tabs for 2 days, 2 tabs for 2 days, then 1 tab by mouth daily for 2 days 12/28/19   Treyshaun Keatts, PA-C    Allergies    Latex  Review of Systems   Review of Systems  Respiratory: Positive for chest tightness and shortness of breath.  Musculoskeletal: Positive for myalgias.  Neurological:       Burning sensation of L forearm  All other systems reviewed and are negative.   Physical Exam Updated Vital Signs BP 116/73 (BP Location: Right Arm)   Pulse 65   Temp 98.9 F (37.2 C) (Oral)   Resp 20   Ht 5\' 6"  (1.676 m)   Wt 114.3 kg   SpO2 99%   BMI 40.67 kg/m   Physical Exam Vitals and nursing note reviewed.  Constitutional:      General: She is not in acute distress.    Appearance: She is well-developed. She is obese.     Comments: Resting in the bed in NAD  HENT:     Head: Normocephalic and atraumatic.  Eyes:     Extraocular Movements: Extraocular movements intact.     Conjunctiva/sclera: Conjunctivae normal.     Pupils: Pupils are equal, round, and reactive to light.  Cardiovascular:     Rate and Rhythm: Normal rate and regular rhythm.     Pulses: Normal pulses.  Pulmonary:     Effort: Pulmonary effort is normal. No respiratory distress.     Breath sounds: Normal breath sounds. No wheezing.     Comments: Diffuse ttp of the chest wall.  Speaking in full sentences.   Chest:     Chest wall: Tenderness present.  Abdominal:     General: There is no distension.     Palpations: Abdomen is soft. There is no mass.     Tenderness: There is no abdominal tenderness. There is no guarding or rebound.  Musculoskeletal:        General: Tenderness present. Normal range of motion.     Cervical back: Normal range of motion and neck supple.     Right lower leg: No edema.     Left lower leg: No edema.     Comments: Diffuse ttp of the entire body. No clear focal pain.  No leg pain or swelling  Skin:    General: Skin is warm and dry.     Capillary Refill: Capillary refill takes less than 2 seconds.  Neurological:     Mental Status: She is alert and oriented to person, place, and time.     Comments: Pt reports decreased sensation of the L dorsal forearm, but no true numbness. strength of upper extremities equal bilaterally.      ED Results / Procedures / Treatments   Labs (all labs ordered are listed, but only abnormal results are displayed) Labs Reviewed  COMPREHENSIVE METABOLIC PANEL - Abnormal; Notable for the following components:      Result Value   Total Protein 8.2 (*)    AST 12 (*)    All other components within normal limits  D-DIMER, QUANTITATIVE (NOT AT [facility]) - Abnormal; Notable for the following components:   D-Dimer, Quant 0.56 (*)    All other components within normal limits  CBC WITH DIFFERENTIAL/PLATELET  TROPONIN I (HIGH SENSITIVITY)    EKG EKG Interpretation  Date/Time:  Monday December 28 2019 11:41:10 EDT Ventricular Rate:  76 PR Interval:  138 QRS Duration: 84 QT Interval:  398 QTC Calculation: 447 R Axis:   64 Text Interpretation: Normal sinus rhythm Normal ECG No significant change since last tracing Confirmed by [id number] [phone]) on 12/28/2019 12:03:50 PM   Radiology DG Chest 2 View  Result Date: 12/28/2019 CLINICAL DATA:  Chest pain EXAM: CHEST - 2 VIEW COMPARISON:  11/27/2018. FINDINGS: The heart size and  mediastinal  contours are within normal limits. Both lungs are clear. No visible pleural effusions or pneumothorax. No acute osseous abnormality. IMPRESSION: No acute cardiopulmonary disease. Electronically Signed   By: Feliberto Harts MD   On: 12/28/2019 12:57   CT Angio Chest PE W and/or Wo Contrast  Result Date: 12/28/2019 CLINICAL DATA:  PE suspected, low/intermediate prob, positive D-dimer Cough and chest pain. EXAM: CT ANGIOGRAPHY CHEST WITH CONTRAST TECHNIQUE: Multidetector CT imaging of the chest was performed using the standard protocol during bolus administration of intravenous contrast. Multiplanar CT image reconstructions and MIPs were obtained to evaluate the vascular anatomy. CONTRAST:  OMNIPAQUE IOHEXOL 350 MG/ML SOLN COMPARISON:  Radiograph earlier today.  Chest CT 04/23/2016 FINDINGS: Cardiovascular: There are no filling defects within the pulmonary arteries to suggest pulmonary embolus. The thoracic aorta is normal in caliber. Trace aortic atherosclerosis. No aortic dissection. Upper normal heart size. No pericardial effusion. Mediastinum/Nodes: No mediastinal or hilar adenopathy. No esophageal wall thickening. No thyroid nodule. Lungs/Pleura: Low lung volumes with mild breathing motion artifact limiting detailed parenchymal assessment. Heterogeneous pulmonary parenchyma. There is no confluent consolidation. Mild eventration of the right hemidiaphragm. No septal thickening. There is no pleural effusion. No pulmonary mass. Trachea and central bronchi are patent. Upper Abdomen: Enlarged liver partially included. No acute upper abdominal findings. Musculoskeletal: Mild thoracic spondylosis with endplate spurring. There are no acute or suspicious osseous abnormalities. Review of the MIP images confirms the above findings. IMPRESSION: 1. No pulmonary embolus. 2. Low lung volumes with heterogeneous pulmonary parenchyma, may be related to low lung volumes or can be seen with small airways  disease. Aortic Atherosclerosis (ICD10-I70.0). Electronically Signed   By: Narda Rutherford M.D.   On: 12/28/2019 17:09   US Venous Img Lower Bilateral (DVT)  Result Date: 12/28/2019 CLINICAL DATA:  Elevated D-dimer. Lower extremity pain. Evaluate for DVT. EXAM: BILATERAL LOWER EXTREMITY VENOUS DOPPLER ULTRASOUND TECHNIQUE: Gray-scale sonography with graded compression, as well as color Doppler and duplex ultrasound were performed to evaluate the lower extremity deep venous systems from the level of the common femoral vein and including the common femoral, femoral, profunda femoral, popliteal and calf veins including the posterior tibial, peroneal and gastrocnemius veins when visible. The superficial great saphenous vein was also interrogated. Spectral Doppler was utilized to evaluate flow at rest and with distal augmentation maneuvers in the common femoral, femoral and popliteal veins. COMPARISON:  None. FINDINGS: RIGHT LOWER EXTREMITY Common Femoral Vein: No evidence of thrombus. Normal compressibility, respiratory phasicity and response to augmentation. Saphenofemoral Junction: No evidence of thrombus. Normal compressibility and flow on color Doppler imaging. Profunda Femoral Vein: No evidence of thrombus. Normal compressibility and flow on color Doppler imaging. Femoral Vein: No evidence of thrombus. Normal compressibility, respiratory phasicity and response to augmentation. Popliteal Vein: No evidence of thrombus. Normal compressibility, respiratory phasicity and response to augmentation. Calf Veins: Appear patent where visualized. Superficial Great Saphenous Vein: No evidence of thrombus. Normal compressibility. Venous Reflux:  None. Other Findings:  None. LEFT LOWER EXTREMITY Common Femoral Vein: No evidence of thrombus. Normal compressibility, respiratory phasicity and response to augmentation. Saphenofemoral Junction: No evidence of thrombus. Normal compressibility and flow on color Doppler imaging.  Profunda Femoral Vein: No evidence of thrombus. Normal compressibility and flow on color Doppler imaging. Femoral Vein: No evidence of thrombus. Normal compressibility, respiratory phasicity and response to augmentation. Popliteal Vein: No evidence of thrombus. Normal compressibility, respiratory phasicity and response to augmentation. Calf Veins: Appear patent where visualized. Superficial Great Saphenous Vein: No evidence of thrombus.  Normal compressibility. Venous Reflux:  None. Other Findings:  None. IMPRESSION: No evidence of DVT within either lower extremity. Electronically Signed   By: Simonne Come M.D.   On: 12/28/2019 17:57    Procedures Procedures (including critical care time)  Medications Ordered in ED Medications  ketorolac (TORADOL) 15 MG/ML injection 15 mg (15 mg Intravenous Given 12/28/19 1559)  iohexol (OMNIPAQUE) 350 MG/ML injection 100 mL (100 mLs Intravenous Contrast Given 12/28/19 1653)  dexamethasone (DECADRON) injection 10 mg (10 mg Intravenous Given 12/28/19 1814)    ED Course  I have reviewed the triage vital signs and the nursing notes.  Pertinent labs & imaging results that were available during my care of the patient were reviewed by me and considered in my medical decision making (see chart for details).    MDM Rules/Calculators/A&P                         Patient presenting for evaluation of chest tightness, burning sensation of the forearm, and pain.  On exam, patient appears nontoxic.  On further history, pain appears to be chronic, patient states it is not significantly different from her normal.  Chest tightness is improving when she uses her albuterol, likely asthma related.  Patient also with CHF, such we will obtain labs and chest x-ray.  Consider PE, as patient reports calf pain, though she has no PE risk factors.  Will obtain a dimer.  Also consider ACS as the patient has multiple comorbidities, will obtain labs including troponin and EKG.  EKG normal sinus  rhythm and unchanged from previous.  Chest x-ray viewed interpreted by me, no pneumonia, pneumothorax, effusion.  Basic labs overall reassuring, initial troponin negative.  Unfortunately patient's dimer is minimally elevated at 0.56.  As such, will obtain CTA and ultrasound to rule out blood clots.  CTA negative for PE, ultrasound negative for blood clot.  Discussed findings with patient.  Discussed likely inflammation process, consider pleurisy versus asthma versus chronic pain flare.  Will treat with steroid taper.  Patient to use her albuterol to help with shortness of breath, and follow-up closely with primary care.  At this time, pt appears safe for discharge.  Return precautions given.  Patient states she understands and agrees to plan.  Final Clinical Impression(s) / ED Diagnoses Final diagnoses:  Chest pain, unspecified type  Paresthesias    Rx / DC Orders ED Discharge Orders         Ordered    predniSONE (STERAPRED UNI-PAK 21 TAB) 10 MG (21) TBPK tablet  Daily        12/28/19 1849           Alveria Apley, PA-C 12/28/19 2046    Tilden Fossa, MD 12/28/19 2306

## 2019-12-28 NOTE — ED Notes (Signed)
Went to obtain vitals and PT was at Scan

## 2019-12-28 NOTE — ED Notes (Signed)
Pt coughed and states that she has cp when she coughs

## 2020-03-04 ENCOUNTER — Emergency Department (HOSPITAL_BASED_OUTPATIENT_CLINIC_OR_DEPARTMENT_OTHER)
Admission: EM | Admit: 2020-03-04 | Discharge: 2020-03-04 | Disposition: A | Discharge status: Home / Self Care | Payer: 59 | Attending: Emergency Medicine | Admitting: Emergency Medicine

## 2020-03-04 ENCOUNTER — Other Ambulatory Visit: Payer: Self-pay

## 2020-03-04 ENCOUNTER — Emergency Department (HOSPITAL_BASED_OUTPATIENT_CLINIC_OR_DEPARTMENT_OTHER): Payer: 59

## 2020-03-04 ENCOUNTER — Encounter (HOSPITAL_BASED_OUTPATIENT_CLINIC_OR_DEPARTMENT_OTHER): Payer: Self-pay | Admitting: Emergency Medicine

## 2020-03-04 DIAGNOSIS — I5032 Chronic diastolic (congestive) heart failure: Secondary | ICD-10-CM | POA: Diagnosis not present

## 2020-03-04 DIAGNOSIS — Z79899 Other long term (current) drug therapy: Secondary | ICD-10-CM | POA: Insufficient documentation

## 2020-03-04 DIAGNOSIS — J45901 Unspecified asthma with (acute) exacerbation: Secondary | ICD-10-CM | POA: Diagnosis not present

## 2020-03-04 DIAGNOSIS — Z7982 Long term (current) use of aspirin: Secondary | ICD-10-CM | POA: Diagnosis not present

## 2020-03-04 DIAGNOSIS — M7989 Other specified soft tissue disorders: Secondary | ICD-10-CM | POA: Insufficient documentation

## 2020-03-04 DIAGNOSIS — M79609 Pain in unspecified limb: Secondary | ICD-10-CM

## 2020-03-04 DIAGNOSIS — R202 Paresthesia of skin: Secondary | ICD-10-CM | POA: Diagnosis not present

## 2020-03-04 DIAGNOSIS — Z91040 Latex allergy status: Secondary | ICD-10-CM | POA: Insufficient documentation

## 2020-03-04 DIAGNOSIS — M79642 Pain in left hand: Secondary | ICD-10-CM | POA: Insufficient documentation

## 2020-03-04 DIAGNOSIS — Z8673 Personal history of transient ischemic attack (TIA), and cerebral infarction without residual deficits: Secondary | ICD-10-CM | POA: Diagnosis not present

## 2020-03-04 DIAGNOSIS — I110 Hypertensive heart disease with heart failure: Secondary | ICD-10-CM | POA: Diagnosis not present

## 2020-03-04 MED ORDER — OXYCODONE-ACETAMINOPHEN 5-325 MG PO TABS
1.0000 | ORAL_TABLET | Freq: Once | ORAL | Status: AC
  Administered 2020-03-04: 1 via ORAL
  Filled 2020-03-04: qty 1

## 2020-03-04 NOTE — ED Triage Notes (Signed)
Reports she was typing at work yesterday when she had a sharp pain from neck down to left hand.  Now having pain and swelling to the outer three fingers.  Reports hx of the same.  Also has hx of neuropathy.

## 2020-03-04 NOTE — ED Provider Notes (Signed)
[place] EMERGENCY DEPARTMENT Provider Note   CSN: [id number] Arrival date & time: 03/04/20  1109     History Chief Complaint  Patient presents with  . Hand Pain    Melinda Woods is a 55 y.o. female.  The history is provided by the patient and medical records.  Hand Pain   Melinda Woods is a 55 y.o. female who presents to the Emergency Department complaining of hand pain and swelling. She presents to the emergency department for acute on chronic hand pain. She has a history of fibromyalgia with chronic pain and chronic neuropathy. She has been experiencing pain and numbness to the left upper extremity for several months. Last night when she was sitting at rest she had an episode of severe pain that radiated from her left neck and down to her digits. The severe pain was transient but she has significant worsening of her chronic pain. She states that since that time she has noticed swelling to the digits throughout the left hand. No new injuries. No fevers, chills, chest pain, shortness of breath. She did have a similar episode several months ago and it was evaluated at that time with no clear source of her symptoms identified. She is scheduled to follow-up with rheumatology as well as neurology for EMG testing. No history of blood clots. She is right-hand dominant. She works from home.    Past Medical History:  Diagnosis Date  . Anxiety   . Asthma   . CHF (congestive heart failure) ([place])    grade 1 dd on echo 03/2016  . Fatty liver   . Fibromyalgia   . Hypertension   . Neuropathy   . Ovarian cyst   . RA (rheumatoid arthritis) [facility])     Patient Active Problem List   Diagnosis Date Noted  . Acute CHF (congestive heart failure) ([place]) 11/25/2018  . Musculoskeletal chest pain 08/26/2017  . Asthma exacerbation 04/13/2017  . Asthma exacerbation attacks 04/13/2017  . Hypokalemia 04/13/2017  . Acute bronchitis 01/29/2017  . Depression 01/29/2017  . SIRS (systemic  inflammatory response syndrome) ([place]) 01/29/2017  . RA (rheumatoid arthritis) ([place])   . Acute respiratory failure ([place]) 04/26/2016  . Fatty liver 04/25/2016  . Hypertension 04/23/2016  . Chronic diastolic CHF (congestive heart failure) ([place]) 04/23/2016  . Morbid obesity ([place]) 04/23/2016  . Peripheral neuropathy 04/23/2016  . Right sided weakness 03/13/2014  . TIA (transient ischemic attack) 03/13/2014    Past Surgical History:  Procedure Laterality Date  . TUBAL LIGATION       OB History   No obstetric history on file.     Family History  Problem Relation Age of Onset  . Stroke Mother   . Diabetes Mellitus II Mother   . Lung cancer Father     Social History   Tobacco Use  . Smoking status: Never Smoker  . Smokeless tobacco: Never Used  Vaping Use  . Vaping Use: Never used  Substance Use Topics  . Alcohol use: No  . Drug use: No    Home Medications Prior to Admission medications   Medication Sig Start Date End Date Taking? Authorizing Provider  acetaminophen (TYLENOL) 650 MG CR tablet Take 1,300 mg by mouth daily as needed for pain.    [provider]  albuterol (ACCUNEB) 0.63 MG/3ML nebulizer solution Inhale into the lungs. 03/24/19   [provider]  albuterol (VENTOLIN HFA) 108 (90 Base) MCG/ACT inhaler Inhale 2 puffs into the lungs every 4 (four) hours as needed  for wheezing or shortness of breath. 10/19/18   Derwood Kaplan, MD  amitriptyline (ELAVIL) 100 MG tablet Take 1 tablet (100 mg total) by mouth 2 (two) times daily. 04/26/16   Calvert Cantor, MD  aspirin EC 81 MG tablet Take 1 tablet (81 mg total) by mouth daily. 08/26/17   Delano Metz, MD  atorvastatin (LIPITOR) 40 MG tablet Take 1 tablet by mouth daily. 03/02/19   [provider]  baclofen (LIORESAL) 10 MG tablet Take 1 tablet by mouth at bedtime as needed. 10/26/19   [provider]  budesonide-formoterol (SYMBICORT) 160-4.5 MCG/ACT inhaler Inhale into the lungs. 11/30/19    [provider]  celecoxib (CELEBREX) 100 MG capsule Take 1 capsule by mouth 2 (two) times daily. 12/10/19   [provider]  DULoxetine (CYMBALTA) 60 MG capsule Take 60 mg by mouth daily. 08/16/17   [provider]  furosemide (LASIX) 40 MG tablet Take 1 tablet (40 mg total) by mouth daily. 08/27/17   Delano Metz, MD  furosemide (LASIX) 40 MG tablet Take 1 tablet by mouth daily. 11/03/19   [provider]  gabapentin (NEURONTIN) 400 MG capsule TAKE 3 CAPSULES (1200 MG TOTAL) BY MOUTH 3 TIMES DAILY. 10/26/19   [provider]  HYDROcodone-acetaminophen (NORCO/VICODIN) 5-325 MG tablet Take 1 tablet by mouth every 6 (six) hours as needed for moderate pain.    [provider]  hydrOXYzine (ATARAX/VISTARIL) 25 MG tablet Take 2 tablets (50 mg total) by mouth every 6 (six) hours as needed for anxiety. 02/22/17   Alvira Monday, MD  ipratropium-albuterol (DUONEB) 0.5-2.5 (3) MG/3ML SOLN Take 3 mLs by nebulization 2 (two) times daily. Patient taking differently: Take 3 mLs by nebulization 2 (two) times daily as needed (shortness of breath/wheezing).  02/02/17   Kathlen Mody, MD  loratadine (CLARITIN) 10 MG tablet Take 10 mg by mouth daily. 09/01/18   [provider]  losartan (COZAAR) 25 MG tablet Take 1 tablet (25 mg total) by mouth daily. 04/26/16   Calvert Cantor, MD  metoprolol tartrate (LOPRESSOR) 25 MG tablet Take 1 tablet (25 mg total) by mouth 2 (two) times daily. 04/26/16   Calvert Cantor, MD  metoprolol tartrate (LOPRESSOR) 25 MG tablet Take 1 tablet by mouth 2 (two) times daily. 10/23/19   [provider]  montelukast (SINGULAIR) 10 MG tablet Take 1 tablet (10 mg total) by mouth daily. 08/26/17 11/26/18  Delano Metz, MD  montelukast (SINGULAIR) 10 MG tablet Take 1 tablet by mouth daily. 12/07/19   [provider]  polyethylene glycol (MIRALAX / GLYCOLAX) packet Take 17 g by mouth daily as needed for mild constipation. 04/26/16    Calvert Cantor, MD  potassium chloride SA (K-DUR,KLOR-CON) 20 MEQ tablet Take 1 tablet (20 mEq total) by mouth daily as needed (always take with the Furosemide). 04/26/16   Calvert Cantor, MD  predniSONE (STERAPRED UNI-PAK 21 TAB) 10 MG (21) TBPK tablet Take by mouth daily. Take 6 tabs by mouth daily  for 2 days, then 5 tabs for 2 days, then 4 tabs for 2 days, then 3 tabs for 2 days, 2 tabs for 2 days, then 1 tab by mouth daily for 2 days 12/28/19   Caccavale, Sophia, PA-C  pregabalin (LYRICA) 200 MG capsule Take 200 mg by mouth 3 (three) times daily. 11/11/18   [provider]  pregabalin (LYRICA) 50 MG capsule Take by mouth. 10/26/19   [provider]  rOPINIRole (REQUIP) 0.5 MG tablet Take 0.5 mg by mouth at bedtime.  11/11/18   [provider]  topiramate (TOPAMAX) 50 MG tablet Take by mouth. 10/26/19 01/24/20  [provider]    Allergies    Latex  Review of Systems   Review of Systems  All other systems reviewed and are negative.   Physical Exam Updated Vital Signs BP 90/63 (BP Location: Right Arm)   Pulse 81   Temp 98.2 F (36.8 C) (Oral)   Resp 18   Ht 5\' 6"  (1.676 m)   Wt 115.2 kg   SpO2 98%   BMI 41.00 kg/m   Physical Exam Vitals and nursing note reviewed.  Constitutional:      Appearance: She is well-developed and well-nourished.  HENT:     Head: Normocephalic and atraumatic.  Cardiovascular:     Rate and Rhythm: Normal rate and regular rhythm.  Pulmonary:     Effort: Pulmonary effort is normal. No respiratory distress.  Musculoskeletal:        General: Tenderness present. No edema.     Comments: 2+ radial pulses bilaterally. There is moderate soft tissue swelling throughout the entire hand and distal left upper extremity. There is tenderness to palpation throughout the entire left upper extremity. There is no erythema. Range of motion is intact at the shoulder, elbow, wrist, digits.  Skin:    General: Skin is warm and dry.   Neurological:     Mental Status: She is alert and oriented to person, place, and time.     Comments: 4 to 5 strength in all four extremities. Pain limits strength testing. Sensation light touch is intact and bilateral upper extremities.  Psychiatric:        Mood and Affect: Mood and affect normal.        Behavior: Behavior normal.     ED Results / Procedures / Treatments   Labs (all labs ordered are listed, but only abnormal results are displayed) Labs Reviewed - No data to display  EKG None  Radiology CT Cervical Spine Wo Contrast  Result Date: 03/04/2020 CLINICAL DATA:  Pain going down the left arm from the neck. Pain in both hands. No injury. EXAM: CT CERVICAL SPINE WITHOUT CONTRAST TECHNIQUE: Multidetector CT imaging of the cervical spine was performed without intravenous contrast. Multiplanar CT image reconstructions were also generated. COMPARISON:  None. FINDINGS: Alignment: Mild kyphosis, apex at C5.  No spondylolisthesis. Skull base and vertebrae: No acute fracture. No primary bone lesion or focal pathologic process. Soft tissues and spinal canal: No prevertebral fluid or swelling. No visible canal hematoma. Disc levels: Mild loss of disc height at C4-C5 and C5-C6. Mild disc bulging with endplate spurring also noted at these levels, greatest at C5-C6. No convincing disc herniation. Uncovertebral spurring leads to mild neural foraminal narrowing on the right at C4-C5 and on the left at C5-C6. Upper chest: Negative. Other: None. IMPRESSION: 1. No fracture or acute finding.  No spondylolisthesis. 2. Mild kyphosis, apex at C5. 3. Mild disc degenerative changes at C4-C5 and C5-C6. No convincing disc herniation. Electronically Signed   By: 05/02/2020 M.D.   On: 03/04/2020 16:07   05/02/2020 Venous Img Upper Left (DVT Study)  Result Date: 03/04/2020 CLINICAL DATA:  Left hand swelling for 2 months. EXAM: LEFT UPPER EXTREMITY VENOUS DOPPLER ULTRASOUND TECHNIQUE: Gray-scale sonography with graded  compression, as well as color Doppler and duplex ultrasound were performed to evaluate the upper extremity deep venous system from the level of the subclavian vein and including the jugular, axillary, basilic, radial, ulnar and upper  cephalic vein. Spectral Doppler was utilized to evaluate flow at rest and with distal augmentation maneuvers. COMPARISON:  None. FINDINGS: Contralateral Subclavian Vein: Respiratory phasicity is normal and symmetric with the symptomatic side. No evidence of thrombus. Normal compressibility. Internal Jugular Vein: No evidence of thrombus. Normal compressibility, respiratory phasicity and response to augmentation. Subclavian Vein: No evidence of thrombus. Normal compressibility, respiratory phasicity and response to augmentation. Axillary Vein: No evidence of thrombus. Normal compressibility, respiratory phasicity and response to augmentation. Cephalic Vein: No evidence of thrombus. Normal compressibility, respiratory phasicity and response to augmentation. Basilic Vein: No evidence of thrombus. Normal compressibility, respiratory phasicity and response to augmentation. Brachial Veins: No evidence of thrombus. Normal compressibility, respiratory phasicity and response to augmentation. Radial Veins: No evidence of thrombus. Normal compressibility, respiratory phasicity and response to augmentation. Ulnar Veins: No evidence of thrombus. Normal compressibility, respiratory phasicity and response to augmentation. Other Findings:  None visualized. IMPRESSION: No evidence of DVT within the left upper extremity. Electronically Signed   By: Katherine Mantle M.D.   On: 03/04/2020 18:11    Procedures Procedures (including critical care time)  Medications Ordered in ED Medications  oxyCODONE-acetaminophen (PERCOCET/ROXICET) 5-325 MG per tablet 1 tablet (1 tablet Oral Given 03/04/20 1549)    ED Course  I have reviewed the triage vital signs and the nursing notes.  Pertinent labs &  imaging results that were available during my care of the patient were reviewed by me and considered in my medical decision making (see chart for details).    MDM Rules/Calculators/A&P                          Patient with history of neuropathy, chronic pain here for evaluation of acute on chronic paresthesias and pain to the left upper extremity with new onset edema to the left upper extremity. She does have decreased grip strength and bilateral upper extremities but does have significant pain that limits strength testing. There is no evidence of acute infectious process on examination. No evidence of acute cord compression. Presentation is not consistent with epidural abscess, osteomyelitis. Vascular ultrasound is negative for DVT. Discussed with patient unclear source of swelling, pain. Discussed outpatient follow-up and return precautions. Final Clinical Impression(s) / ED Diagnoses Final diagnoses:  Paresthesia and pain of left extremity    Rx / DC Orders ED Discharge Orders    None       Tilden Fossa, MD 03/04/20 1901

## 2021-01-03 IMAGING — CT CT ANGIO CHEST
2 of 8 series · 18 of 36 positions shown · IV contrast (Omnipaque)
Comparison: Radiograph earlier today.  Chest CT 04/23/2016

CLINICAL DATA: PE suspected, low/intermediate prob, positive
D-dimer

Cough and chest pain.
EXAM:
CT ANGIOGRAPHY CHEST WITH CONTRAST
TECHNIQUE: Multidetector CT imaging of the chest was performed using the
standard protocol during bolus administration of intravenous
contrast. Multiplanar CT image reconstructions and MIPs were
obtained to evaluate the vascular anatomy.
CONTRAST:  100mL OMNIPAQUE IOHEXOL 350 MG/ML SOLN

[Series 6: pe coronal mpr · coronal · 0.48mm/px · 1 of 95 slices shown]
[im 48/95  mediastinal]
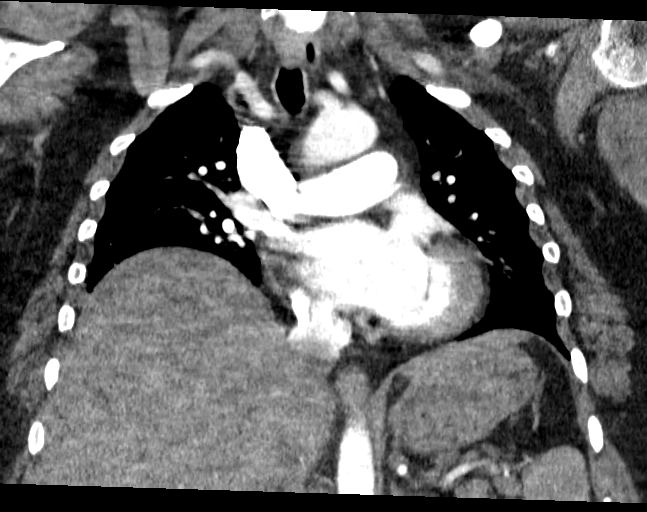

[Series 10: pe thins · axial · 0.64mm/px · z∈[-384,-176]mm · 17 of 310 slices shown]
[im 16/310  lung]
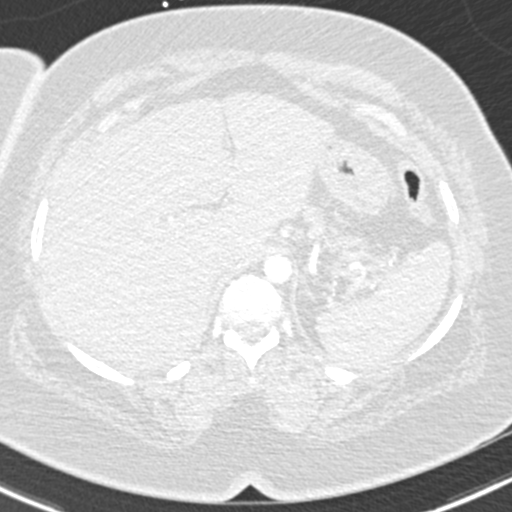
[im 31/310  mediastinal]
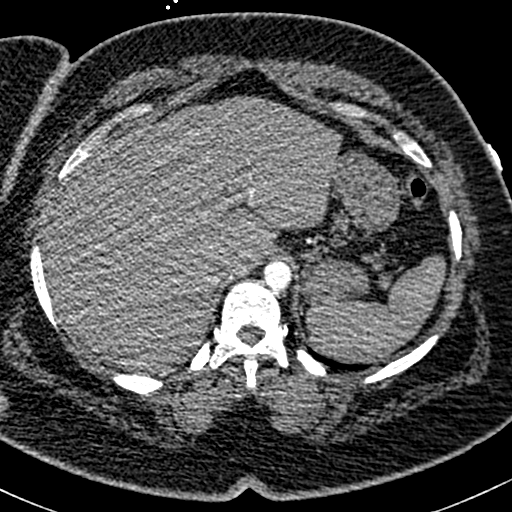
[im 47/310  lung]
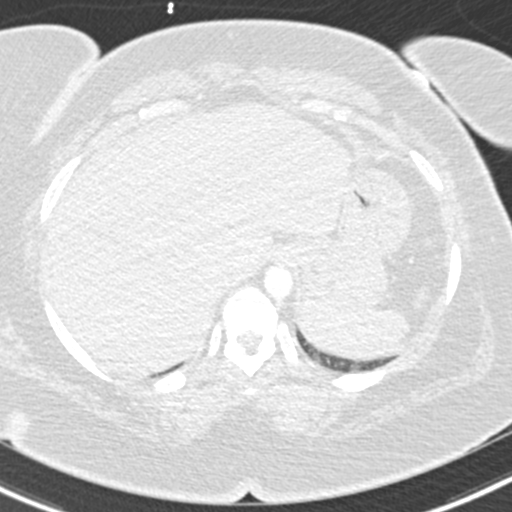
[im 62/310  mediastinal]
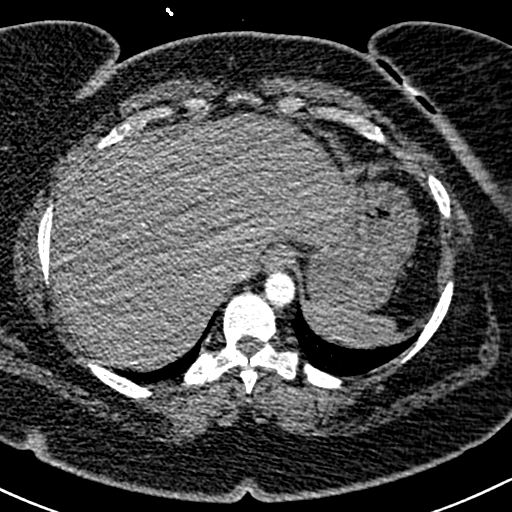
[im 93/310  lung]
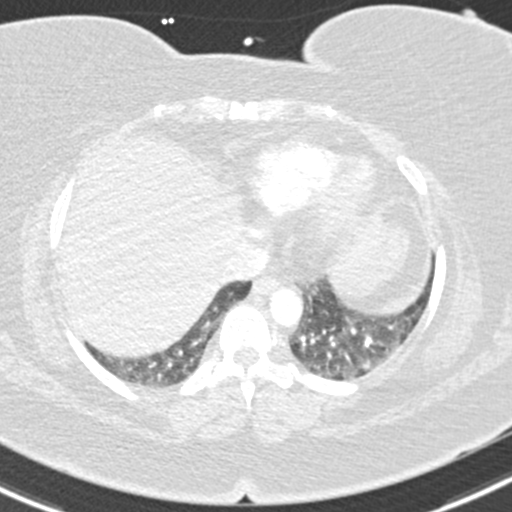
[im 109/310  mediastinal]
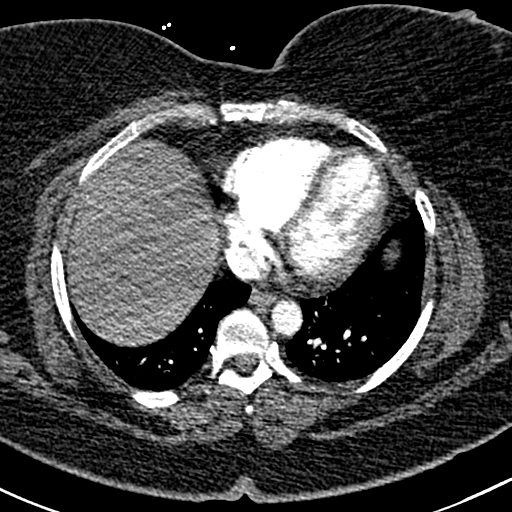
[im 124/310  lung]
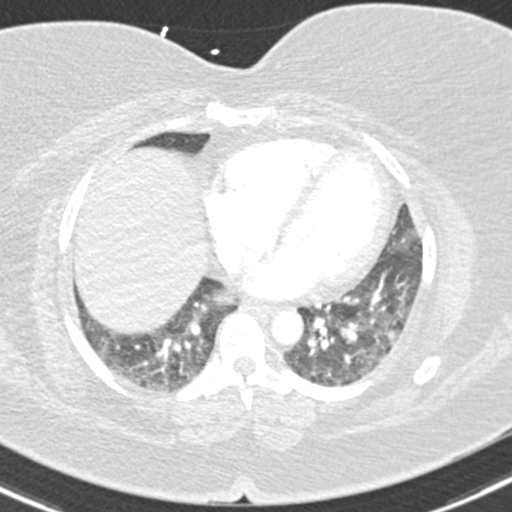
[im 140/310  mediastinal]
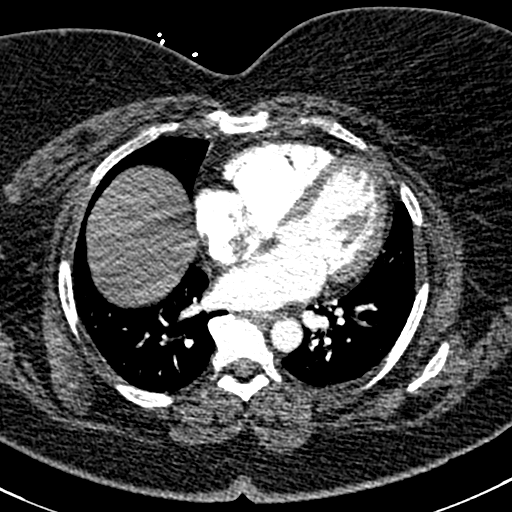
[im 155/310  lung]
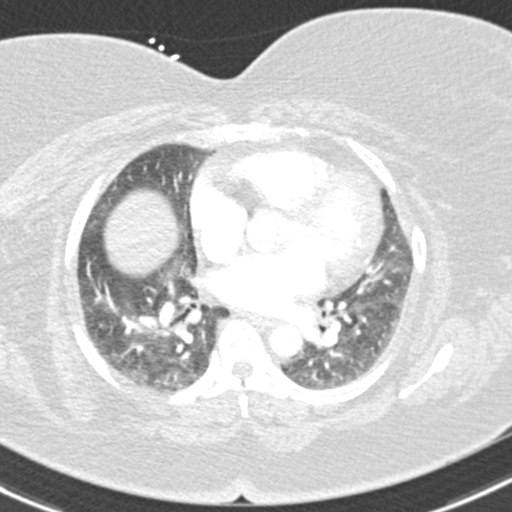
[im 170/310  mediastinal]
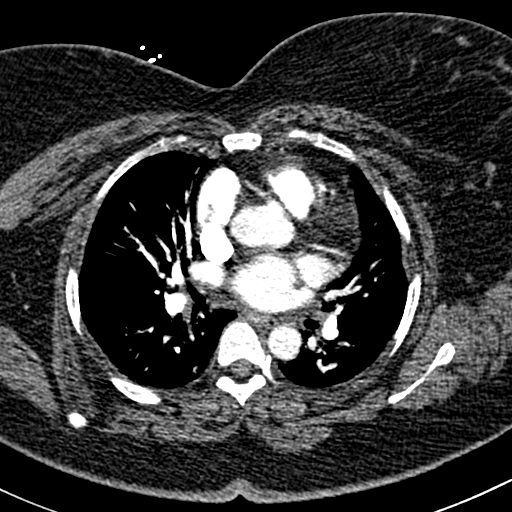
[im 186/310  lung]
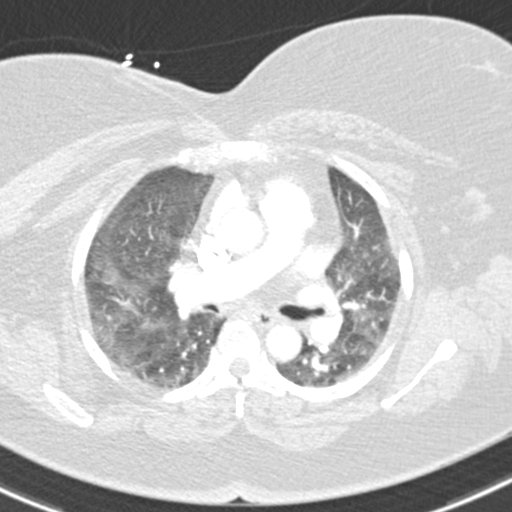
[im 201/310  mediastinal]
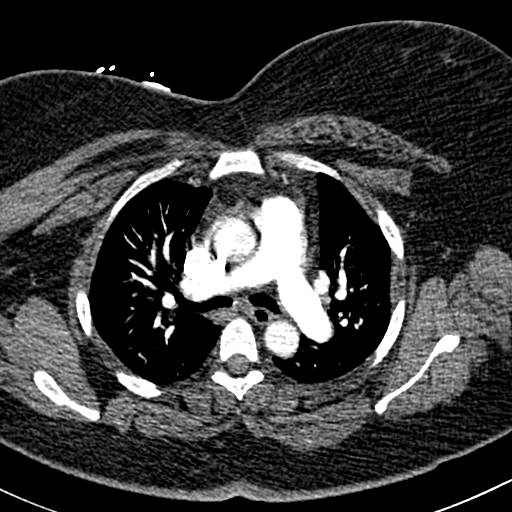
[im 217/310  lung]
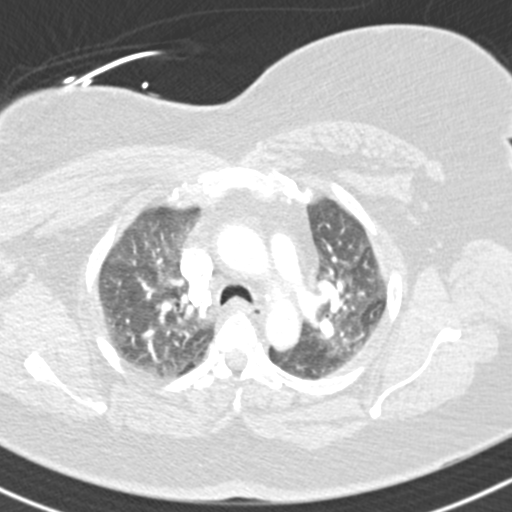
[im 248/310  mediastinal]
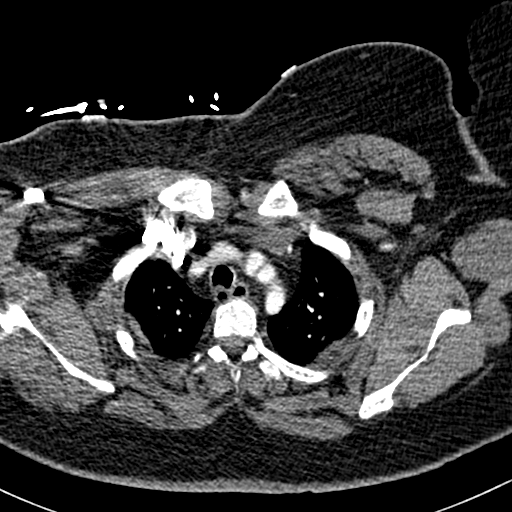
[im 263/310  lung]
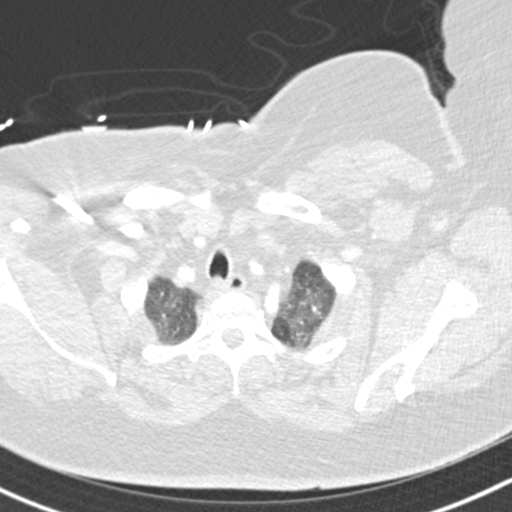
[im 279/310  mediastinal]
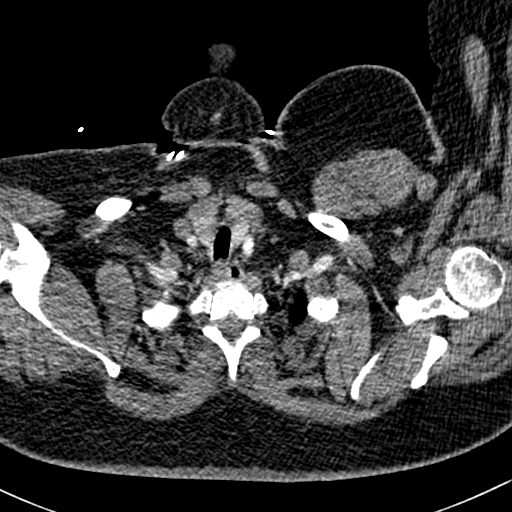
[im 294/310  lung]
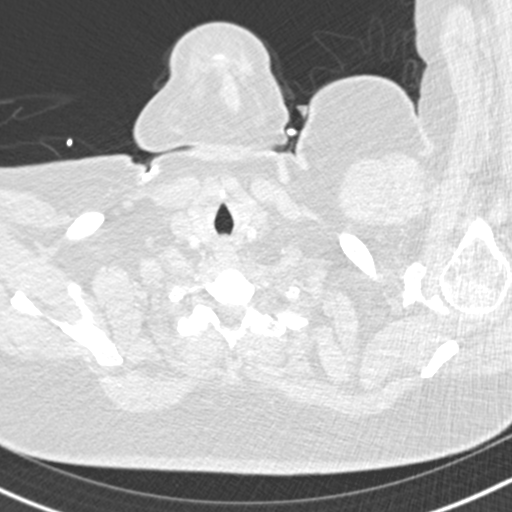

[18 of 36 positions shown; findings below may reference images not displayed]

FINDINGS: Cardiovascular: There are no filling defects within the pulmonary
arteries to suggest pulmonary embolus. The thoracic aorta is normal
in caliber. Trace aortic atherosclerosis. No aortic dissection.
Upper normal heart size. No pericardial effusion.

Mediastinum/Nodes: No mediastinal or hilar adenopathy. No esophageal
wall thickening. No thyroid nodule.

Lungs/Pleura: Low lung volumes with mild breathing motion artifact
limiting detailed parenchymal assessment. Heterogeneous pulmonary
parenchyma. There is no confluent consolidation. Mild eventration of
the right hemidiaphragm. No septal thickening. There is no pleural
effusion. No pulmonary mass. Trachea and central bronchi are patent.

Upper Abdomen: Enlarged liver partially included. No acute upper
abdominal findings.

Musculoskeletal: Mild thoracic spondylosis with endplate spurring.
There are no acute or suspicious osseous abnormalities.

Review of the MIP images confirms the above findings.
IMPRESSION: 1. No pulmonary embolus.
2. Low lung volumes with heterogeneous pulmonary parenchyma, may be
related to low lung volumes or can be seen with small airways
disease.

Aortic Atherosclerosis ([id number]-OM6.6).

## 2021-03-14 ENCOUNTER — Encounter (HOSPITAL_BASED_OUTPATIENT_CLINIC_OR_DEPARTMENT_OTHER): Payer: Self-pay

## 2021-03-14 ENCOUNTER — Other Ambulatory Visit: Payer: Self-pay

## 2021-03-14 ENCOUNTER — Emergency Department (HOSPITAL_BASED_OUTPATIENT_CLINIC_OR_DEPARTMENT_OTHER)
Admission: EM | Admit: 2021-03-14 | Discharge: 2021-03-14 | Disposition: A | Discharge status: Home / Self Care | Payer: 59 | Attending: Emergency Medicine | Admitting: Emergency Medicine

## 2021-03-14 ENCOUNTER — Emergency Department (HOSPITAL_BASED_OUTPATIENT_CLINIC_OR_DEPARTMENT_OTHER): Payer: 59

## 2021-03-14 DIAGNOSIS — Z91040 Latex allergy status: Secondary | ICD-10-CM | POA: Insufficient documentation

## 2021-03-14 DIAGNOSIS — Z79899 Other long term (current) drug therapy: Secondary | ICD-10-CM | POA: Diagnosis not present

## 2021-03-14 DIAGNOSIS — Z7982 Long term (current) use of aspirin: Secondary | ICD-10-CM | POA: Diagnosis not present

## 2021-03-14 DIAGNOSIS — R0789 Other chest pain: Secondary | ICD-10-CM | POA: Diagnosis present

## 2021-03-14 LAB — CBC
HCT: 38 % (ref 36.0–46.0)
Hemoglobin: 12.5 g/dL (ref 12.0–15.0)
MCH: 30 pg (ref 26.0–34.0)
MCHC: 32.9 g/dL (ref 30.0–36.0)
MCV: 91.3 fL (ref 80.0–100.0)
Platelets: 307 10*3/uL (ref 150–400)
RBC: 4.16 MIL/uL (ref 3.87–5.11)
RDW: 13.3 % (ref 11.5–15.5)
WBC: 6.6 10*3/uL (ref 4.0–10.5)
nRBC: 0 % (ref 0.0–0.2)

## 2021-03-14 LAB — BASIC METABOLIC PANEL
Anion gap: 8 (ref 5–15)
BUN: 17 mg/dL (ref 6–20)
CO2: 24 mmol/L (ref 22–32)
Calcium: 9 mg/dL (ref 8.9–10.3)
Chloride: 105 mmol/L (ref 98–111)
Creatinine, Ser: 0.99 mg/dL (ref 0.44–1.00)
GFR, Estimated: >60 mL/min (ref >60)
Glucose, Bld: 99 mg/dL (ref 70–99)
Potassium: 3.3 mmol/L — ABNORMAL LOW (ref 3.5–5.1)
Sodium: 137 mmol/L (ref 135–145)

## 2021-03-14 LAB — TROPONIN I (HIGH SENSITIVITY): Troponin I (High Sensitivity): <2 ng/L (ref <18)

## 2021-03-14 LAB — BRAIN NATRIURETIC PEPTIDE: B Natriuretic Peptide: 18.4 pg/mL (ref 0.0–100.0)

## 2021-03-14 MED ORDER — KETOROLAC TROMETHAMINE 60 MG/2ML IM SOLN
60.0000 mg | Freq: Once | INTRAMUSCULAR | Status: AC
Start: 2021-03-14 — End: 2021-03-14
  Administered 2021-03-14: 60 mg via INTRAMUSCULAR
  Filled 2021-03-14: qty 2

## 2021-03-14 NOTE — ED Notes (Signed)
Pt able to stand/pivot to chair in room, no assistance needed.

## 2021-03-14 NOTE — Discharge Instructions (Signed)
Recommend Tylenol and ibuprofen at home for pain.  Follow-up with your primary care doctor.  Recommend 1000 mg of Tylenol every 6 hours.  Recommend 400 mg ibuprofen every 8 hours.

## 2021-03-14 NOTE — ED Triage Notes (Addendum)
Pt c/o left side CP x 2 weeks-denies fever/flu sx-states she is concerned due to hx CHF-NAD-to triage in w/c-stood for weight w/o difficulty

## 2021-03-14 NOTE — ED Provider Notes (Signed)
[facility] EMERGENCY DEPARTMENT Provider Note   CSN: [id number] Arrival date & time: 03/14/21  1209     History  Chief Complaint  Patient presents with   Chest Pain    Melinda Woods is a 56 y.o. female.  The history is provided by the patient.  Chest Pain Pain location:  L chest Pain quality: aching   Pain radiates to:  Does not radiate Pain severity:  Mild Onset quality:  Gradual Timing:  Intermittent Progression:  Waxing and waning Chronicity:  New Context: lifting, movement and raising an arm   Relieved by:  Nothing Worsened by:  Movement Associated symptoms: no abdominal pain, no anorexia, no anxiety, no back pain, no claudication, no cough, no diaphoresis, no dizziness, no dysphagia, no fatigue, no fever, no headache, no heartburn, no lower extremity edema, no nausea, no near-syncope, no numbness, no orthopnea, no palpitations, no PND, no shortness of breath, no syncope, no vomiting and no weakness   Risk factors: hypertension   Risk factors comment:  CHF     Home Medications Prior to Admission medications   Medication Sig Start Date End Date Taking? Authorizing Provider  acetaminophen (TYLENOL) 650 MG CR tablet Take 1,300 mg by mouth daily as needed for pain.    [provider]  albuterol (ACCUNEB) 0.63 MG/3ML nebulizer solution Inhale into the lungs. 03/24/19   [provider]  albuterol (VENTOLIN HFA) 108 (90 Base) MCG/ACT inhaler Inhale 2 puffs into the lungs every 4 (four) hours as needed for wheezing or shortness of breath. 10/19/18   Derwood Kaplan, MD  amitriptyline (ELAVIL) 100 MG tablet Take 1 tablet (100 mg total) by mouth 2 (two) times daily. 04/26/16   Calvert Cantor, MD  aspirin EC 81 MG tablet Take 1 tablet (81 mg total) by mouth daily. 08/26/17   Delano Metz, MD  atorvastatin (LIPITOR) 40 MG tablet Take 1 tablet by mouth daily. 03/02/19   [provider]  baclofen (LIORESAL) 10 MG tablet Take 1 tablet by mouth at  bedtime as needed. 10/26/19   [provider]  budesonide-formoterol (SYMBICORT) 160-4.5 MCG/ACT inhaler Inhale into the lungs. 11/30/19   [provider]  celecoxib (CELEBREX) 100 MG capsule Take 1 capsule by mouth 2 (two) times daily. 12/10/19   [provider]  DULoxetine (CYMBALTA) 60 MG capsule Take 60 mg by mouth daily. 08/16/17   [provider]  furosemide (LASIX) 40 MG tablet Take 1 tablet (40 mg total) by mouth daily. 08/27/17   Delano Metz, MD  furosemide (LASIX) 40 MG tablet Take 1 tablet by mouth daily. 11/03/19   [provider]  gabapentin (NEURONTIN) 400 MG capsule TAKE 3 CAPSULES (1200 MG TOTAL) BY MOUTH 3 TIMES DAILY. 10/26/19   [provider]  HYDROcodone-acetaminophen (NORCO/VICODIN) 5-325 MG tablet Take 1 tablet by mouth every 6 (six) hours as needed for moderate pain.    [provider]  hydrOXYzine (ATARAX/VISTARIL) 25 MG tablet Take 2 tablets (50 mg total) by mouth every 6 (six) hours as needed for anxiety. 02/22/17   Alvira Monday, MD  ipratropium-albuterol (DUONEB) 0.5-2.5 (3) MG/3ML SOLN Take 3 mLs by nebulization 2 (two) times daily. Patient taking differently: Take 3 mLs by nebulization 2 (two) times daily as needed (shortness of breath/wheezing).  02/02/17   Kathlen Mody, MD  loratadine (CLARITIN) 10 MG tablet Take 10 mg by mouth daily. 09/01/18   [provider]  losartan (COZAAR) 25 MG tablet Take 1 tablet (25 mg total) by mouth daily.  04/26/16   Calvert Cantor, MD  metoprolol tartrate (LOPRESSOR) 25 MG tablet Take 1 tablet (25 mg total) by mouth 2 (two) times daily. 04/26/16   Calvert Cantor, MD  metoprolol tartrate (LOPRESSOR) 25 MG tablet Take 1 tablet by mouth 2 (two) times daily. 10/23/19   [provider]  montelukast (SINGULAIR) 10 MG tablet Take 1 tablet (10 mg total) by mouth daily. 08/26/17 11/26/18  Delano Metz, MD  montelukast (SINGULAIR) 10 MG tablet Take 1 tablet by mouth daily.  12/07/19   [provider]  polyethylene glycol (MIRALAX / GLYCOLAX) packet Take 17 g by mouth daily as needed for mild constipation. 04/26/16   Calvert Cantor, MD  potassium chloride SA (K-DUR,KLOR-CON) 20 MEQ tablet Take 1 tablet (20 mEq total) by mouth daily as needed (always take with the Furosemide). 04/26/16   Calvert Cantor, MD  predniSONE (STERAPRED UNI-PAK 21 TAB) 10 MG (21) TBPK tablet Take by mouth daily. Take 6 tabs by mouth daily  for 2 days, then 5 tabs for 2 days, then 4 tabs for 2 days, then 3 tabs for 2 days, 2 tabs for 2 days, then 1 tab by mouth daily for 2 days 12/28/19   Caccavale, Sophia, PA-C  pregabalin (LYRICA) 200 MG capsule Take 200 mg by mouth 3 (three) times daily. 11/11/18   [provider]  pregabalin (LYRICA) 50 MG capsule Take by mouth. 10/26/19   [provider]  rOPINIRole (REQUIP) 0.5 MG tablet Take 0.5 mg by mouth at bedtime. 11/11/18   [provider]  topiramate (TOPAMAX) 50 MG tablet Take by mouth. 10/26/19 01/24/20  [provider]      Allergies    Latex    Review of Systems   Review of Systems  Constitutional:  Negative for diaphoresis, fatigue and fever.  HENT:  Negative for trouble swallowing.   Respiratory:  Negative for cough and shortness of breath.   Cardiovascular:  Positive for chest pain. Negative for palpitations, orthopnea, claudication, syncope, PND and near-syncope.  Gastrointestinal:  Negative for abdominal pain, anorexia, heartburn, nausea and vomiting.  Musculoskeletal:  Negative for back pain.  Neurological:  Negative for dizziness, weakness, numbness and headaches.   Physical Exam Updated Vital Signs BP 101/74 (BP Location: Left Arm)    Pulse 80    Temp 98.2 F (36.8 C) (Oral)    Resp 18    Ht 5\' 6"  (1.676 m)    Wt 109.8 kg    LMP 03/11/2021    SpO2 98%    BMI 39.06 kg/m  Physical Exam Vitals and nursing note reviewed.  Constitutional:      General: She is not in acute distress.     Appearance: She is well-developed. She is not ill-appearing.  HENT:     Head: Normocephalic and atraumatic.  Eyes:     Extraocular Movements: Extraocular movements intact.     Conjunctiva/sclera: Conjunctivae normal.     Pupils: Pupils are equal, round, and reactive to light.  Cardiovascular:     Rate and Rhythm: Normal rate and regular rhythm.     Pulses:          Radial pulses are 2+ on the right side and 2+ on the left side.     Heart sounds: No murmur heard. Pulmonary:     Effort: Pulmonary effort is normal. No respiratory distress.     Breath sounds: Normal breath sounds.  Chest:     Chest wall: Tenderness present.  Abdominal:  Palpations: Abdomen is soft.     Tenderness: There is no abdominal tenderness.  Musculoskeletal:        General: No swelling. Normal range of motion.     Cervical back: Normal range of motion and neck supple.     Right lower leg: No edema.     Left lower leg: No edema.  Skin:    General: Skin is warm and dry.     Capillary Refill: Capillary refill takes less than 2 seconds.  Neurological:     Mental Status: She is alert.  Psychiatric:        Mood and Affect: Mood normal.    ED Results / Procedures / Treatments   Labs (all labs ordered are listed, but only abnormal results are displayed) Labs Reviewed  BASIC METABOLIC PANEL - Abnormal; Notable for the following components:      Result Value   Potassium 3.3 (*)    All other components within normal limits  CBC  BRAIN NATRIURETIC PEPTIDE  TROPONIN I (HIGH SENSITIVITY)    EKG EKG Interpretation  Date/Time:  Tuesday March 14 2021 12:21:12 EST Ventricular Rate:  83 PR Interval:  148 QRS Duration: 84 QT Interval:  352 QTC Calculation: 413 R Axis:   40 Text Interpretation: Normal sinus rhythm Normal ECG When compared with ECG of 28-Dec-2019 11:41, PREVIOUS ECG IS PRESENT Confirmed by Virgina Norfolkuratolo, Nickholas Goldston ([id number]) on 03/14/2021 12:24:21 PM  Radiology DG Chest 2 View  Result Date:  03/14/2021 CLINICAL DATA:  Pt c/o left side CP x 2 weeks-denies fever/flu sx-states she is concerned due to hx CHF-NAD-to triage in w/c-stood for weight w/o difficulty EXAM: CHEST - 2 VIEW COMPARISON:  04/29/2020 FINDINGS: Lungs are clear. Heart size and mediastinal contours are within normal limits. No effusion. Visualized bones unremarkable. IMPRESSION: No acute cardiopulmonary disease. Electronically Signed   By: Corlis Leak  Hassell M.D.   On: 03/14/2021 14:05    Procedures Procedures    Medications Ordered in ED Medications  ketorolac (TORADOL) injection 60 mg (has no administration in time range)    ED Course/ Medical Decision Making/ A&P                           Medical Decision Making Amount and/or Complexity of Data Reviewed Labs: ordered. Radiology: ordered.  Risk Prescription drug management.   Edgardo RoysLaura Cranford is here with chest wall pain.  History of hypertension, heart failure, rheumatoid arthritis, fibromyalgia.  Normal vitals.  No fever.  Has been having left-sided chest wall pain for the last several days.  Worse with movement.  Worse on palpation.  Reproducible on exam.  Does have some cardiac risk factors will get troponin, BNP, chest x-ray, EKG to further evaluate.  Differential diagnosis is likely musculoskeletal pain but will rule out acute coronary syndrome, pneumonia, rib fracture.  No concern for blood clot.  EKG reviewed and interpreted by myself shows sinus rhythm.  No ischemic changes.  Lab work and imaging reviewed by myself and interpreted that shows no significant anemia, electrolyte Abdel[place], kidney injury.  Troponin normal.  BNP normal.  Doubt acute coronary syndrome.  Doubt ACS.  Chest x-ray shows no evidence of pneumonia or pneumothorax.  Overall suspect chest wall discomfort secondary to musculoskeletal process.  Patient was given Toradol with improvement.  Recommend Tylenol and ibuprofen at home.  Recommend follow-up primary care doctor.  Discharged in good  condition.  This chart was dictated using voice recognition software.  Despite best efforts  to proofread,  errors can occur which can change the documentation meaning.         Final Clinical Impression(s) / ED Diagnoses Final diagnoses:  Chest wall pain    Rx / DC Orders ED Discharge Orders     None         Virgina Norfolk, DO 03/14/21 1416

## 2021-03-14 NOTE — ED Notes (Signed)
D/c paperwork reviewed with pt and pts spouse. No questions or concerns at time of d/c. Pt able to transfer to wheelchair without assistance, pushed by spouse to ED exit.

## 2021-07-12 ENCOUNTER — Emergency Department (HOSPITAL_BASED_OUTPATIENT_CLINIC_OR_DEPARTMENT_OTHER): Payer: 59

## 2021-07-12 ENCOUNTER — Emergency Department (HOSPITAL_BASED_OUTPATIENT_CLINIC_OR_DEPARTMENT_OTHER)
Admission: EM | Admit: 2021-07-12 | Discharge: 2021-07-12 | Disposition: A | Discharge status: Home / Self Care | Payer: 59 | Attending: Emergency Medicine | Admitting: Emergency Medicine

## 2021-07-12 ENCOUNTER — Other Ambulatory Visit: Payer: Self-pay

## 2021-07-12 ENCOUNTER — Encounter (HOSPITAL_BASED_OUTPATIENT_CLINIC_OR_DEPARTMENT_OTHER): Payer: Self-pay

## 2021-07-12 DIAGNOSIS — I509 Heart failure, unspecified: Secondary | ICD-10-CM | POA: Diagnosis not present

## 2021-07-12 DIAGNOSIS — R0602 Shortness of breath: Secondary | ICD-10-CM | POA: Diagnosis not present

## 2021-07-12 DIAGNOSIS — Z7951 Long term (current) use of inhaled steroids: Secondary | ICD-10-CM | POA: Insufficient documentation

## 2021-07-12 DIAGNOSIS — Z20822 Contact with and (suspected) exposure to covid-19: Secondary | ICD-10-CM | POA: Diagnosis not present

## 2021-07-12 DIAGNOSIS — R079 Chest pain, unspecified: Secondary | ICD-10-CM | POA: Insufficient documentation

## 2021-07-12 DIAGNOSIS — J45909 Unspecified asthma, uncomplicated: Secondary | ICD-10-CM | POA: Insufficient documentation

## 2021-07-12 DIAGNOSIS — Z79899 Other long term (current) drug therapy: Secondary | ICD-10-CM | POA: Diagnosis not present

## 2021-07-12 DIAGNOSIS — R42 Dizziness and giddiness: Secondary | ICD-10-CM | POA: Diagnosis not present

## 2021-07-12 DIAGNOSIS — R059 Cough, unspecified: Secondary | ICD-10-CM | POA: Insufficient documentation

## 2021-07-12 DIAGNOSIS — I110 Hypertensive heart disease with heart failure: Secondary | ICD-10-CM | POA: Diagnosis not present

## 2021-07-12 DIAGNOSIS — Z91040 Latex allergy status: Secondary | ICD-10-CM | POA: Diagnosis not present

## 2021-07-12 DIAGNOSIS — Z7982 Long term (current) use of aspirin: Secondary | ICD-10-CM | POA: Insufficient documentation

## 2021-07-12 LAB — BRAIN NATRIURETIC PEPTIDE: B Natriuretic Peptide: 21.7 pg/mL (ref 0.0–100.0)

## 2021-07-12 LAB — TROPONIN I (HIGH SENSITIVITY)
Troponin I (High Sensitivity): <2 ng/L (ref <18)
Troponin I (High Sensitivity): <2 ng/L (ref <18)

## 2021-07-12 LAB — BASIC METABOLIC PANEL
Anion gap: 9 (ref 5–15)
BUN: 12 mg/dL (ref 6–20)
CO2: 22 mmol/L (ref 22–32)
Calcium: 9.3 mg/dL (ref 8.9–10.3)
Chloride: 109 mmol/L (ref 98–111)
Creatinine, Ser: 0.95 mg/dL (ref 0.44–1.00)
GFR, Estimated: >60 mL/min (ref >60)
Glucose, Bld: 107 mg/dL — ABNORMAL HIGH (ref 70–99)
Potassium: 3.2 mmol/L — ABNORMAL LOW (ref 3.5–5.1)
Sodium: 140 mmol/L (ref 135–145)

## 2021-07-12 LAB — CBC
HCT: 39.9 % (ref 36.0–46.0)
Hemoglobin: 12.8 g/dL (ref 12.0–15.0)
MCH: 29.4 pg (ref 26.0–34.0)
MCHC: 32.1 g/dL (ref 30.0–36.0)
MCV: 91.7 fL (ref 80.0–100.0)
Platelets: 241 10*3/uL (ref 150–400)
RBC: 4.35 MIL/uL (ref 3.87–5.11)
RDW: 13.6 % (ref 11.5–15.5)
WBC: 5.7 10*3/uL (ref 4.0–10.5)
nRBC: 0 % (ref 0.0–0.2)

## 2021-07-12 LAB — PREGNANCY, URINE: Preg Test, Ur: NEGATIVE

## 2021-07-12 LAB — RESP PANEL BY RT-PCR (FLU A&B, COVID) ARPGX2
Influenza A by PCR: NEGATIVE
Influenza B by PCR: NEGATIVE
SARS Coronavirus 2 by RT PCR: NEGATIVE

## 2021-07-12 MED ORDER — ALBUTEROL SULFATE (2.5 MG/3ML) 0.083% IN NEBU
2.5000 mg | INHALATION_SOLUTION | Freq: Once | RESPIRATORY_TRACT | Status: AC
  Administered 2021-07-12: 2.5 mg via RESPIRATORY_TRACT
  Filled 2021-07-12: qty 3

## 2021-07-12 MED ORDER — KETOROLAC TROMETHAMINE 60 MG/2ML IM SOLN
60.0000 mg | Freq: Once | INTRAMUSCULAR | Status: AC
Start: 2021-07-12 — End: 2021-07-12
  Administered 2021-07-12: 60 mg via INTRAMUSCULAR
  Filled 2021-07-12: qty 2

## 2021-07-12 NOTE — Discharge Instructions (Addendum)
You were seen in the emergency department for chest pain. ? ?Your lab work and imaging today was reassuring. We have given you medicine for your pain.  ? ?It is incredibly important that you follow up with your primary doctor about your symptoms. ? ?Please return to the ER for any new or worsening symptoms such as worsening pain, pain worse with exertion, sweating or  passing out.  ?

## 2021-07-12 NOTE — ED Triage Notes (Signed)
Pain that started in back this am ?Pain radiating to left side of chest  ?Reports SOB and dizziness  ?H/o CHF ?

## 2021-07-12 NOTE — ED Provider Notes (Signed)
[facility] EMERGENCY DEPART[facility] Provider Note   CSN: [id number] Arrival date & time: 07/12/21  1926     History  Chief Complaint  Patient presents with   Chest Pain    Melinda Woods is a 56 y.o. female who presents emergency department complaining of left-sided chest pain starting this morning.  Patient has a history of CHF, and states that she has had similar symptoms prior, and comes to the ER to "make sure it is not her heart".  She reports associated shortness of breath and dizziness, no syncope.  She feels as though her pain is slightly worse with exertion, and feels sharp.  Complains of chronic productive cough, no change today.  No history of MI.   Chest Pain Associated symptoms: cough, dizziness and shortness of breath   Associated symptoms: no abdominal pain, no diaphoresis, no fever, no nausea and no vomiting    HPI: A 56 year old patient with a history of hypertension and obesity presents for evaluation of chest pain. Initial onset of pain was more than 6 hours ago. The patient's chest pain is sharp and is worse with exertion. The patient's chest pain is middle- or left-sided, is not well-localized, is not described as heaviness/pressure/tightness and does not radiate to the arms/jaw/neck. The patient does not complain of nausea and denies diaphoresis. The patient has smoked in the past 90 days. The patient has no history of stroke, has no history of peripheral artery disease, denies any history of treated diabetes, has no relevant family history of coronary artery disease (first degree relative at less than age 54) and has no history of hypercholesterolemia.   Home Medications Prior to Admission medications   Medication Sig Start Date End Date Taking? Authorizing Provider  acetaminophen (TYLENOL) 650 MG CR tablet Take 1,300 mg by mouth daily as needed for pain.    [provider]  albuterol (ACCUNEB) 0.63 MG/3ML nebulizer solution Inhale into the lungs.  03/24/19   [provider]  albuterol (VENTOLIN HFA) 108 (90 Base) MCG/ACT inhaler Inhale 2 puffs into the lungs every 4 (four) hours as needed for wheezing or shortness of breath. 10/19/18   Varney Biles, MD  amitriptyline (ELAVIL) 100 MG tablet Take 1 tablet (100 mg total) by mouth 2 (two) times daily. 04/26/16   Debbe Odea, MD  aspirin EC 81 MG tablet Take 1 tablet (81 mg total) by mouth daily. 08/26/17   Roney Jaffe, MD  atorvastatin (LIPITOR) 40 MG tablet Take 1 tablet by mouth daily. 03/02/19   [provider]  baclofen (LIORESAL) 10 MG tablet Take 1 tablet by mouth at bedtime as needed. 10/26/19   [provider]  budesonide-formoterol (SYMBICORT) 160-4.5 MCG/ACT inhaler Inhale into the lungs. 11/30/19   [provider]  celecoxib (CELEBREX) 100 MG capsule Take 1 capsule by mouth 2 (two) times daily. 12/10/19   [provider]  DULoxetine (CYMBALTA) 60 MG capsule Take 60 mg by mouth daily. 08/16/17   [provider]  furosemide (LASIX) 40 MG tablet Take 1 tablet (40 mg total) by mouth daily. 08/27/17   Roney Jaffe, MD  furosemide (LASIX) 40 MG tablet Take 1 tablet by mouth daily. 11/03/19   [provider]  gabapentin (NEURONTIN) 400 MG capsule TAKE 3 CAPSULES (1200 MG TOTAL) BY MOUTH 3 TIMES DAILY. 10/26/19   [provider]  HYDROcodone-acetaminophen (NORCO/VICODIN) 5-325 MG tablet Take 1 tablet by mouth every 6 (six) hours as needed for moderate pain.    [provider]  hydrOXYzine (ATARAX/VISTARIL) 25 MG tablet Take 2 tablets (50 mg total) by mouth every 6 (six) hours as needed for anxiety. 02/22/17   Gareth Morgan, MD  ipratropium-albuterol (DUONEB) 0.5-2.5 (3) MG/3ML SOLN Take 3 mLs by nebulization 2 (two) times daily. Patient taking differently: Take 3 mLs by nebulization 2 (two) times daily as needed (shortness of breath/wheezing).  02/02/17   Hosie Poisson, MD  loratadine (CLARITIN) 10 MG tablet Take 10  mg by mouth daily. 09/01/18   [provider]  losartan (COZAAR) 25 MG tablet Take 1 tablet (25 mg total) by mouth daily. 04/26/16   Debbe Odea, MD  metoprolol tartrate (LOPRESSOR) 25 MG tablet Take 1 tablet (25 mg total) by mouth 2 (two) times daily. 04/26/16   Debbe Odea, MD  metoprolol tartrate (LOPRESSOR) 25 MG tablet Take 1 tablet by mouth 2 (two) times daily. 10/23/19   [provider]  montelukast (SINGULAIR) 10 MG tablet Take 1 tablet (10 mg total) by mouth daily. 08/26/17 11/26/18  Roney Jaffe, MD  montelukast (SINGULAIR) 10 MG tablet Take 1 tablet by mouth daily. 12/07/19   [provider]  polyethylene glycol (MIRALAX / GLYCOLAX) packet Take 17 g by mouth daily as needed for mild constipation. 04/26/16   Debbe Odea, MD  potassium chloride SA (K-DUR,KLOR-CON) 20 MEQ tablet Take 1 tablet (20 mEq total) by mouth daily as needed (always take with the Furosemide). 04/26/16   Debbe Odea, MD  predniSONE (STERAPRED UNI-PAK 21 TAB) 10 MG (21) TBPK tablet Take by mouth daily. Take 6 tabs by mouth daily  for 2 days, then 5 tabs for 2 days, then 4 tabs for 2 days, then 3 tabs for 2 days, 2 tabs for 2 days, then 1 tab by mouth daily for 2 days 12/28/19   Caccavale, Sophia, PA-C  pregabalin (LYRICA) 200 MG capsule Take 200 mg by mouth 3 (three) times daily. 11/11/18   [provider]  pregabalin (LYRICA) 50 MG capsule Take by mouth. 10/26/19   [provider]  rOPINIRole (REQUIP) 0.5 MG tablet Take 0.5 mg by mouth at bedtime. 11/11/18   [provider]  topiramate (TOPAMAX) 50 MG tablet Take by mouth. 10/26/19 01/24/20  [provider]      Allergies    Latex    Review of Systems   Review of Systems  Constitutional:  Negative for diaphoresis and fever.  Respiratory:  Positive for cough and shortness of breath.   Cardiovascular:  Positive for chest pain.  Gastrointestinal:  Negative for abdominal pain, nausea and vomiting.  Neurological:   Positive for dizziness. Negative for syncope.  All other systems reviewed and are negative.  Physical Exam Updated Vital Signs BP 125/82 (BP Location: Right Arm)   Pulse 88   Temp (!) 97.4 F (36.3 C) (Oral)   Resp 16   Ht 5\' 6"  (1.676 m)   Wt 109.8 kg   SpO2 99%   BMI 39.07 kg/m  Physical Exam Vitals and nursing note reviewed.  Constitutional:      Appearance: Normal appearance.  HENT:     Head: Normocephalic and atraumatic.  Eyes:     Conjunctiva/sclera: Conjunctivae normal.  Cardiovascular:     Rate and Rhythm: Normal rate and regular rhythm.  Pulmonary:     Effort: Pulmonary effort is normal. No respiratory distress.     Breath sounds: Normal breath sounds. No wheezing.  Chest:     Comments: Reproducible tenderness palpation of the left anterior chest Abdominal:  General: There is no distension.     Palpations: Abdomen is soft.     Tenderness: There is no abdominal tenderness.  Musculoskeletal:     Right lower leg: No edema.     Left lower leg: No edema.  Skin:    General: Skin is warm and dry.  Neurological:     General: No focal deficit present.     Mental Status: She is alert.    ED Results / Procedures / Treatments   Labs (all labs ordered are listed, but only abnormal results are displayed) Labs Reviewed  BASIC METABOLIC PANEL - Abnormal; Notable for the following components:      Result Value   Potassium 3.2 (*)    Glucose, Bld 107 (*)    All other components within normal limits  RESP PANEL BY RT-PCR (FLU A&B, COVID) ARPGX2  CBC  PREGNANCY, URINE  BRAIN NATRIURETIC PEPTIDE  TROPONIN I (HIGH SENSITIVITY)  TROPONIN I (HIGH SENSITIVITY)    EKG None  Radiology DG Chest 2 View  Result Date: 07/12/2021 CLINICAL DATA:  Chest pain. EXAM: CHEST - 2 VIEW COMPARISON:  March 14, 2021. FINDINGS: The heart size and mediastinal contours are within normal limits. Hypoinflation of the lungs is noted with minimal bibasilar subsegmental atelectasis.  The visualized skeletal structures are unremarkable. IMPRESSION: Hypoinflation of the lungs with minimal bibasilar subsegmental atelectasis. Electronically Signed   By: Marijo Conception M.D.   On: 07/12/2021 19:59    Procedures Procedures    Medications Ordered in ED Medications  albuterol (PROVENTIL) (2.5 MG/3ML) 0.083% nebulizer solution 2.5 mg (2.5 mg Nebulization Given 07/12/21 2105)  ketorolac (TORADOL) injection 60 mg (60 mg Intramuscular Given 07/12/21 2332)    ED Course/ Medical Decision Making/ A&P   HEAR Score: 3                       Medical Decision Making Amount and/or Complexity of Data Reviewed Labs: ordered. Radiology: ordered.  Risk Prescription drug management.   This patient is a 56 y.o. female who presents to the ED for concern of chest pain, this involves an extensive number of treatment options, and is a complaint that carries with it a high risk of complications and morbidity. The emergent differential diagnosis prior to evaluation includes, but is not limited to,  ACS, pericarditis, aortic dissection, PE, pneumothorax, esophageal spasm or rupture, chronic angina, valvular disease, cardiomyopathy, myocarditis, pulmonary HTN, pneumonia, bronchitis, GERD, reflux/PUD, biliary disease, pancreatitis, disk disease, costochondritis, anxiety or panic attack. This is not an exhaustive differential.   Past Medical History / Co-morbidities / Social History: Asthma, CHF, hypertension  Additional history: Chart reviewed. Pertinent results include: Patient states that she was seen for similar symptoms back in January.  Upon chart review, patient was seen at this facility on January 17 with a very similar presentation.  At that time she had a benign work-up, and was given a shot of Toradol.  She states that this significantly improved her symptoms.  Physical Exam: Physical exam performed. The pertinent findings include: Normal vital signs.  Lung sounds clear to auscultation  bilaterally, with slightly more shallow breaths.  Reproducible tenderness to palpation of the left anterior chest.  Lab Tests: I ordered, and personally interpreted labs.  The pertinent results include: No leukocytosis, normal hemoglobin.  Mild hypokalemia 3.2, otherwise electrolytes grossly within normal limits.  Troponin of less than 2.  BNP of 21.7.  Negative COVID and flu.   Imaging Studies: I ordered  imaging studies including chest x-ray. I independently visualized and interpreted imaging which showed hypoinflation of lungs, otherwise unremarkable.  I agree with the radiologist interpretation.   Cardiac Monitoring:  The patient was maintained on a cardiac monitor.  My attending physician Dr. Tamera Punt viewed and interpreted the cardiac monitored which showed an underlying rhythm of: Sinus rhythm.  I agree with this interpretation.   Medications: I ordered medication including nebulizer treatment and Toradol for chest tightness. Reevaluation of the patient after these medicines showed that the patient improved. I have reviewed the patients home medicines and have made adjustments as needed.  Disposition: After consideration of the diagnostic results and the patients response to treatment, I feel that patient is to be discharged with recommendation to follow up with PCP in regards to today's hospital visit. Chest pain is not likely of cardiac or pulmonary etiology d/t presentation, PERC criteria for PE negative, VSS, no tracheal deviation, no JVD or new murmur, RRR, breath sounds equal bilaterally, EKG without acute abnormalities, negative troponin, and negative CXR. Heart score of 3. Pt has been advised to return to the ED if CP becomes exertional, associated with diaphoresis or nausea, radiates to left jaw/arm, worsens or becomes concerning in any way. Pt appears reliable for follow up and is agreeable to discharge.   Final Clinical Impression(s) / ED Diagnoses Final diagnoses:  Nonspecific chest  pain    Rx / DC Orders ED Discharge Orders     None      Portions of this report may have been transcribed using voice recognition software. Every effort was made to ensure accuracy; however, inadvertent computerized transcription errors may be present.    Estill Cotta 07/13/21 1521    Malvin Johns, MD 07/13/21 2010

## 2021-07-16 ENCOUNTER — Emergency Department (HOSPITAL_BASED_OUTPATIENT_CLINIC_OR_DEPARTMENT_OTHER): Payer: 59

## 2021-07-16 ENCOUNTER — Encounter (HOSPITAL_BASED_OUTPATIENT_CLINIC_OR_DEPARTMENT_OTHER): Payer: Self-pay | Admitting: Emergency Medicine

## 2021-07-16 ENCOUNTER — Emergency Department (HOSPITAL_BASED_OUTPATIENT_CLINIC_OR_DEPARTMENT_OTHER)
Admission: EM | Admit: 2021-07-16 | Discharge: 2021-07-16 | Disposition: A | Discharge status: Home / Self Care | Payer: 59 | Attending: Emergency Medicine | Admitting: Emergency Medicine

## 2021-07-16 ENCOUNTER — Other Ambulatory Visit: Payer: Self-pay

## 2021-07-16 DIAGNOSIS — Z7982 Long term (current) use of aspirin: Secondary | ICD-10-CM | POA: Diagnosis not present

## 2021-07-16 DIAGNOSIS — M797 Fibromyalgia: Secondary | ICD-10-CM | POA: Insufficient documentation

## 2021-07-16 DIAGNOSIS — W010XXA Fall on same level from slipping, tripping and stumbling without subsequent striking against object, initial encounter: Secondary | ICD-10-CM | POA: Diagnosis not present

## 2021-07-16 DIAGNOSIS — I110 Hypertensive heart disease with heart failure: Secondary | ICD-10-CM | POA: Insufficient documentation

## 2021-07-16 DIAGNOSIS — Z79899 Other long term (current) drug therapy: Secondary | ICD-10-CM | POA: Diagnosis not present

## 2021-07-16 DIAGNOSIS — S8992XA Unspecified injury of left lower leg, initial encounter: Secondary | ICD-10-CM | POA: Insufficient documentation

## 2021-07-16 DIAGNOSIS — R202 Paresthesia of skin: Secondary | ICD-10-CM | POA: Diagnosis not present

## 2021-07-16 DIAGNOSIS — J45909 Unspecified asthma, uncomplicated: Secondary | ICD-10-CM | POA: Insufficient documentation

## 2021-07-16 DIAGNOSIS — Z91040 Latex allergy status: Secondary | ICD-10-CM | POA: Insufficient documentation

## 2021-07-16 DIAGNOSIS — I5032 Chronic diastolic (congestive) heart failure: Secondary | ICD-10-CM | POA: Insufficient documentation

## 2021-07-16 DIAGNOSIS — Z8673 Personal history of transient ischemic attack (TIA), and cerebral infarction without residual deficits: Secondary | ICD-10-CM | POA: Insufficient documentation

## 2021-07-16 DIAGNOSIS — M25562 Pain in left knee: Secondary | ICD-10-CM

## 2021-07-16 DIAGNOSIS — W19XXXA Unspecified fall, initial encounter: Secondary | ICD-10-CM

## 2021-07-16 NOTE — ED Triage Notes (Signed)
Reports fall yesterday , left side pain , left arm and left leg  pain . Pt unable to ambulate due to pain , reports numbness to left arm and shoulder . Hx neuropathy she said .

## 2021-07-16 NOTE — ED Provider Notes (Signed)
[facility] EMERGENCY DEPARTMENT Provider Note   CSN: [id number] Arrival date & time: 07/16/21  1138     History  Chief Complaint  Patient presents with   Melinda Woods    Melinda RoysLaura Woods is a 56 y.o. female.  56 year old female past medical history of hypertension, RA, CHF, asthma, fibromyalgia presents for evaluation of numbness in her left hand and left knee pain.  Patient states that she was in this emergency room 3 days ago for swelling, went home and has been resting.  Patient got out of bed yesterday without assistance, slipped and fell injuring her left knee.  Did not hit head, no loss of consciousness, has been ambulatory since the fall without difficulty.  Reports generalized knee pain.  Woke up this morning with complaint of numbness in her left hand along her fourth and fifth fingers.  Denies any weakness, changes in vision, speech, gait.  No other complaints or concerns.      Home Medications Prior to Admission medications   Medication Sig Start Date End Date Taking? Authorizing Provider  acetaminophen (TYLENOL) 650 MG CR tablet Take 1,300 mg by mouth daily as needed for pain.    [provider]  albuterol (ACCUNEB) 0.63 MG/3ML nebulizer solution Inhale into the lungs. 03/24/19   [provider]  albuterol (VENTOLIN HFA) 108 (90 Base) MCG/ACT inhaler Inhale 2 puffs into the lungs every 4 (four) hours as needed for wheezing or shortness of breath. 10/19/18   Derwood KaplanNanavati, Ankit, MD  amitriptyline (ELAVIL) 100 MG tablet Take 1 tablet (100 mg total) by mouth 2 (two) times daily. 04/26/16   Calvert Cantorizwan, Saima, MD  aspirin EC 81 MG tablet Take 1 tablet (81 mg total) by mouth daily. 08/26/17   Delano MetzSchertz, Robert, MD  atorvastatin (LIPITOR) 40 MG tablet Take 1 tablet by mouth daily. 03/02/19   [provider]  baclofen (LIORESAL) 10 MG tablet Take 1 tablet by mouth at bedtime as needed. 10/26/19   [provider]  budesonide-formoterol (SYMBICORT) 160-4.5 MCG/ACT  inhaler Inhale into the lungs. 11/30/19   [provider]  celecoxib (CELEBREX) 100 MG capsule Take 1 capsule by mouth 2 (two) times daily. 12/10/19   [provider]  DULoxetine (CYMBALTA) 60 MG capsule Take 60 mg by mouth daily. 08/16/17   [provider]  furosemide (LASIX) 40 MG tablet Take 1 tablet (40 mg total) by mouth daily. 08/27/17   Delano MetzSchertz, Robert, MD  furosemide (LASIX) 40 MG tablet Take 1 tablet by mouth daily. 11/03/19   [provider]  gabapentin (NEURONTIN) 400 MG capsule TAKE 3 CAPSULES (1200 MG TOTAL) BY MOUTH 3 TIMES DAILY. 10/26/19   [provider]  HYDROcodone-acetaminophen (NORCO/VICODIN) 5-325 MG tablet Take 1 tablet by mouth every 6 (six) hours as needed for moderate pain.    [provider]  hydrOXYzine (ATARAX/VISTARIL) 25 MG tablet Take 2 tablets (50 mg total) by mouth every 6 (six) hours as needed for anxiety. 02/22/17   Alvira MondaySchlossman, Erin, MD  ipratropium-albuterol (DUONEB) 0.5-2.5 (3) MG/3ML SOLN Take 3 mLs by nebulization 2 (two) times daily. Patient taking differently: Take 3 mLs by nebulization 2 (two) times daily as needed (shortness of breath/wheezing).  02/02/17   Kathlen ModyAkula, Vijaya, MD  loratadine (CLARITIN) 10 MG tablet Take 10 mg by mouth daily. 09/01/18   [provider]  losartan (COZAAR) 25 MG tablet Take 1 tablet (25 mg total) by mouth daily. 04/26/16   Calvert Cantorizwan, Saima, MD  metoprolol tartrate (LOPRESSOR) 25 MG tablet Take 1  tablet (25 mg total) by mouth 2 (two) times daily. 04/26/16   Calvert Cantor, MD  metoprolol tartrate (LOPRESSOR) 25 MG tablet Take 1 tablet by mouth 2 (two) times daily. 10/23/19   [provider]  montelukast (SINGULAIR) 10 MG tablet Take 1 tablet (10 mg total) by mouth daily. 08/26/17 11/26/18  Delano Metz, MD  montelukast (SINGULAIR) 10 MG tablet Take 1 tablet by mouth daily. 12/07/19   [provider]  polyethylene glycol (MIRALAX / GLYCOLAX) packet Take 17 g by mouth daily  as needed for mild constipation. 04/26/16   Calvert Cantor, MD  potassium chloride SA (K-DUR,KLOR-CON) 20 MEQ tablet Take 1 tablet (20 mEq total) by mouth daily as needed (always take with the Furosemide). 04/26/16   Calvert Cantor, MD  predniSONE (STERAPRED UNI-PAK 21 TAB) 10 MG (21) TBPK tablet Take by mouth daily. Take 6 tabs by mouth daily  for 2 days, then 5 tabs for 2 days, then 4 tabs for 2 days, then 3 tabs for 2 days, 2 tabs for 2 days, then 1 tab by mouth daily for 2 days 12/28/19   Caccavale, Sophia, PA-C  pregabalin (LYRICA) 200 MG capsule Take 200 mg by mouth 3 (three) times daily. 11/11/18   [provider]  pregabalin (LYRICA) 50 MG capsule Take by mouth. 10/26/19   [provider]  rOPINIRole (REQUIP) 0.5 MG tablet Take 0.5 mg by mouth at bedtime. 11/11/18   [provider]  topiramate (TOPAMAX) 50 MG tablet Take by mouth. 10/26/19 01/24/20  [provider]      Allergies    Latex    Review of Systems   Review of Systems Negative except as per HPI Physical Exam Updated Vital Signs BP (!) 112/94   Pulse 100   Temp 99.3 F (37.4 C) (Oral)   Resp 18   SpO2 94%  Physical Exam Vitals and nursing note reviewed.  Constitutional:      General: She is not in acute distress.    Appearance: She is well-developed. She is not diaphoretic.  HENT:     Head: Normocephalic and atraumatic.     Nose: Nose normal.     Mouth/Throat:     Mouth: Mucous membranes are moist.  Eyes:     Extraocular Movements: Extraocular movements intact.     Pupils: Pupils are equal, round, and reactive to light.  Cardiovascular:     Rate and Rhythm: Normal rate and regular rhythm.     Heart sounds: Normal heart sounds.  Pulmonary:     Effort: Pulmonary effort is normal.     Breath sounds: Normal breath sounds.  Abdominal:     Palpations: Abdomen is soft.     Tenderness: There is no abdominal tenderness.  Musculoskeletal:        General: Tenderness present. No swelling or  deformity.     Cervical back: Neck supple.     Right hip: Normal.     Left hip: Normal.     Left lower leg: Tenderness present. No swelling, deformity or lacerations. No edema.     Comments: Reports decreased sensation over left fourth and fifth fingers, capillary refill intact, strength intact.  Skin:    General: Skin is warm and dry.     Findings: No bruising or erythema.  Neurological:     Mental Status: She is alert and oriented to person, place, and time.     GCS: GCS eye subscore is 4. GCS verbal subscore is 5. GCS motor subscore  is 6.     Cranial Nerves: No cranial nerve deficit.     Sensory: Sensory deficit present.     Motor: No weakness or pronator drift.     Gait: Gait is intact.  Psychiatric:        Behavior: Behavior normal.    ED Results / Procedures / Treatments   Labs (all labs ordered are listed, but only abnormal results are displayed) Labs Reviewed - No data to display  EKG None  Radiology CT Head Wo Contrast  Result Date: 07/16/2021 CLINICAL DATA:  Trauma, fall EXAM: CT HEAD WITHOUT CONTRAST TECHNIQUE: Contiguous axial images were obtained from the base of the skull through the vertex without intravenous contrast. RADIATION DOSE REDUCTION: This exam was performed according to the departmental dose-optimization program which includes automated exposure control, adjustment of the mA and/or kV according to patient size and/or use of iterative reconstruction technique. COMPARISON:  03/13/2014 FINDINGS: Brain: No acute intracranial findings are seen. There are no signs of bleeding. Ventricles are not dilated. There is no focal mass effect. Vascular: Unremarkable. Skull: No fracture is seen in the calvarium. Sinuses/Orbits: There is mild mucosal thickening in the ethmoid and sphenoid sinuses. Right maxillary sinus appears smaller than left. Other: None. IMPRESSION: No acute intracranial findings are seen in noncontrast CT brain. Electronically Signed   By: Ernie Avena M.D.   On: 07/16/2021 12:45   DG Knee Complete 4 Views Left  Result Date: 07/16/2021 CLINICAL DATA:  Trauma, fall, pain EXAM: LEFT KNEE - COMPLETE 4+ VIEW COMPARISON:  None Available. FINDINGS: No evidence of fracture, dislocation, or joint effusion. No evidence of arthropathy or other focal bone abnormality. Soft tissues are unremarkable. IMPRESSION: No fracture or dislocation is seen in the left knee. Electronically Signed   By: Ernie Avena M.D.   On: 07/16/2021 12:46   DG Hand Complete Left  Result Date: 07/16/2021 CLINICAL DATA:  Trauma, fall, pain EXAM: LEFT HAND - COMPLETE 3+ VIEW COMPARISON:  04/05/2020 FINDINGS: There is no evidence of fracture or dislocation. There is no evidence of arthropathy or other focal bone abnormality. Soft tissues are unremarkable. IMPRESSION: No fracture or dislocation is seen in the left hand. Electronically Signed   By: Ernie Avena M.D.   On: 07/16/2021 12:50    Procedures Procedures    Medications Ordered in ED Medications - No data to display  ED Course/ Medical Decision Making/ A&P                           Medical Decision Making  This patient presents to the ED for concern of numbness of her left fourth and fifth fingers, left knee pain after fall, this involves an extensive number of treatment options, and is a complaint that carries with it a high risk of complications and morbidity.  The differential diagnosis includes but not limited to paresthesia, fracture, dislocation, closed head injury   Co morbidities that complicate the patient evaluation  Hypertension, chronic diastolic CHF, obesity, peripheral neuropathy, TIA, RA   Additional history obtained:  Additional history obtained from husband at bedside who reports minor fall yesterday, acting normal today however was concerned that she had numbness in her fourth and fifth fingers which prompted them to bring her in today. External records from outside source  obtained and reviewed including  Labs from recent visit on Jul 12, 2021 reviewed without significant findings   Imaging Studies ordered:  I ordered imaging studies including  echo her left hand, x-ray right knee, CT head I independently visualized and interpreted imaging which showed no acute bony abnormality or intracranial injury. I agree with the radiologist interpretation  Consultations Obtained:  I requested consultation with the ER attending Dr. Albertina Parr,  and discussed lab and imaging findings as well as pertinent plan - they recommend: CT head out of abundance of precaution, x-ray hand and knee, results reassuring, can follow-up with PCP.   Problem List / ED Course / Critical interventions / Medication management  56 year old female presents with primary concern for numbness in her left fourth and fifth fingers.  No ABThera or abnormal neurological findings present.  X-ray of the left hand is unremarkable.  Discussed possibly secondary to positioning in her sleep as this began overnight at some point.  Also with left knee pain after fall 2 days ago.  CT head secondary to fall is negative for acute injury.  X-ray of the left knee is unremarkable.  Patient is offered reassurance, discharged to follow-up with her primary care provider for recheck. I have reviewed the patients home medicines and have made adjustments as needed   Social Determinants of Health:  Discharged home with husband who helps care for patient with plan for follow-up with PCP.   Test / Admission - Considered:  Labs considered however recently completed in the ER, reviewed and not felt to be repeated at this time.         Final Clinical Impression(s) / ED Diagnoses Final diagnoses:  Fall, initial encounter  Acute pain of left knee  Left hand paresthesia    Rx / DC Orders ED Discharge Orders     None         Emma, Birchler, PA-C 07/16/21 1813    Virgina Norfolk, DO 07/20/21 2310

## 2021-10-22 ENCOUNTER — Emergency Department (HOSPITAL_COMMUNITY)
Admission: EM | Admit: 2021-10-22 | Discharge: 2021-10-23 | Discharge status: Against Medical Advice | Payer: 59 | Attending: Emergency Medicine | Admitting: Emergency Medicine

## 2021-10-22 ENCOUNTER — Other Ambulatory Visit: Payer: Self-pay

## 2021-10-22 ENCOUNTER — Encounter (HOSPITAL_COMMUNITY): Payer: Self-pay

## 2021-10-22 DIAGNOSIS — R7309 Other abnormal glucose: Secondary | ICD-10-CM | POA: Insufficient documentation

## 2021-10-22 DIAGNOSIS — Z5321 Procedure and treatment not carried out due to patient leaving prior to being seen by health care provider: Secondary | ICD-10-CM | POA: Insufficient documentation

## 2021-10-22 DIAGNOSIS — R569 Unspecified convulsions: Secondary | ICD-10-CM | POA: Diagnosis not present

## 2021-10-22 LAB — BASIC METABOLIC PANEL
Anion gap: 6 (ref 5–15)
BUN: 13 mg/dL (ref 6–20)
CO2: 25 mmol/L (ref 22–32)
Calcium: 9.1 mg/dL (ref 8.9–10.3)
Chloride: 109 mmol/L (ref 98–111)
Creatinine, Ser: 1.07 mg/dL — ABNORMAL HIGH (ref 0.44–1.00)
GFR, Estimated: >60 mL/min (ref >60)
Glucose, Bld: 109 mg/dL — ABNORMAL HIGH (ref 70–99)
Potassium: 3.8 mmol/L (ref 3.5–5.1)
Sodium: 140 mmol/L (ref 135–145)

## 2021-10-22 LAB — CBC
HCT: 38.3 % (ref 36.0–46.0)
Hemoglobin: 12.4 g/dL (ref 12.0–15.0)
MCH: 29.3 pg (ref 26.0–34.0)
MCHC: 32.4 g/dL (ref 30.0–36.0)
MCV: 90.5 fL (ref 80.0–100.0)
Platelets: 219 10*3/uL (ref 150–400)
RBC: 4.23 MIL/uL (ref 3.87–5.11)
RDW: 14.7 % (ref 11.5–15.5)
WBC: 5 10*3/uL (ref 4.0–10.5)
nRBC: 0 % (ref 0.0–0.2)

## 2021-10-22 LAB — I-STAT BETA HCG BLOOD, ED (MC, WL, AP ONLY): I-stat hCG, quantitative: <5 m[IU]/mL (ref <5)

## 2021-10-22 LAB — CBG MONITORING, ED: Glucose-Capillary: 102 mg/dL — ABNORMAL HIGH (ref 70–99)

## 2021-10-22 NOTE — ED Notes (Signed)
Melinda Woods wants them to know she's checking on them

## 2021-10-22 NOTE — ED Triage Notes (Signed)
PT BIB EMS for possible seizure. Described as full body twitching. Was not post ictal. No history of seizure.

## 2021-10-23 ENCOUNTER — Emergency Department (HOSPITAL_BASED_OUTPATIENT_CLINIC_OR_DEPARTMENT_OTHER)
Admission: EM | Admit: 2021-10-23 | Discharge: 2021-10-23 | Disposition: A | Discharge status: Home / Self Care | Payer: 59 | Attending: Emergency Medicine | Admitting: Emergency Medicine

## 2021-10-23 ENCOUNTER — Emergency Department (HOSPITAL_BASED_OUTPATIENT_CLINIC_OR_DEPARTMENT_OTHER): Payer: 59

## 2021-10-23 ENCOUNTER — Other Ambulatory Visit: Payer: Self-pay

## 2021-10-23 ENCOUNTER — Encounter (HOSPITAL_BASED_OUTPATIENT_CLINIC_OR_DEPARTMENT_OTHER): Payer: Self-pay | Admitting: Emergency Medicine

## 2021-10-23 DIAGNOSIS — Z91040 Latex allergy status: Secondary | ICD-10-CM | POA: Diagnosis not present

## 2021-10-23 DIAGNOSIS — I509 Heart failure, unspecified: Secondary | ICD-10-CM | POA: Insufficient documentation

## 2021-10-23 DIAGNOSIS — R55 Syncope and collapse: Secondary | ICD-10-CM | POA: Insufficient documentation

## 2021-10-23 DIAGNOSIS — R531 Weakness: Secondary | ICD-10-CM | POA: Diagnosis not present

## 2021-10-23 DIAGNOSIS — Z20822 Contact with and (suspected) exposure to covid-19: Secondary | ICD-10-CM | POA: Insufficient documentation

## 2021-10-23 DIAGNOSIS — Z7982 Long term (current) use of aspirin: Secondary | ICD-10-CM | POA: Diagnosis not present

## 2021-10-23 DIAGNOSIS — F1910 Other psychoactive substance abuse, uncomplicated: Secondary | ICD-10-CM | POA: Insufficient documentation

## 2021-10-23 DIAGNOSIS — Z79899 Other long term (current) drug therapy: Secondary | ICD-10-CM | POA: Diagnosis not present

## 2021-10-23 DIAGNOSIS — I110 Hypertensive heart disease with heart failure: Secondary | ICD-10-CM | POA: Diagnosis not present

## 2021-10-23 LAB — COMPREHENSIVE METABOLIC PANEL
ALT: 11 U/L (ref 0–44)
AST: 13 U/L — ABNORMAL LOW (ref 15–41)
Albumin: 4.2 g/dL (ref 3.5–5.0)
Alkaline Phosphatase: 95 U/L (ref 38–126)
Anion gap: 7 (ref 5–15)
BUN: 15 mg/dL (ref 6–20)
CO2: 25 mmol/L (ref 22–32)
Calcium: 9.7 mg/dL (ref 8.9–10.3)
Chloride: 107 mmol/L (ref 98–111)
Creatinine, Ser: 0.93 mg/dL (ref 0.44–1.00)
GFR, Estimated: >60 mL/min (ref >60)
Glucose, Bld: 86 mg/dL (ref 70–99)
Potassium: 3.6 mmol/L (ref 3.5–5.1)
Sodium: 139 mmol/L (ref 135–145)
Total Bilirubin: 0.3 mg/dL (ref 0.3–1.2)
Total Protein: 7.6 g/dL (ref 6.5–8.1)

## 2021-10-23 LAB — TROPONIN I (HIGH SENSITIVITY)
Troponin I (High Sensitivity): 3 ng/L (ref <18)
Troponin I (High Sensitivity): <2 ng/L (ref <18)

## 2021-10-23 LAB — CBC WITH DIFFERENTIAL/PLATELET
Abs Immature Granulocytes: 0.01 10*3/uL (ref 0.00–0.07)
Basophils Absolute: 0 10*3/uL (ref 0.0–0.1)
Basophils Relative: 0 %
Eosinophils Absolute: 0.1 10*3/uL (ref 0.0–0.5)
Eosinophils Relative: 2 %
HCT: 37.7 % (ref 36.0–46.0)
Hemoglobin: 12.1 g/dL (ref 12.0–15.0)
Immature Granulocytes: 0 %
Lymphocytes Relative: 39 %
Lymphs Abs: 1.8 10*3/uL (ref 0.7–4.0)
MCH: 29.2 pg (ref 26.0–34.0)
MCHC: 32.1 g/dL (ref 30.0–36.0)
MCV: 90.8 fL (ref 80.0–100.0)
Monocytes Absolute: 0.5 10*3/uL (ref 0.1–1.0)
Monocytes Relative: 11 %
Neutro Abs: 2.1 10*3/uL (ref 1.7–7.7)
Neutrophils Relative %: 48 %
Platelets: 201 10*3/uL (ref 150–400)
RBC: 4.15 MIL/uL (ref 3.87–5.11)
RDW: 14.7 % (ref 11.5–15.5)
WBC: 4.5 10*3/uL (ref 4.0–10.5)
nRBC: 0 % (ref 0.0–0.2)

## 2021-10-23 LAB — CBG MONITORING, ED: Glucose-Capillary: 90 mg/dL (ref 70–99)

## 2021-10-23 LAB — URINALYSIS, ROUTINE W REFLEX MICROSCOPIC
Bilirubin Urine: NEGATIVE
Glucose, UA: NEGATIVE mg/dL
Hgb urine dipstick: NEGATIVE
Ketones, ur: NEGATIVE mg/dL
Leukocytes,Ua: NEGATIVE
Nitrite: NEGATIVE
Protein, ur: NEGATIVE mg/dL
Specific Gravity, Urine: >=1.03 (ref 1.005–1.030)
pH: 5.5 (ref 5.0–8.0)

## 2021-10-23 LAB — RESP PANEL BY RT-PCR (FLU A&B, COVID) ARPGX2
Influenza A by PCR: NEGATIVE
Influenza B by PCR: NEGATIVE
SARS Coronavirus 2 by RT PCR: NEGATIVE

## 2021-10-23 LAB — RAPID URINE DRUG SCREEN, HOSP PERFORMED
Amphetamines: NOT DETECTED
Barbiturates: NOT DETECTED
Benzodiazepines: NOT DETECTED
Cocaine: POSITIVE — AB
Opiates: NOT DETECTED
Tetrahydrocannabinol: POSITIVE — AB

## 2021-10-23 LAB — TSH: TSH: 0.769 u[IU]/mL (ref 0.350–4.500)

## 2021-10-23 LAB — BRAIN NATRIURETIC PEPTIDE: B Natriuretic Peptide: 11 pg/mL (ref 0.0–100.0)

## 2021-10-23 LAB — T4, FREE: Free T4: 0.76 ng/dL (ref 0.61–1.12)

## 2021-10-23 LAB — ETHANOL: Alcohol, Ethyl (B): <10 mg/dL (ref <10)

## 2021-10-23 LAB — PREGNANCY, URINE: Preg Test, Ur: NEGATIVE

## 2021-10-23 NOTE — ED Triage Notes (Signed)
Pt states she had an event at home last night.  States she has been feeling bad for a few days.  Her mother has had cold symptoms.  Pt states she doesn't recall what happened but a friend saw her shaking and her mouth droop.  She doesn't know how long it happened and EMS was called.  Pt went to [facility] but LWBS after triage as she didn't want to wait anymore.  Pt states she feels weak, has some dizziness and doesn't feel well.

## 2021-10-23 NOTE — ED Notes (Signed)
Patient states the wait is too long and she will follow up with her pcp in the morning. IV removed by this Clinical research associate

## 2021-10-23 NOTE — ED Notes (Signed)
PT ambulated down hallway without incident, felt good walking with no assistance.

## 2021-10-23 NOTE — ED Provider Notes (Signed)
[facility] EMERGENCY DEPARTMENT Provider Note   CSN: [id number] Arrival date & time: 10/23/21  7543     History Chief Complaint  Patient presents with   Weakness    Melinda Woods is a 56 y.o. female with h/o CHF, HTN, HLD presents the emergency department for potential syncopal episodes.  Patient reports that she has been feeling weakness for the past 2 days.  Yesterday, she had a syncopal episode and her husband called EMS and she was taken to [facility] but left prior to be evaluated.  Today, she reports that she was standing up walking in a kitchen to get breakfast when she had a syncopal episode.  Denies hitting her head or neck.  Denies any other bodily pain.  She reports that she is feeling some chest pressure since yesterday but denies any shortness of breath.  Reports some rhinorrhea and nasal congestion.  Denies any abdominal pain, nausea, vomiting, headache, trouble walking, trouble talking.  She reports that before these "syncopal episodes" that she felt lightheaded before.  She reports that when she was riding with EMS yesterday, they had to give her medication to wake her up.  No known drug allergies.  Denies any tobacco, EtOH, or illicit drug use ever.  She reports compliancy with her medications.   Weakness Associated symptoms: chest pain   Associated symptoms: no abdominal pain, no dysuria, no fever, no headaches, no nausea, no shortness of breath and no vomiting        Home Medications Prior to Admission medications   Medication Sig Start Date End Date Taking? Authorizing Provider  acetaminophen (TYLENOL) 650 MG CR tablet Take 1,300 mg by mouth daily as needed for pain.    [provider]  albuterol (ACCUNEB) 0.63 MG/3ML nebulizer solution Inhale into the lungs. 03/24/19   [provider]  albuterol (VENTOLIN HFA) 108 (90 Base) MCG/ACT inhaler Inhale 2 puffs into the lungs every 4 (four) hours as needed for wheezing or shortness of breath. 10/19/18    Derwood Kaplan, MD  amitriptyline (ELAVIL) 100 MG tablet Take 1 tablet (100 mg total) by mouth 2 (two) times daily. 04/26/16   Calvert Cantor, MD  aspirin EC 81 MG tablet Take 1 tablet (81 mg total) by mouth daily. 08/26/17   Delano Metz, MD  atorvastatin (LIPITOR) 40 MG tablet Take 1 tablet by mouth daily. 03/02/19   [provider]  baclofen (LIORESAL) 10 MG tablet Take 1 tablet by mouth at bedtime as needed. 10/26/19   [provider]  budesonide-formoterol (SYMBICORT) 160-4.5 MCG/ACT inhaler Inhale into the lungs. 11/30/19   [provider]  celecoxib (CELEBREX) 100 MG capsule Take 1 capsule by mouth 2 (two) times daily. 12/10/19   [provider]  DULoxetine (CYMBALTA) 60 MG capsule Take 60 mg by mouth daily. 08/16/17   [provider]  furosemide (LASIX) 40 MG tablet Take 1 tablet (40 mg total) by mouth daily. 08/27/17   Delano Metz, MD  furosemide (LASIX) 40 MG tablet Take 1 tablet by mouth daily. 11/03/19   [provider]  gabapentin (NEURONTIN) 400 MG capsule TAKE 3 CAPSULES (1200 MG TOTAL) BY MOUTH 3 TIMES DAILY. 10/26/19   [provider]  HYDROcodone-acetaminophen (NORCO/VICODIN) 5-325 MG tablet Take 1 tablet by mouth every 6 (six) hours as needed for moderate pain.    [provider]  hydrOXYzine (ATARAX/VISTARIL) 25 MG tablet Take 2 tablets (50 mg total) by mouth every 6 (six) hours as needed for anxiety. 02/22/17  Alvira Monday, MD  ipratropium-albuterol (DUONEB) 0.5-2.5 (3) MG/3ML SOLN Take 3 mLs by nebulization 2 (two) times daily. Patient taking differently: Take 3 mLs by nebulization 2 (two) times daily as needed (shortness of breath/wheezing).  02/02/17   Kathlen Mody, MD  loratadine (CLARITIN) 10 MG tablet Take 10 mg by mouth daily. 09/01/18   [provider]  losartan (COZAAR) 25 MG tablet Take 1 tablet (25 mg total) by mouth daily. 04/26/16   Calvert Cantor, MD  metoprolol tartrate (LOPRESSOR) 25 MG  tablet Take 1 tablet (25 mg total) by mouth 2 (two) times daily. 04/26/16   Calvert Cantor, MD  metoprolol tartrate (LOPRESSOR) 25 MG tablet Take 1 tablet by mouth 2 (two) times daily. 10/23/19   [provider]  montelukast (SINGULAIR) 10 MG tablet Take 1 tablet (10 mg total) by mouth daily. 08/26/17 11/26/18  Delano Metz, MD  montelukast (SINGULAIR) 10 MG tablet Take 1 tablet by mouth daily. 12/07/19   [provider]  polyethylene glycol (MIRALAX / GLYCOLAX) packet Take 17 g by mouth daily as needed for mild constipation. 04/26/16   Calvert Cantor, MD  potassium chloride SA (K-DUR,KLOR-CON) 20 MEQ tablet Take 1 tablet (20 mEq total) by mouth daily as needed (always take with the Furosemide). 04/26/16   Calvert Cantor, MD  predniSONE (STERAPRED UNI-PAK 21 TAB) 10 MG (21) TBPK tablet Take by mouth daily. Take 6 tabs by mouth daily  for 2 days, then 5 tabs for 2 days, then 4 tabs for 2 days, then 3 tabs for 2 days, 2 tabs for 2 days, then 1 tab by mouth daily for 2 days 12/28/19   Caccavale, Sophia, PA-C  pregabalin (LYRICA) 200 MG capsule Take 200 mg by mouth 3 (three) times daily. 11/11/18   [provider]  pregabalin (LYRICA) 50 MG capsule Take by mouth. 10/26/19   [provider]  rOPINIRole (REQUIP) 0.5 MG tablet Take 0.5 mg by mouth at bedtime. 11/11/18   [provider]  topiramate (TOPAMAX) 50 MG tablet Take by mouth. 10/26/19 01/24/20  [provider]      Allergies    Latex    Review of Systems   Review of Systems  Constitutional:  Negative for chills, diaphoresis and fever.  HENT:  Positive for congestion and rhinorrhea.   Eyes:  Negative for visual disturbance.  Respiratory:  Negative for shortness of breath.   Cardiovascular:  Positive for chest pain.  Gastrointestinal:  Negative for abdominal pain, nausea and vomiting.  Genitourinary:  Negative for dysuria and hematuria.  Musculoskeletal:  Negative for gait problem.  Neurological:   Positive for syncope and weakness. Negative for speech difficulty, numbness and headaches.    Physical Exam Updated Vital Signs BP 134/87   Pulse 84   Temp (!) 97.3 F (36.3 C)   Resp 14   Ht 5\' 6"  (1.676 m)   Wt 109.8 kg   LMP 03/11/2021   SpO2 97%   BMI 39.07 kg/m  Physical Exam Vitals and nursing note reviewed.  Constitutional:      General: She is not in acute distress.    Appearance: Normal appearance. She is not ill-appearing or toxic-appearing.  HENT:     Head: Normocephalic and atraumatic.     Comments: No step-offs or deformities.  No battle signs or raccoon eyes.  Nontender to touch.    Nose: Nose normal.     Mouth/Throat:     Mouth: Mucous membranes are moist.  Eyes:  General: No scleral icterus.    Extraocular Movements: Extraocular movements intact.     Pupils: Pupils are equal, round, and reactive to light.  Neck:     Comments: Full range of motion of neck without pain.  No midline or paraspinal tenderness.  No step-offs or deformities. Cardiovascular:     Rate and Rhythm: Normal rate and regular rhythm.  Pulmonary:     Effort: Pulmonary effort is normal. No respiratory distress.     Breath sounds: Normal breath sounds.     Comments: Clear to auscultation bilaterally.  Patient speaking in full sentences with ease satting on room air without any increased work of breathing. Abdominal:     General: Bowel sounds are normal. There is no distension.     Palpations: Abdomen is soft.     Tenderness: There is no abdominal tenderness. There is no guarding or rebound.  Musculoskeletal:        General: No tenderness or deformity.     Cervical back: Normal range of motion. No rigidity or tenderness.     Right lower leg: No edema.     Left lower leg: No edema.     Comments: No midline or paraspinal cervical, thoracic, or lumbar tenderness to palpation.  No erythema or warmth to the spine.  No step-offs or deformities noted.  No pain to be elicited upon palpation of  the upper and lower bilateral extremities.  No step-offs tenderness.  Palpable pulses throughout.  Brisk cap refill.  Compartments are soft.  No overlying skin changes or signs of trauma noted.  Lymphadenopathy:     Cervical: No cervical adenopathy.  Skin:    General: Skin is warm and dry.     Capillary Refill: Capillary refill takes less than 2 seconds.  Neurological:     General: No focal deficit present.     Mental Status: She is alert. Mental status is at baseline.     Cranial Nerves: No cranial nerve deficit.     Sensory: No sensory deficit.     Motor: No weakness.     Gait: Gait normal.     ED Results / Procedures / Treatments   Labs (all labs ordered are listed, but only abnormal results are displayed) Labs Reviewed  URINALYSIS, ROUTINE W REFLEX MICROSCOPIC - Abnormal; Notable for the following components:      Result Value   APPearance HAZY (*)    All other components within normal limits  RAPID URINE DRUG SCREEN, HOSP PERFORMED - Abnormal; Notable for the following components:   Cocaine POSITIVE (*)    Tetrahydrocannabinol POSITIVE (*)    All other components within normal limits  COMPREHENSIVE METABOLIC PANEL - Abnormal; Notable for the following components:   AST 13 (*)    All other components within normal limits  RESP PANEL BY RT-PCR (FLU A&B, COVID) ARPGX2  ETHANOL  PREGNANCY, URINE  CBC WITH DIFFERENTIAL/PLATELET  BRAIN NATRIURETIC PEPTIDE  TSH  T4, FREE  CBG MONITORING, ED  TROPONIN I (HIGH SENSITIVITY)  TROPONIN I (HIGH SENSITIVITY)    EKG EKG Interpretation  Date/Time:  Monday October 23 2021 09:12:53 EDT Ventricular Rate:  86 PR Interval:  146 QRS Duration: 82 QT Interval:  368 QTC Calculation: 440 R Axis:   30 Text Interpretation: Normal sinus rhythm Low voltage QRS Borderline ECG When compared with ECG of 22-Oct-2021 21:33, PREVIOUS ECG IS PRESENT Confirmed by Ernie Avena ([id number]) on 10/23/2021 9:45:13 AM  Radiology DG Chest 2 View  Result  Date: 10/23/2021  CLINICAL DATA:  Chest pain EXAM: CHEST - 2 VIEW COMPARISON:  07/12/2021 FINDINGS: Normal heart size and mediastinal contours. No acute infiltrate or edema. No effusion or pneumothorax. No acute osseous findings. IMPRESSION: Negative chest. Electronically Signed   By: Tiburcio Pea M.D.   On: 10/23/2021 11:33   CT Head Wo Contrast  Result Date: 10/23/2021 CLINICAL DATA:  Syncopal episode, seizure like activity EXAM: CT HEAD WITHOUT CONTRAST CT CERVICAL SPINE WITHOUT CONTRAST TECHNIQUE: Multidetector CT imaging of the head and cervical spine was performed following the standard protocol without intravenous contrast. Multiplanar CT image reconstructions of the cervical spine were also generated. RADIATION DOSE REDUCTION: This exam was performed according to the departmental dose-optimization program which includes automated exposure control, adjustment of the mA and/or kV according to patient size and/or use of iterative reconstruction technique. COMPARISON:  07/16/2021 FINDINGS: CT HEAD FINDINGS Brain: No evidence of acute infarction, hemorrhage, hydrocephalus, extra-axial collection or mass lesion/mass effect. Vascular: No hyperdense vessel or unexpected calcification. Skull: Normal. Negative for fracture or focal lesion. Sinuses/Orbits: No acute finding. Other: None. CT CERVICAL SPINE FINDINGS Alignment: Normal. Skull base and vertebrae: No acute fracture. No primary bone lesion or focal pathologic process. Soft tissues and spinal canal: No prevertebral fluid or swelling. No visible canal hematoma. Disc levels: Mild disc space height loss and osteophytosis at C5-C6, with otherwise preserved disc spaces. Upper chest: Negative. Other: None. IMPRESSION: 1. No acute intracranial pathology. 2. No fracture or static subluxation of the cervical spine. Electronically Signed   By: Jearld Lesch M.D.   On: 10/23/2021 11:29   CT Cervical Spine Wo Contrast  Result Date: 10/23/2021 CLINICAL DATA:   Syncopal episode, seizure like activity EXAM: CT HEAD WITHOUT CONTRAST CT CERVICAL SPINE WITHOUT CONTRAST TECHNIQUE: Multidetector CT imaging of the head and cervical spine was performed following the standard protocol without intravenous contrast. Multiplanar CT image reconstructions of the cervical spine were also generated. RADIATION DOSE REDUCTION: This exam was performed according to the departmental dose-optimization program which includes automated exposure control, adjustment of the mA and/or kV according to patient size and/or use of iterative reconstruction technique. COMPARISON:  07/16/2021 FINDINGS: CT HEAD FINDINGS Brain: No evidence of acute infarction, hemorrhage, hydrocephalus, extra-axial collection or mass lesion/mass effect. Vascular: No hyperdense vessel or unexpected calcification. Skull: Normal. Negative for fracture or focal lesion. Sinuses/Orbits: No acute finding. Other: None. CT CERVICAL SPINE FINDINGS Alignment: Normal. Skull base and vertebrae: No acute fracture. No primary bone lesion or focal pathologic process. Soft tissues and spinal canal: No prevertebral fluid or swelling. No visible canal hematoma. Disc levels: Mild disc space height loss and osteophytosis at C5-C6, with otherwise preserved disc spaces. Upper chest: Negative. Other: None. IMPRESSION: 1. No acute intracranial pathology. 2. No fracture or static subluxation of the cervical spine. Electronically Signed   By: Jearld Lesch M.D.   On: 10/23/2021 11:29    Procedures Procedures    Medications Ordered in ED Medications - No data to display  ED Course/ Medical Decision Making/ A&P                           Medical Decision Making Amount and/or Complexity of Data Reviewed Labs: ordered. Radiology: ordered.   56 year old female presents the emergency department for evaluation of 2 syncopal episodes.  Differential diagnosis includes limited to cardiogenic, vasovagal syncope, electrolyte abnormality,  dehydration, CHF exacerbation, cardiac syncope, psych, ACS, arrhythmia, PE, pneumonia, pneumothorax.  Vital signs show slightly decrease in  temp otherwise unremarkable.  Physical exam as noted above.  Benign neurological exam.  No signs of trauma.  Given patient's having syncopal episodes, will order CT head and C-spine as well as chest x-ray and labs.  On prior chart review, EMS note from yesterday states: "M221 AOSTF to find an unresponsive 56 y/o female pt seated on couch. Pt was noted to have very minor twitching like movements with intermittent frequency. HPFD reported vital signs within normal limits. Pt was assessed and noted to be resisting manual opening of her eyes for pupillary assessment. Pt was lifted and seated onto stair chair and was secured via straps. Pt was transferred through residence and was noted to be able to keep her feet on the foot rest of stair chair and support her head. Pt was transferred down stairs with same noted and was unsecured from stair chair and was lifted and placed on stretcher in semi-fowler position of comfort and secured. Pt was transferred to, loaded and secured in unit. ALS care was initiated with 12 lead ECG obtained and noted to be unremarkable, Pt was asked to hold her arm still while IV access was obtained with pt obliging while continuing to twitch with the remainder of her body. Following IV access the pt was given a normal saline flush on 10 ccs while it was verbalized that medication was being administered. Approximately 30-45 seconds following administration pt was noted to regain consciousness with no signs of cognitive deficit. Pt was noted to have a second instance of same with repeated 10cc normal saline administration with same results.[name] Upon arrival at hospital pt was unsecured and unloaded from unit and was transferred into ED and to triage area. Pt verbal report and care turned over to hospital nursing staff personnel. W119 clear."  Patient  recently had an echo done at [facility] with normal EF.  I independently reviewed and interpreted the patient's labs.  CMP shows slight decrease in AST at 13 otherwise unremarkable for her electrolytes or LFTs.  CBC without leukocytosis or anemia.  COVID and flu negative.  Ethanol not on detectable.  BNP at 11.  CBG at 90.  Opponent at 3 with repeat at less than 2.  UDS positive for cocaine and THC.  Urinalysis shows hazy concentrated urine otherwise normal.  Patient adamantly denies any drug use to me, however cocaine could be leading up to her syncopal episodes.  Will consult cardiology.  Chest x-ray shows no acute cardiopulmonary process.  CT of the head and neck shows no acute intracranial pathology.  No fracture or subluxation of the cervical spine.  Consult was placed to cardiology.  My attending spoke with Dr. Cristal Deer with cardiology.  They feel the patient would benefit most from outpatient cardiac monitoring for the next 14 to 30 days.  Recommended following up with her outpatient cardiologist.  My attending assessed at bedside and agrees to the plan.  Recommends discharge home with follow-up with outpatient cardiology.  We will give strict return precautions and red flag symptoms.  Patient is ambulatory with normal gait no acute neurodeficit.  Unsure of what is causing her syncopal episodes.  Cardiac versus psych.  I discussed the patient's lab and imaging with her recommended that she stop using cocaine and marijuana.  Discussed that she will need to follow-up with her cardiologist sometime this week for further cardiac monitoring.  I discussed strict return precautions and red flag symptoms.  Patient verbalized her understanding and agrees to the plan.  Patient is stable and being discharged home in good condition.  I discussed this case with my attending physician who cosigned this note including patient's presenting symptoms, physical exam, and planned  diagnostics and interventions. Attending physician stated agreement with plan or made changes to plan which were implemented.   Attending physician assessed patient at bedside.   Final Clinical Impression(s) / ED Diagnoses Final diagnoses:  Syncope, unspecified syncope type  Polysubstance abuse [facility])    Rx / DC Orders ED Discharge Orders     None         Achille Rich, PA-C 10/24/21 1542    Ernie Avena, MD 10/24/21 1753

## 2021-10-23 NOTE — Discharge Instructions (Addendum)
You were seen in the ER for evaluation of your syncopal episodes. Your labs and imaging are unremarkable. Please follow up with you cardiologist for re-evaluation and further monitoring this week. If you have any chest pain, shortness of breath, numbness, tingling, weakness, please return to the ER for evaluation.  Contact a doctor if: You have episodes of near fainting. Get help right away if: You pass out or faint. You hit your head or are injured after fainting. You have any of these symptoms: Fast or uneven heartbeats (palpitations). Pain in your chest, belly, or back. Shortness of breath. You have jerky movements that you cannot control (seizure). You have a very bad headache. You are confused. You have problems with how you see (vision). You are very weak. You have trouble walking. You are bleeding from your mouth or your butt (rectum). You have black or tarry poop (stool). These symptoms may be an emergency. Get help right away. Call your local emergency services (911 in the [place] Do not wait to see if the symptoms will go away. Do not drive yourself to the hospital.

## 2022-02-26 DIAGNOSIS — M329 Systemic lupus erythematosus, unspecified: Secondary | ICD-10-CM

## 2022-02-26 HISTORY — DX: Systemic lupus erythematosus, unspecified: M32.9

## 2022-06-29 ENCOUNTER — Emergency Department (HOSPITAL_BASED_OUTPATIENT_CLINIC_OR_DEPARTMENT_OTHER)
Admission: EM | Admit: 2022-06-29 | Discharge: 2022-06-29 | Disposition: A | Discharge status: Home / Self Care | Payer: 59 | Attending: Emergency Medicine | Admitting: Emergency Medicine

## 2022-06-29 ENCOUNTER — Other Ambulatory Visit: Payer: Self-pay

## 2022-06-29 ENCOUNTER — Other Ambulatory Visit (HOSPITAL_BASED_OUTPATIENT_CLINIC_OR_DEPARTMENT_OTHER): Payer: Self-pay

## 2022-06-29 ENCOUNTER — Encounter (HOSPITAL_BASED_OUTPATIENT_CLINIC_OR_DEPARTMENT_OTHER): Payer: Self-pay

## 2022-06-29 DIAGNOSIS — E876 Hypokalemia: Secondary | ICD-10-CM | POA: Insufficient documentation

## 2022-06-29 DIAGNOSIS — Z79899 Other long term (current) drug therapy: Secondary | ICD-10-CM | POA: Insufficient documentation

## 2022-06-29 DIAGNOSIS — I110 Hypertensive heart disease with heart failure: Secondary | ICD-10-CM | POA: Insufficient documentation

## 2022-06-29 DIAGNOSIS — Z91040 Latex allergy status: Secondary | ICD-10-CM | POA: Diagnosis not present

## 2022-06-29 DIAGNOSIS — R42 Dizziness and giddiness: Secondary | ICD-10-CM | POA: Insufficient documentation

## 2022-06-29 DIAGNOSIS — I509 Heart failure, unspecified: Secondary | ICD-10-CM | POA: Diagnosis not present

## 2022-06-29 DIAGNOSIS — Z7982 Long term (current) use of aspirin: Secondary | ICD-10-CM | POA: Insufficient documentation

## 2022-06-29 LAB — COMPREHENSIVE METABOLIC PANEL
ALT: 14 U/L (ref 0–44)
AST: 19 U/L (ref 15–41)
Albumin: 3.4 g/dL — ABNORMAL LOW (ref 3.5–5.0)
Alkaline Phosphatase: 90 U/L (ref 38–126)
Anion gap: 9 (ref 5–15)
BUN: 15 mg/dL (ref 6–20)
CO2: 31 mmol/L (ref 22–32)
Calcium: 8.4 mg/dL — ABNORMAL LOW (ref 8.9–10.3)
Chloride: 99 mmol/L (ref 98–111)
Creatinine, Ser: 0.73 mg/dL (ref 0.44–1.00)
GFR, Estimated: >60 mL/min (ref >60)
Glucose, Bld: 101 mg/dL — ABNORMAL HIGH (ref 70–99)
Potassium: 3 mmol/L — ABNORMAL LOW (ref 3.5–5.1)
Sodium: 139 mmol/L (ref 135–145)
Total Bilirubin: 0.5 mg/dL (ref 0.3–1.2)
Total Protein: 7.5 g/dL (ref 6.5–8.1)

## 2022-06-29 LAB — CBC WITH DIFFERENTIAL/PLATELET
Abs Immature Granulocytes: 0.01 10*3/uL (ref 0.00–0.07)
Basophils Absolute: 0 10*3/uL (ref 0.0–0.1)
Basophils Relative: 0 %
Eosinophils Absolute: 0 10*3/uL (ref 0.0–0.5)
Eosinophils Relative: 1 %
HCT: 39 % (ref 36.0–46.0)
Hemoglobin: 12.3 g/dL (ref 12.0–15.0)
Immature Granulocytes: 0 %
Lymphocytes Relative: 38 %
Lymphs Abs: 2.1 10*3/uL (ref 0.7–4.0)
MCH: 28.5 pg (ref 26.0–34.0)
MCHC: 31.5 g/dL (ref 30.0–36.0)
MCV: 90.5 fL (ref 80.0–100.0)
Monocytes Absolute: 0.7 10*3/uL (ref 0.1–1.0)
Monocytes Relative: 12 %
Neutro Abs: 2.7 10*3/uL (ref 1.7–7.7)
Neutrophils Relative %: 49 %
Platelets: 221 10*3/uL (ref 150–400)
RBC: 4.31 MIL/uL (ref 3.87–5.11)
RDW: 14.6 % (ref 11.5–15.5)
WBC: 5.4 10*3/uL (ref 4.0–10.5)
nRBC: 0 % (ref 0.0–0.2)

## 2022-06-29 LAB — MAGNESIUM: Magnesium: 1.9 mg/dL (ref 1.7–2.4)

## 2022-06-29 MED ORDER — POTASSIUM CHLORIDE CRYS ER 20 MEQ PO TBCR
40.0000 meq | EXTENDED_RELEASE_TABLET | Freq: Once | ORAL | Status: AC
  Administered 2022-06-29: 40 meq via ORAL
  Filled 2022-06-29: qty 2

## 2022-06-29 MED ORDER — POTASSIUM CHLORIDE CRYS ER 20 MEQ PO TBCR
20.0000 meq | EXTENDED_RELEASE_TABLET | Freq: Two times a day (BID) | ORAL | 0 refills | Status: DC
  Filled 2022-06-29: qty 6, 3d supply, fill #0

## 2022-06-29 MED ORDER — SODIUM CHLORIDE 0.9 % IV BOLUS
250.0000 mL | Freq: Once | INTRAVENOUS | Status: AC
  Administered 2022-06-29: 250 mL via INTRAVENOUS

## 2022-06-29 MED ORDER — POTASSIUM CHLORIDE 10 MEQ/100ML IV SOLN
10.0000 meq | Freq: Once | INTRAVENOUS | Status: AC
  Administered 2022-06-29: 10 meq via INTRAVENOUS
  Filled 2022-06-29: qty 100

## 2022-06-29 MED ORDER — SODIUM CHLORIDE 0.9 % IV SOLN: Freq: Once | INTRAVENOUS | Status: AC

## 2022-06-29 MED ORDER — MAGNESIUM SULFATE 2 GM/50ML IV SOLN
2.0000 g | Freq: Once | INTRAVENOUS | Status: AC
  Administered 2022-06-29: 2 g via INTRAVENOUS
  Filled 2022-06-29: qty 50

## 2022-06-29 NOTE — ED Provider Notes (Signed)
[place] [facility]Y DEPARTMENT AT [facility] Provider Note   CSN: [id number] Arrival date & time: 06/29/22  7829     History  Chief Complaint  Patient presents with   Dizziness    Melinda Woods is a 57 y.o. female.  Patient was sent for recheck of her electrolytes that she was seen by primary care doctor yesterday.  Her potassium was 3 and magnesium 1.7.  She is not having any chest pain or shortness of breath.  She is having some lightheaded episodes that have resolved.  Denies any nausea or vomiting.  She has heart failure, fibromyalgia, anxiety, hypertension.  She denies any active symptoms at this time.  She was told to come have electrolytes checked.  The history is provided by the patient.       Home Medications Prior to Admission medications   Medication Sig Start Date End Date Taking? Authorizing Provider  potassium chloride SA (KLOR-CON M) 20 MEQ tablet Take 1 tablet (20 mEq total) by mouth 2 (two) times daily for 3 days. 06/29/22 07/02/22 Yes Peityn Payton, DO  acetaminophen (TYLENOL) 650 MG CR tablet Take 1,300 mg by mouth daily as needed for pain.    [provider]  albuterol (ACCUNEB) 0.63 MG/3ML nebulizer solution Inhale into the lungs. 03/24/19   [provider]  albuterol (VENTOLIN HFA) 108 (90 Base) MCG/ACT inhaler Inhale 2 puffs into the lungs every 4 (four) hours as needed for wheezing or shortness of breath. 10/19/18   Derwood Kaplan, MD  amitriptyline (ELAVIL) 100 MG tablet Take 1 tablet (100 mg total) by mouth 2 (two) times daily. 04/26/16   Calvert Cantor, MD  aspirin EC 81 MG tablet Take 1 tablet (81 mg total) by mouth daily. 08/26/17   Delano Metz, MD  atorvastatin (LIPITOR) 40 MG tablet Take 1 tablet by mouth daily. 03/02/19   [provider]  baclofen (LIORESAL) 10 MG tablet Take 1 tablet by mouth at bedtime as needed. 10/26/19   [provider]  budesonide-formoterol (SYMBICORT) 160-4.5 MCG/ACT inhaler Inhale  into the lungs. 11/30/19   [provider]  celecoxib (CELEBREX) 100 MG capsule Take 1 capsule by mouth 2 (two) times daily. 12/10/19   [provider]  DULoxetine (CYMBALTA) 60 MG capsule Take 60 mg by mouth daily. 08/16/17   [provider]  furosemide (LASIX) 40 MG tablet Take 1 tablet (40 mg total) by mouth daily. 08/27/17   Delano Metz, MD  furosemide (LASIX) 40 MG tablet Take 1 tablet by mouth daily. 11/03/19   [provider]  gabapentin (NEURONTIN) 400 MG capsule TAKE 3 CAPSULES (1200 MG TOTAL) BY MOUTH 3 TIMES DAILY. 10/26/19   [provider]  HYDROcodone-acetaminophen (NORCO/VICODIN) 5-325 MG tablet Take 1 tablet by mouth every 6 (six) hours as needed for moderate pain.    [provider]  hydrOXYzine (ATARAX/VISTARIL) 25 MG tablet Take 2 tablets (50 mg total) by mouth every 6 (six) hours as needed for anxiety. 02/22/17   Alvira Monday, MD  ipratropium-albuterol (DUONEB) 0.5-2.5 (3) MG/3ML SOLN Take 3 mLs by nebulization 2 (two) times daily. Patient taking differently: Take 3 mLs by nebulization 2 (two) times daily as needed (shortness of breath/wheezing).  02/02/17   Kathlen Mody, MD  loratadine (CLARITIN) 10 MG tablet Take 10 mg by mouth daily. 09/01/18   [provider]  losartan (COZAAR) 25 MG tablet Take 1 tablet (25 mg total) by mouth daily. 04/26/16   Calvert Cantor, MD  metoprolol tartrate (LOPRESSOR) 25  MG tablet Take 1 tablet (25 mg total) by mouth 2 (two) times daily. 04/26/16   Calvert Cantor, MD  metoprolol tartrate (LOPRESSOR) 25 MG tablet Take 1 tablet by mouth 2 (two) times daily. 10/23/19   [provider]  montelukast (SINGULAIR) 10 MG tablet Take 1 tablet (10 mg total) by mouth daily. 08/26/17 11/26/18  Delano Metz, MD  montelukast (SINGULAIR) 10 MG tablet Take 1 tablet by mouth daily. 12/07/19   [provider]  polyethylene glycol (MIRALAX / GLYCOLAX) packet Take 17 g by mouth daily as needed for  mild constipation. 04/26/16   Calvert Cantor, MD  predniSONE (STERAPRED UNI-PAK 21 TAB) 10 MG (21) TBPK tablet Take by mouth daily. Take 6 tabs by mouth daily  for 2 days, then 5 tabs for 2 days, then 4 tabs for 2 days, then 3 tabs for 2 days, 2 tabs for 2 days, then 1 tab by mouth daily for 2 days 12/28/19   Caccavale, Sophia, PA-C  pregabalin (LYRICA) 200 MG capsule Take 200 mg by mouth 3 (three) times daily. 11/11/18   [provider]  pregabalin (LYRICA) 50 MG capsule Take by mouth. 10/26/19   [provider]  rOPINIRole (REQUIP) 0.5 MG tablet Take 0.5 mg by mouth at bedtime. 11/11/18   [provider]  topiramate (TOPAMAX) 50 MG tablet Take by mouth. 10/26/19 01/24/20  [provider]      Allergies    Latex    Review of Systems   Review of Systems  Physical Exam Updated Vital Signs BP 123/73   Pulse 95   Temp 98.2 F (36.8 C)   Resp 16   Ht 5\' 6"  (1.676 m)   Wt 108.9 kg   LMP 03/11/2021   SpO2 95%   BMI 38.74 kg/m  Physical Exam Vitals and nursing note reviewed.  Constitutional:      General: She is not in acute distress.    Appearance: She is well-developed. She is not ill-appearing.  HENT:     Head: Normocephalic and atraumatic.     Nose: Nose normal.     Mouth/Throat:     Mouth: Mucous membranes are moist.  Eyes:     Extraocular Movements: Extraocular movements intact.     Conjunctiva/sclera: Conjunctivae normal.     Pupils: Pupils are equal, round, and reactive to light.  Cardiovascular:     Rate and Rhythm: Normal rate and regular rhythm.     Pulses: Normal pulses.     Heart sounds: Normal heart sounds. No murmur heard. Pulmonary:     Effort: Pulmonary effort is normal. No respiratory distress.     Breath sounds: Normal breath sounds.  Abdominal:     General: Abdomen is flat.     Palpations: Abdomen is soft.     Tenderness: There is no abdominal tenderness.  Musculoskeletal:        General: No swelling.     Cervical back:  Normal range of motion and neck supple.  Skin:    General: Skin is warm and dry.     Capillary Refill: Capillary refill takes less than 2 seconds.  Neurological:     General: No focal deficit present.     Mental Status: She is alert and oriented to person, place, and time.     Cranial Nerves: No cranial nerve deficit.     Sensory: No sensory deficit.     Motor: No weakness.     Coordination: Coordination normal.     Comments:  5+ out of 5 strength throughout, normal sensation, no drift, normal speech  Psychiatric:        Mood and Affect: Mood normal.     ED Results / Procedures / Treatments   Labs (all labs ordered are listed, but only abnormal results are displayed) Labs Reviewed  COMPREHENSIVE METABOLIC PANEL - Abnormal; Notable for the following components:      Result Value   Potassium 3.0 (*)    Glucose, Bld 101 (*)    Calcium 8.4 (*)    Albumin 3.4 (*)    All other components within normal limits  CBC WITH DIFFERENTIAL/PLATELET  MAGNESIUM    EKG EKG Interpretation  Date/Time:  Friday Jun 29 2022 07:26:13 EDT Ventricular Rate:  89 PR Interval:  137 QRS Duration: 82 QT Interval:  380 QTC Calculation: 463 R Axis:   52 Text Interpretation: Sinus rhythm Low voltage, precordial leads Confirmed by Lockie Mola, Josede Cicero ([id number]) on 06/29/2022 7:27:56 AM  Radiology No results found.  Procedures Procedures    Medications Ordered in ED Medications  magnesium sulfate IVPB 2 g 50 mL (2 g Intravenous New Bag/Given 06/29/22 0729)  potassium chloride 10 mEq in 100 mL IVPB (has no administration in time range)  sodium chloride 0.9 % bolus 250 mL (0 mLs Intravenous Stopped 06/29/22 0803)  potassium chloride SA (KLOR-CON M) CR tablet 40 mEq (40 mEq Oral Given 06/29/22 0737)    ED Course/ Medical Decision Making/ A&P                             Medical Decision Making Amount and/or Complexity of Data Reviewed Labs: ordered.  Risk Prescription drug management.   Malynda Lumbreras is  here with electrolyte abnormalities.  Normal vitals.  No fever.  History of hypertension, CHF, arthritis and fibromyalgia.  Overall she had abnormal labs yesterday and was sent for recheck.  Show potassium 3 and magnesium 1.7.  Her neuroexam is normal.  She is asymptomatic.  Suspect electrolyte abnormalities may be from medications or poor p.o. intake.  Will give her small IV fluid bolus and give her a dose of IV magnesium and oral potassium and recheck her blood work.  Potassium 3.0.  Reassuring.  IV and p.o. potassium given.  Will prescribe p.o. potassium.  She was given IV magnesium and IV fluids.  Magnesium slightly improved today.  Lab work otherwise unremarkable.  Dicharged in good condition.  This chart was dictated using voice recognition software.  Despite best efforts to proofread,  errors can occur which can change the documentation meaning.         Final Clinical Impression(s) / ED Diagnoses Final diagnoses:  Hypokalemia    Rx / DC Orders ED Discharge Orders          Ordered    potassium chloride SA (KLOR-CON M) 20 MEQ tablet  2 times daily        06/29/22 0804              Virgina Norfolk, DO 06/29/22 4098

## 2022-06-29 NOTE — ED Triage Notes (Signed)
Says she has been having dizziness since Sunday (5 days) and followed up with PCP yesterday having blood work done.   Was notified of abnormally low potassium (3.0) and magnesium (1.7).   Encouraged to ER due to symptoms.

## 2022-06-29 NOTE — Discharge Instructions (Addendum)
Take potassium as prescribed.

## 2022-07-21 ENCOUNTER — Encounter (HOSPITAL_BASED_OUTPATIENT_CLINIC_OR_DEPARTMENT_OTHER): Payer: Self-pay | Admitting: Emergency Medicine

## 2022-07-21 ENCOUNTER — Other Ambulatory Visit: Payer: Self-pay

## 2022-07-21 ENCOUNTER — Emergency Department (HOSPITAL_BASED_OUTPATIENT_CLINIC_OR_DEPARTMENT_OTHER)
Admission: EM | Admit: 2022-07-21 | Discharge: 2022-07-21 | Disposition: A | Discharge status: Home / Self Care | Payer: 59 | Attending: Emergency Medicine | Admitting: Emergency Medicine

## 2022-07-21 DIAGNOSIS — L0291 Cutaneous abscess, unspecified: Secondary | ICD-10-CM

## 2022-07-21 DIAGNOSIS — L0201 Cutaneous abscess of face: Secondary | ICD-10-CM | POA: Insufficient documentation

## 2022-07-21 DIAGNOSIS — Z7982 Long term (current) use of aspirin: Secondary | ICD-10-CM | POA: Insufficient documentation

## 2022-07-21 MED ORDER — AMOXICILLIN-POT CLAVULANATE 875-125 MG PO TABS: 1.0000 | ORAL_TABLET | Freq: Two times a day (BID) | ORAL | 0 refills | Status: AC

## 2022-07-21 MED ORDER — HYDROCODONE-ACETAMINOPHEN 5-325 MG PO TABS
1.0000 | ORAL_TABLET | Freq: Once | ORAL | Status: AC
  Administered 2022-07-21: 1 via ORAL
  Filled 2022-07-21: qty 1

## 2022-07-21 MED ORDER — ONDANSETRON 4 MG PO TBDP
8.0000 mg | ORAL_TABLET | Freq: Once | ORAL | Status: AC
  Administered 2022-07-21: 8 mg via ORAL
  Filled 2022-07-21: qty 2

## 2022-07-21 NOTE — ED Triage Notes (Signed)
Abscess to R side of face.

## 2022-07-21 NOTE — ED Provider Notes (Signed)
[place] EM[facility]PART[facility] AT [facility] Provider Note   CSN: [id number] Arrival date & time: 07/21/22  1118     History  Chief Complaint  Patient presents with   Abscess    Melinda Woods is a 57 y.o. female.  53 female presents today for concern of an abscess over the right side of her face on the cheek.  This has been ongoing for the past couple days.  Denies fever, extension to the eye, or other complaints at this time.  She has not had similar issues in the past.  The history is provided by the patient. No language interpreter was used.       Home Medications Prior to Admission medications   Medication Sig Start Date End Date Taking? Authorizing Provider  acetaminophen (TYLENOL) 650 MG CR tablet Take 1,300 mg by mouth daily as needed for pain.    [provider]  albuterol (ACCUNEB) 0.63 MG/3ML nebulizer solution Inhale into the lungs. 03/24/19   [provider]  albuterol (VENTOLIN HFA) 108 (90 Base) MCG/ACT inhaler Inhale 2 puffs into the lungs every 4 (four) hours as needed for wheezing or shortness of breath. 10/19/18   Derwood Kaplan, MD  amitriptyline (ELAVIL) 100 MG tablet Take 1 tablet (100 mg total) by mouth 2 (two) times daily. 04/26/16   Calvert Cantor, MD  aspirin EC 81 MG tablet Take 1 tablet (81 mg total) by mouth daily. 08/26/17   Delano Metz, MD  atorvastatin (LIPITOR) 40 MG tablet Take 1 tablet by mouth daily. 03/02/19   [provider]  baclofen (LIORESAL) 10 MG tablet Take 1 tablet by mouth at bedtime as needed. 10/26/19   [provider]  budesonide-formoterol (SYMBICORT) 160-4.5 MCG/ACT inhaler Inhale into the lungs. 11/30/19   [provider]  celecoxib (CELEBREX) 100 MG capsule Take 1 capsule by mouth 2 (two) times daily. 12/10/19   [provider]  DULoxetine (CYMBALTA) 60 MG capsule Take 60 mg by mouth daily. 08/16/17   [provider]  furosemide (LASIX) 40 MG tablet Take 1  tablet (40 mg total) by mouth daily. 08/27/17   Delano Metz, MD  furosemide (LASIX) 40 MG tablet Take 1 tablet by mouth daily. 11/03/19   [provider]  gabapentin (NEURONTIN) 400 MG capsule TAKE 3 CAPSULES (1200 MG TOTAL) BY MOUTH 3 TIMES DAILY. 10/26/19   [provider]  HYDROcodone-acetaminophen (NORCO/VICODIN) 5-325 MG tablet Take 1 tablet by mouth every 6 (six) hours as needed for moderate pain.    [provider]  hydrOXYzine (ATARAX/VISTARIL) 25 MG tablet Take 2 tablets (50 mg total) by mouth every 6 (six) hours as needed for anxiety. 02/22/17   Alvira Monday, MD  ipratropium-albuterol (DUONEB) 0.5-2.5 (3) MG/3ML SOLN Take 3 mLs by nebulization 2 (two) times daily. Patient taking differently: Take 3 mLs by nebulization 2 (two) times daily as needed (shortness of breath/wheezing).  02/02/17   Kathlen Mody, MD  loratadine (CLARITIN) 10 MG tablet Take 10 mg by mouth daily. 09/01/18   [provider]  losartan (COZAAR) 25 MG tablet Take 1 tablet (25 mg total) by mouth daily. 04/26/16   Calvert Cantor, MD  metoprolol tartrate (LOPRESSOR) 25 MG tablet Take 1 tablet (25 mg total) by mouth 2 (two) times daily. 04/26/16   Calvert Cantor, MD  metoprolol tartrate (LOPRESSOR) 25 MG tablet Take 1 tablet by mouth 2 (two) times daily. 10/23/19   [provider]  montelukast (SINGULAIR) 10 MG tablet Take 1 tablet (10  mg total) by mouth daily. 08/26/17 11/26/18  Delano Metz, MD  montelukast (SINGULAIR) 10 MG tablet Take 1 tablet by mouth daily. 12/07/19   [provider]  polyethylene glycol (MIRALAX / GLYCOLAX) packet Take 17 g by mouth daily as needed for mild constipation. 04/26/16   Calvert Cantor, MD  potassium chloride SA (KLOR-CON M) 20 MEQ tablet Take 1 tablet (20 mEq total) by mouth 2 (two) times daily for 3 days. 06/29/22 07/02/22  Curatolo, Adam, DO  predniSONE (STERAPRED UNI-PAK 21 TAB) 10 MG (21) TBPK tablet Take by mouth daily. Take 6 tabs by mouth daily   for 2 days, then 5 tabs for 2 days, then 4 tabs for 2 days, then 3 tabs for 2 days, 2 tabs for 2 days, then 1 tab by mouth daily for 2 days 12/28/19   Caccavale, Sophia, PA-C  pregabalin (LYRICA) 200 MG capsule Take 200 mg by mouth 3 (three) times daily. 11/11/18   [provider]  pregabalin (LYRICA) 50 MG capsule Take by mouth. 10/26/19   [provider]  rOPINIRole (REQUIP) 0.5 MG tablet Take 0.5 mg by mouth at bedtime. 11/11/18   [provider]  topiramate (TOPAMAX) 50 MG tablet Take by mouth. 10/26/19 01/24/20  [provider]      Allergies    Latex    Review of Systems   Review of Systems  Physical Exam Updated Vital Signs BP (!) 141/78   Pulse (!) 104   Temp 99.4 F (37.4 C) (Oral)   Resp 18   Ht 5\' 6"  (1.676 m)   Wt 115.7 kg   LMP 03/11/2021   SpO2 93%   BMI 41.16 kg/m  Physical Exam Vitals and nursing note reviewed.  Constitutional:      General: She is not in acute distress.    Appearance: Normal appearance. She is not ill-appearing.  HENT:     Head: Normocephalic and atraumatic.      Nose: Nose normal.  Eyes:     Conjunctiva/sclera: Conjunctivae normal.  Cardiovascular:     Rate and Rhythm: Normal rate.     Comments: Initially noted to be tachycardic.  Improved to rate of 80s during my evaluation. Pulmonary:     Effort: Pulmonary effort is normal. No respiratory distress.  Musculoskeletal:        General: No deformity.  Skin:    Findings: No rash.  Neurological:     Mental Status: She is alert.     ED Results / Procedures / Treatments   Labs (all labs ordered are listed, but only abnormal results are displayed) Labs Reviewed - No data to display  EKG None  Radiology No results found.  Procedures Procedures    Medications Ordered in ED Medications  ondansetron (ZOFRAN-ODT) disintegrating tablet 8 mg (has no administration in time range)  HYDROcodone-acetaminophen (NORCO/VICODIN) 5-325 MG per tablet 1  tablet (has no administration in time range)    ED Course/ Medical Decision Making/ A&P                             Medical Decision Making  57 year old female presents today for concern of abscess.  It is on the right side of the face.  No drainage.  It is tender to palpation.  We discussed incision and drainage versus conservative management with antibiotics and warm compresses initially.  Following our discussion she prefers to start with antibiotics and warm compresses send she will  return if she has any worsening symptoms otherwise she will follow-up with her PCP in the next 2 to 3 days.  Feel this is reasonable.  Patient is in agreement with plan.  She is stable for discharge.  No involvement of the eye.   Final Clinical Impression(s) / ED Diagnoses Final diagnoses:  Abscess    Rx / DC Orders ED Discharge Orders          Ordered    amoxicillin-clavulanate (AUGMENTIN) 875-125 MG tablet  Every 12 hours        07/21/22 1200              Marita Kansas, PA-C 07/21/22 1201    Alvira Monday, MD 07/21/22 1654

## 2022-07-21 NOTE — Discharge Instructions (Addendum)
Your workup today is overall reassuring.  You do have an abscess.  We discussed draining versus trialing antibiotics and warm compresses initially.  He would prefer antibiotics and warm compresses for now.  If you have any worsening in signs or symptoms return to the emergency department otherwise follow-up with the primary care doctor in 2 to 3 days.

## 2022-09-07 ENCOUNTER — Emergency Department (HOSPITAL_COMMUNITY): Payer: 59

## 2022-09-07 ENCOUNTER — Other Ambulatory Visit: Payer: Self-pay

## 2022-09-07 ENCOUNTER — Emergency Department (HOSPITAL_BASED_OUTPATIENT_CLINIC_OR_DEPARTMENT_OTHER)
Admission: EM | Admit: 2022-09-07 | Discharge: 2022-09-07 | Disposition: A | Discharge status: Home / Self Care | Payer: 59 | Attending: Emergency Medicine | Admitting: Emergency Medicine

## 2022-09-07 ENCOUNTER — Encounter (HOSPITAL_BASED_OUTPATIENT_CLINIC_OR_DEPARTMENT_OTHER): Payer: Self-pay

## 2022-09-07 DIAGNOSIS — M545 Low back pain, unspecified: Secondary | ICD-10-CM | POA: Diagnosis present

## 2022-09-07 DIAGNOSIS — I110 Hypertensive heart disease with heart failure: Secondary | ICD-10-CM | POA: Diagnosis not present

## 2022-09-07 DIAGNOSIS — M5442 Lumbago with sciatica, left side: Secondary | ICD-10-CM | POA: Insufficient documentation

## 2022-09-07 DIAGNOSIS — Z79899 Other long term (current) drug therapy: Secondary | ICD-10-CM | POA: Insufficient documentation

## 2022-09-07 DIAGNOSIS — I509 Heart failure, unspecified: Secondary | ICD-10-CM | POA: Insufficient documentation

## 2022-09-07 DIAGNOSIS — Z91040 Latex allergy status: Secondary | ICD-10-CM | POA: Diagnosis not present

## 2022-09-07 LAB — CBC WITH DIFFERENTIAL/PLATELET
Abs Immature Granulocytes: 0.06 10*3/uL (ref 0.00–0.07)
Basophils Absolute: 0 10*3/uL (ref 0.0–0.1)
Basophils Relative: 0 %
Eosinophils Absolute: 0 10*3/uL (ref 0.0–0.5)
Eosinophils Relative: 0 %
HCT: 38.2 % (ref 36.0–46.0)
Hemoglobin: 12 g/dL (ref 12.0–15.0)
Immature Granulocytes: 1 %
Lymphocytes Relative: 6 %
Lymphs Abs: 0.8 10*3/uL (ref 0.7–4.0)
MCH: 28.7 pg (ref 26.0–34.0)
MCHC: 31.4 g/dL (ref 30.0–36.0)
MCV: 91.4 fL (ref 80.0–100.0)
Monocytes Absolute: 0.3 10*3/uL (ref 0.1–1.0)
Monocytes Relative: 2 %
Neutro Abs: 11.6 10*3/uL — ABNORMAL HIGH (ref 1.7–7.7)
Neutrophils Relative %: 91 %
Platelets: 235 10*3/uL (ref 150–400)
RBC: 4.18 MIL/uL (ref 3.87–5.11)
RDW: 15.5 % (ref 11.5–15.5)
WBC: 12.8 10*3/uL — ABNORMAL HIGH (ref 4.0–10.5)
nRBC: 0 % (ref 0.0–0.2)

## 2022-09-07 LAB — BASIC METABOLIC PANEL
Anion gap: 13 (ref 5–15)
BUN: 10 mg/dL (ref 6–20)
CO2: 30 mmol/L (ref 22–32)
Calcium: 8.6 mg/dL — ABNORMAL LOW (ref 8.9–10.3)
Chloride: 94 mmol/L — ABNORMAL LOW (ref 98–111)
Creatinine, Ser: 0.75 mg/dL (ref 0.44–1.00)
GFR, Estimated: >60 mL/min (ref >60)
Glucose, Bld: 173 mg/dL — ABNORMAL HIGH (ref 70–99)
Potassium: 3.2 mmol/L — ABNORMAL LOW (ref 3.5–5.1)
Sodium: 137 mmol/L (ref 135–145)

## 2022-09-07 MED ORDER — METHOCARBAMOL 500 MG PO TABS: 500.0000 mg | ORAL_TABLET | Freq: Two times a day (BID) | ORAL | 0 refills | Status: AC

## 2022-09-07 MED ORDER — KETOROLAC TROMETHAMINE 15 MG/ML IJ SOLN
15.0000 mg | Freq: Once | INTRAMUSCULAR | Status: AC
  Administered 2022-09-07: 15 mg via INTRAVENOUS
  Filled 2022-09-07: qty 1

## 2022-09-07 MED ORDER — HYDROMORPHONE HCL 1 MG/ML IJ SOLN
1.0000 mg | Freq: Once | INTRAMUSCULAR | Status: AC
  Administered 2022-09-07: 1 mg via INTRAMUSCULAR

## 2022-09-07 MED ORDER — NAPROXEN 500 MG PO TABS: 500.0000 mg | ORAL_TABLET | Freq: Two times a day (BID) | ORAL | 0 refills | Status: AC

## 2022-09-07 MED ORDER — DEXAMETHASONE SODIUM PHOSPHATE 10 MG/ML IJ SOLN
10.0000 mg | Freq: Once | INTRAMUSCULAR | Status: DC
  Filled 2022-09-07: qty 1

## 2022-09-07 MED ORDER — GADOBUTROL 1 MMOL/ML IV SOLN: 10.0000 mL | Freq: Once | INTRAVENOUS | Status: AC | PRN

## 2022-09-07 MED ORDER — HYDROMORPHONE HCL 1 MG/ML IJ SOLN
1.0000 mg | Freq: Once | INTRAMUSCULAR | Status: AC
  Administered 2022-09-07: 1 mg via INTRAVENOUS
  Filled 2022-09-07: qty 1

## 2022-09-07 MED ORDER — LIDOCAINE 5 % EX PTCH
2.0000 | MEDICATED_PATCH | CUTANEOUS | Status: DC
  Administered 2022-09-07: 2 via TRANSDERMAL
  Filled 2022-09-07 (×2): qty 2

## 2022-09-07 MED ORDER — DEXAMETHASONE SODIUM PHOSPHATE 10 MG/ML IJ SOLN
10.0000 mg | Freq: Once | INTRAMUSCULAR | Status: AC
  Administered 2022-09-07: 10 mg via INTRAMUSCULAR

## 2022-09-07 MED ORDER — HYDROCODONE-ACETAMINOPHEN 5-325 MG PO TABS: 1.0000 | ORAL_TABLET | Freq: Four times a day (QID) | ORAL | 0 refills | Status: AC | PRN

## 2022-09-07 MED ORDER — HYDROMORPHONE HCL 1 MG/ML IJ SOLN
1.0000 mg | Freq: Once | INTRAMUSCULAR | Status: DC
  Filled 2022-09-07: qty 1

## 2022-09-07 MED ORDER — METHOCARBAMOL 500 MG PO TABS
500.0000 mg | ORAL_TABLET | Freq: Once | ORAL | Status: AC
  Administered 2022-09-07: 500 mg via ORAL
  Filled 2022-09-07: qty 1

## 2022-09-07 NOTE — ED Triage Notes (Addendum)
Pt to ED via [facility], transfer from [facility], here for MRI of back. No medications given during transfer

## 2022-09-07 NOTE — ED Notes (Signed)
Medicine given and pt instructed we planned to walk soon to see how she ambulated once medicated.

## 2022-09-07 NOTE — ED Notes (Addendum)
Ambulated patient approximately 57ft.Patient holding onto husband while walking, stopped and was unable to continue due to yelling in pain and not feeling stable enough on her feet. PA notified.

## 2022-09-07 NOTE — ED Notes (Signed)
Report given to Charge RN Ladona Ridgel at [facility] ED.

## 2022-09-07 NOTE — ED Provider Notes (Signed)
  [place] EMERGENCY DEPARTMENT AT [facility] Provider Note  MDM   HPI/ROS:  Melinda Woods is a 57 y.o. female with a medical history as below who presents for evaluation of low back pain.  She is a transfer from [facility] for MRI.  Denies any significant change since her evaluation there.  Physical exam is notable for: -***  On my initial evaluation, patient is:  -Vital signs stable.*** Patient afebrile***, hemodynamically stable***, and non-toxic appearing.*** -Additional history obtained from ***  This patient's current presentation, including their history and physical exam, is most consistent with ***. Differentials include ***.     Interpretations, interventions, and the patient's course of care are documented below.      ***   Disposition:  {ED Dispo:29898}  Clinical Impression:  1. Acute left-sided low back pain with left-sided sciatica     Rx / DC Orders ED Discharge Orders          Ordered    naproxen (NAPROSYN) 500 MG tablet  2 times daily        09/07/22 1641    methocarbamol (ROBAXIN) 500 MG tablet  2 times daily        09/07/22 1641    HYDROcodone-acetaminophen (NORCO/VICODIN) 5-325 MG tablet  Every 6 hours PRN        09/07/22 1649            The plan for this patient was discussed with Dr. Jearld Fenton, who voiced agreement and who oversaw evaluation and treatment of this patient.   Clinical Complexity A medically appropriate history, review of systems, and physical exam was performed.  My independent interpretations of EKG, labs, and radiology are documented in the ED course above.   If decision rules were used in this patient's evaluation, they are listed below.  *** Click here for ABCD2, HEART and other calculatorsREFRESH Note before signing   Patient's presentation is most consistent with {EM COPA:27473}  Medical Decision Making Amount and/or Complexity of Data Reviewed Labs: ordered. Radiology:  ordered.  Risk Prescription drug management.    HPI/ROS      See MDM section for pertinent HPI and ROS. A complete ROS was performed with pertinent positives/negatives noted above.   Past Medical History:  Diagnosis Date   Anxiety    Asthma    CHF (congestive heart failure) (HCC)    grade 1 dd on echo 03/2016   Fatty liver    Fibromyalgia    Hypertension    Neuropathy    Ovarian cyst    RA (rheumatoid arthritis) (HCC)     Past Surgical History:  Procedure Laterality Date   TUBAL LIGATION        Physical Exam   Vitals:   09/07/22 1430 09/07/22 1600 09/07/22 1707 09/07/22 1843  BP:   138/64 (!) 121/57  Pulse:   96 91  Resp:   18 16  Temp:   98 F (36.7 C) 98.6 F (37 C)  TempSrc:      SpO2: 98% 100% 100% 95%  Weight:      Height:        Physical Exam   Procedures   If procedures were preformed on this patient, they are listed below:  Procedures   Fayrene Helper, MD Emergency Medicine PGY-2   Please note that this documentation was produced with the assistance of voice-to-text technology and may contain errors.

## 2022-09-07 NOTE — ED Provider Notes (Signed)
EMERGENCY DEPARTMENT AT [facility] Provider Note   CSN: [id number] Arrival date & time: 09/07/22  1246     History  Chief Complaint  Patient presents with   Back Pain    Melinda Woods is a 57 y.o. female with a past medical history significant for hypertension, CHF, history of lumbar radiculopathy, and fibromyalgia who presents to the ED due to low back pain that radiates down left lower extremity that started last night.  Patient states she has chronic low back pain.  Sees pain management.  No fever or chills.  No history of IV drug use or cancer.  Denies saddle anesthesia and bowel/bladder incontinence.  Admits to some left lower extremity numbness/tingling and weakness which is baseline for patient.  No abdominal pain or urinary symptoms.  History obtained from patient and past medical records. No interpreter used during encounter.       Home Medications Prior to Admission medications   Medication Sig Start Date End Date Taking? Authorizing Provider  HYDROcodone-acetaminophen (NORCO/VICODIN) 5-325 MG tablet Take 1 tablet by mouth every 6 (six) hours as needed. 09/07/22  Yes Leahanna Buser, Merla Riches, PA-C  methocarbamol (ROBAXIN) 500 MG tablet Take 1 tablet (500 mg total) by mouth 2 (two) times daily. 09/07/22  Yes Afsa Meany C, PA-C  naproxen (NAPROSYN) 500 MG tablet Take 1 tablet (500 mg total) by mouth 2 (two) times daily. 09/07/22  Yes Mattia Liford, Merla Riches, PA-C  acetaminophen (TYLENOL) 650 MG CR tablet Take 1,300 mg by mouth daily as needed for pain.    [provider]  albuterol (ACCUNEB) 0.63 MG/3ML nebulizer solution Inhale into the lungs. 03/24/19   [provider]  albuterol (VENTOLIN HFA) 108 (90 Base) MCG/ACT inhaler Inhale 2 puffs into the lungs every 4 (four) hours as needed for wheezing or shortness of breath. 10/19/18   Derwood Kaplan, MD  amitriptyline (ELAVIL) 100 MG tablet Take 1 tablet (100 mg total) by mouth 2 (two) times  daily. 04/26/16   Calvert Cantor, MD  amoxicillin-clavulanate (AUGMENTIN) 875-125 MG tablet Take 1 tablet by mouth every 12 (twelve) hours. 07/21/22   Marita Kansas, PA-C  aspirin EC 81 MG tablet Take 1 tablet (81 mg total) by mouth daily. 08/26/17   Delano Metz, MD  atorvastatin (LIPITOR) 40 MG tablet Take 1 tablet by mouth daily. 03/02/19   [provider]  baclofen (LIORESAL) 10 MG tablet Take 1 tablet by mouth at bedtime as needed. 10/26/19   [provider]  budesonide-formoterol (SYMBICORT) 160-4.5 MCG/ACT inhaler Inhale into the lungs. 11/30/19   [provider]  celecoxib (CELEBREX) 100 MG capsule Take 1 capsule by mouth 2 (two) times daily. 12/10/19   [provider]  DULoxetine (CYMBALTA) 60 MG capsule Take 60 mg by mouth daily. 08/16/17   [provider]  furosemide (LASIX) 40 MG tablet Take 1 tablet (40 mg total) by mouth daily. 08/27/17   Delano Metz, MD  furosemide (LASIX) 40 MG tablet Take 1 tablet by mouth daily. 11/03/19   [provider]  gabapentin (NEURONTIN) 400 MG capsule TAKE 3 CAPSULES (1200 MG TOTAL) BY MOUTH 3 TIMES DAILY. 10/26/19   [provider]  HYDROcodone-acetaminophen (NORCO/VICODIN) 5-325 MG tablet Take 1 tablet by mouth every 6 (six) hours as needed for moderate pain.    [provider]  hydrOXYzine (ATARAX/VISTARIL) 25 MG tablet Take 2 tablets (50 mg total) by mouth every 6 (six) hours as needed for anxiety. 02/22/17   Alvira Monday, MD  ipratropium-albuterol (DUONEB) 0.5-2.5 (3) MG/3ML SOLN Take 3 mLs by nebulization 2 (two) times daily. Patient taking differently: Take 3 mLs by nebulization 2 (two) times daily as needed (shortness of breath/wheezing).  02/02/17   Kathlen Mody, MD  loratadine (CLARITIN) 10 MG tablet Take 10 mg by mouth daily. 09/01/18   [provider]  losartan (COZAAR) 25 MG tablet Take 1 tablet (25 mg total) by mouth daily. 04/26/16   Calvert Cantor, MD  metoprolol tartrate  (LOPRESSOR) 25 MG tablet Take 1 tablet (25 mg total) by mouth 2 (two) times daily. 04/26/16   Calvert Cantor, MD  metoprolol tartrate (LOPRESSOR) 25 MG tablet Take 1 tablet by mouth 2 (two) times daily. 10/23/19   [provider]  montelukast (SINGULAIR) 10 MG tablet Take 1 tablet (10 mg total) by mouth daily. 08/26/17 11/26/18  Delano Metz, MD  montelukast (SINGULAIR) 10 MG tablet Take 1 tablet by mouth daily. 12/07/19   [provider]  polyethylene glycol (MIRALAX / GLYCOLAX) packet Take 17 g by mouth daily as needed for mild constipation. 04/26/16   Calvert Cantor, MD  potassium chloride SA (KLOR-CON M) 20 MEQ tablet Take 1 tablet (20 mEq total) by mouth 2 (two) times daily for 3 days. 06/29/22 07/02/22  Curatolo, Adam, DO  predniSONE (STERAPRED UNI-PAK 21 TAB) 10 MG (21) TBPK tablet Take by mouth daily. Take 6 tabs by mouth daily  for 2 days, then 5 tabs for 2 days, then 4 tabs for 2 days, then 3 tabs for 2 days, 2 tabs for 2 days, then 1 tab by mouth daily for 2 days 12/28/19   Caccavale, Sophia, PA-C  pregabalin (LYRICA) 200 MG capsule Take 200 mg by mouth 3 (three) times daily. 11/11/18   [provider]  pregabalin (LYRICA) 50 MG capsule Take by mouth. 10/26/19   [provider]  rOPINIRole (REQUIP) 0.5 MG tablet Take 0.5 mg by mouth at bedtime. 11/11/18   [provider]  topiramate (TOPAMAX) 50 MG tablet Take by mouth. 10/26/19 01/24/20  [provider]      Allergies    Latex    Review of Systems   Review of Systems  Respiratory:  Negative for shortness of breath.   Cardiovascular:  Negative for chest pain.  Gastrointestinal:  Negative for abdominal pain.  Genitourinary:  Negative for dysuria.  Musculoskeletal:  Positive for back pain and gait problem.    Physical Exam Updated Vital Signs BP 138/64 (BP Location: Right Arm)   Pulse 96   Temp 98 F (36.7 C)   Resp 18   Ht 5\' 6"  (1.676 m)   Wt 115.7 kg   LMP 03/11/2021   SpO2 100%    BMI 41.16 kg/m  Physical Exam Vitals and nursing note reviewed.  Constitutional:      General: She is not in acute distress.    Appearance: She is not ill-appearing.  HENT:     Head: Normocephalic.  Eyes:     Pupils: Pupils are equal, round, and reactive to light.  Cardiovascular:     Rate and Rhythm: Normal rate and regular rhythm.     Pulses: Normal pulses.     Heart sounds: Normal heart sounds. No murmur heard.    No friction rub. No gallop.  Pulmonary:     Effort: Pulmonary effort is normal.     Breath sounds: Normal breath sounds.  Abdominal:     General: Abdomen is flat. There is no distension.     Palpations:  Abdomen is soft.     Tenderness: There is no abdominal tenderness. There is no guarding or rebound.  Musculoskeletal:        General: Normal range of motion.     Cervical back: Neck supple.     Comments: Reproducible left-sided low back tenderness.  5/5 strength of left lower extremity.  Pedal pulses palpable.  Skin:    General: Skin is warm and dry.  Neurological:     General: No focal deficit present.     Mental Status: She is alert.  Psychiatric:        Mood and Affect: Mood normal.        Behavior: Behavior normal.     ED Results / Procedures / Treatments   Labs (all labs ordered are listed, but only abnormal results are displayed) Labs Reviewed  CBC WITH DIFFERENTIAL/PLATELET  BASIC METABOLIC PANEL    EKG None  Radiology No results found.  Procedures Procedures    Medications Ordered in ED Medications  lidocaine (LIDODERM) 5 % 2 patch (2 patches Transdermal Patch Applied 09/07/22 1413)  methocarbamol (ROBAXIN) tablet 500 mg (500 mg Oral Given 09/07/22 1412)  HYDROmorphone (DILAUDID) injection 1 mg (1 mg Intramuscular Given 09/07/22 1429)  dexamethasone (DECADRON) injection 10 mg (10 mg Intramuscular Given 09/07/22 1429)  HYDROmorphone (DILAUDID) injection 1 mg (1 mg Intravenous Given 09/07/22 1523)    ED Course/ Medical Decision Making/  A&P                             Medical Decision Making Amount and/or Complexity of Data Reviewed Independent Historian: spouse Labs: ordered. Radiology: ordered.  Risk Prescription drug management.   This patient presents to the ED for concern of low back pain, this involves an extensive number of treatment options, and is a complaint that carries with it a high risk of complications and morbidity.  The differential diagnosis includes lumbar radiculopathy, cauda equina, discitis, osteomyelitis, etc  57 year old female presents to the ED due to left low back pain that radiates down left lower extremity.  History of lumbar radiculopathy.  Notes it feels similar.  No direct injury.  Denies saddle anesthesia and bowel/bladder continence.  No IV drug use or fever.  No history of cancer.  Upon arrival, stable vitals.  Triage noted patient be tachycardic at 103 however, during my initial evaluation patient's heart rate in the upper 80s.  Patient appears uncomfortable in bed.  Tenderness to palpation throughout left lumbar paraspinal region.  Bilateral lower extremities neurovascularly intact.  Low suspicion for cauda equina or central cord compression.  Given no direct injury, low suspicion for bony fracture.  No infectious symptoms to suggest infectious etiology.  Will treat with IM pain medication and steroids. And reassess.  3:09 PM reassessed patient.  Patient admits to some improvement in pain however, still experiencing significant pain.  Patient able to ambulate a few steps, but with significant pain. Will give another dose of Dilaudid and reassess.  Offered to transfer patient for MRI however, patient declined.  Lower suspicion for cauda equina or central cord compression given patient is moving lower extremities and able to ambulate.  4:38 PM reassessed patient at bedside.  Patient with significant improvement in pain after second dose of Dilaudid.  Bilateral lower extremities neurovascularly  intact.  Low suspicion for cauda equina or central cord compression.  Will ambulate patient 1 more time to ensure she can walk.  Patient unable to ambulate,  screaming in pain. Discussed with Dr. Doran Durand who accepts patient as ED to ED transfer for MRI lumbar spine. If unremarkable, patient may be discharge home with pain management and neurosurgery referral.   Lives at home Has PCP       Final Clinical Impression(s) / ED Diagnoses Final diagnoses:  Acute left-sided low back pain with left-sided sciatica    Rx / DC Orders ED Discharge Orders          Ordered    naproxen (NAPROSYN) 500 MG tablet  2 times daily        09/07/22 1641    methocarbamol (ROBAXIN) 500 MG tablet  2 times daily        09/07/22 1641    HYDROcodone-acetaminophen (NORCO/VICODIN) 5-325 MG tablet  Every 6 hours PRN        09/07/22 1649              Mannie Stabile, PA-C 09/07/22 1709    Pricilla Loveless, MD 09/10/22 1601

## 2022-09-07 NOTE — ED Notes (Signed)
Pt transported to MRI 

## 2022-09-07 NOTE — ED Triage Notes (Signed)
Patient having severe lower back pain since yesterday. Hx of sciatica and stated this feels the same.

## 2022-09-07 NOTE — Discharge Instructions (Addendum)
You are seen today for low back pain.  While you were here we performed a physical exam, monitor vital signs, checked labs at the [facility], and obtained an MRI of your lumbar spine here.  All of these were reassuring and showed possible muscle strain as well as a disc bulge at L5.  There were no indications for any need for emergent neurosurgery or spine surgery.  I provided information for both the neurosurgery and the pain management team, please plan to follow-up with them as soon as you are able.  Additionally, see your primary care provider within the next week.  Return to the emergency department if you have any new or concerning symptoms including inability to urinate or control your bowels.  If you have any numbness between your legs, or any significant weakness on any side of your body.  We do happy to reevaluate you.

## 2022-09-07 NOTE — ED Notes (Signed)
Pt in wheelchair. 

## 2022-09-07 NOTE — ED Notes (Signed)
Pt stood up and walked about 15 steps without assistance. Pt had a wheelchair nearby for safety, but was able to ambulated without incident. No new neurological deficits noted. Pt was able to rest in the bed prone without breakthrough pain. Pain was worse when bearing weight on L leg trying to shuffle R leg. Pt has history of sciatica and describes the pain as exactly the same. Distal PMS intact. Pt returned to bed.

## 2022-09-07 NOTE — ED Notes (Signed)
Pt failed to ambulate successfully down the hallway. Halfway down, she began to have a severe lower back spasm over her L lumbar region. She needed a wheelchair to prevent a fall, and was safely transferred to the chair then back to her bed. She is able to stand and pivot without incident, +1 assist for safety. Fall risk bundle implemented.

## 2023-01-01 ENCOUNTER — Encounter (HOSPITAL_BASED_OUTPATIENT_CLINIC_OR_DEPARTMENT_OTHER): Payer: Self-pay | Admitting: Emergency Medicine

## 2023-01-01 ENCOUNTER — Emergency Department (HOSPITAL_BASED_OUTPATIENT_CLINIC_OR_DEPARTMENT_OTHER)
Admission: EM | Admit: 2023-01-01 | Discharge: 2023-01-01 | Disposition: A | Discharge status: Home / Self Care | Payer: 59 | Attending: Emergency Medicine | Admitting: Emergency Medicine

## 2023-01-01 ENCOUNTER — Other Ambulatory Visit: Payer: Self-pay

## 2023-01-01 DIAGNOSIS — Z7982 Long term (current) use of aspirin: Secondary | ICD-10-CM | POA: Insufficient documentation

## 2023-01-01 DIAGNOSIS — Z91040 Latex allergy status: Secondary | ICD-10-CM | POA: Diagnosis not present

## 2023-01-01 DIAGNOSIS — E876 Hypokalemia: Secondary | ICD-10-CM | POA: Insufficient documentation

## 2023-01-01 LAB — MAGNESIUM: Magnesium: 1.6 mg/dL — ABNORMAL LOW (ref 1.7–2.4)

## 2023-01-01 LAB — POTASSIUM
Potassium: 2.6 mmol/L — CL (ref 3.5–5.1)
Potassium: 3.2 mmol/L — ABNORMAL LOW (ref 3.5–5.1)

## 2023-01-01 MED ORDER — POTASSIUM CHLORIDE CRYS ER 20 MEQ PO TBCR
40.0000 meq | EXTENDED_RELEASE_TABLET | Freq: Once | ORAL | Status: AC
  Administered 2023-01-01: 40 meq via ORAL
  Filled 2023-01-01: qty 2

## 2023-01-01 MED ORDER — POTASSIUM CHLORIDE 10 MEQ/100ML IV SOLN
10.0000 meq | INTRAVENOUS | Status: AC
  Administered 2023-01-01 (×2): 10 meq via INTRAVENOUS
  Filled 2023-01-01 (×2): qty 100

## 2023-01-01 MED ORDER — POTASSIUM CHLORIDE CRYS ER 20 MEQ PO TBCR: 20.0000 meq | EXTENDED_RELEASE_TABLET | Freq: Two times a day (BID) | ORAL | 0 refills | Status: AC

## 2023-01-01 MED ORDER — POTASSIUM CHLORIDE 10 MEQ/100ML IV SOLN
10.0000 meq | Freq: Once | INTRAVENOUS | Status: AC
Start: 2023-01-01 — End: 2023-01-01
  Administered 2023-01-01: 10 meq via INTRAVENOUS
  Filled 2023-01-01: qty 100

## 2023-01-01 MED ORDER — MAG-OXIDE 200 MG PO TABS: 200.0000 mg | ORAL_TABLET | Freq: Every day | ORAL | 0 refills | Status: AC

## 2023-01-01 MED ORDER — MAGNESIUM OXIDE -MG SUPPLEMENT 400 (240 MG) MG PO TABS
800.0000 mg | ORAL_TABLET | Freq: Once | ORAL | Status: AC
  Administered 2023-01-01: 800 mg via ORAL
  Filled 2023-01-01: qty 2

## 2023-01-01 NOTE — ED Provider Notes (Signed)
[place] EMERGENCY DEPARTMENT AT [facility] Provider Note   CSN: [id number] Arrival date & time: 01/01/23  9562     History  Chief Complaint  Patient presents with   Abnormal Lab    Melinda Woods is a 57 y.o. female.  57 yo F with a chief complaints of her potassium being low.  She was seen by her family doctor yesterday and had lab work drawn.  Was called today and told that her potassium was low and to come to the emergency department for replenishment.  She unfortunately has had a lot of issues recently.  None of those are drastically changed over the past week or 2.  She does take Lasix and has been having some diarrhea she thinks secondary to her antibiotic for her acne.   Abnormal Lab      Home Medications Prior to Admission medications   Medication Sig Start Date End Date Taking? Authorizing Provider  acetaminophen (TYLENOL) 650 MG CR tablet Take 1,300 mg by mouth daily as needed for pain.    [provider]  albuterol (ACCUNEB) 0.63 MG/3ML nebulizer solution Inhale into the lungs. 03/24/19   [provider]  albuterol (VENTOLIN HFA) 108 (90 Base) MCG/ACT inhaler Inhale 2 puffs into the lungs every 4 (four) hours as needed for wheezing or shortness of breath. 10/19/18   Derwood Kaplan, MD  amitriptyline (ELAVIL) 100 MG tablet Take 1 tablet (100 mg total) by mouth 2 (two) times daily. 04/26/16   Calvert Cantor, MD  amoxicillin-clavulanate (AUGMENTIN) 875-125 MG tablet Take 1 tablet by mouth every 12 (twelve) hours. 07/21/22   Marita Kansas, PA-C  aspirin EC 81 MG tablet Take 1 tablet (81 mg total) by mouth daily. 08/26/17   Delano Metz, MD  atorvastatin (LIPITOR) 40 MG tablet Take 1 tablet by mouth daily. 03/02/19   [provider]  baclofen (LIORESAL) 10 MG tablet Take 1 tablet by mouth at bedtime as needed. 10/26/19   [provider]  budesonide-formoterol (SYMBICORT) 160-4.5 MCG/ACT inhaler Inhale into the lungs. 11/30/19    [provider]  celecoxib (CELEBREX) 100 MG capsule Take 1 capsule by mouth 2 (two) times daily. 12/10/19   [provider]  DULoxetine (CYMBALTA) 60 MG capsule Take 60 mg by mouth daily. 08/16/17   [provider]  furosemide (LASIX) 40 MG tablet Take 1 tablet (40 mg total) by mouth daily. 08/27/17   Delano Metz, MD  furosemide (LASIX) 40 MG tablet Take 1 tablet by mouth daily. 11/03/19   [provider]  gabapentin (NEURONTIN) 400 MG capsule TAKE 3 CAPSULES (1200 MG TOTAL) BY MOUTH 3 TIMES DAILY. 10/26/19   [provider]  HYDROcodone-acetaminophen (NORCO/VICODIN) 5-325 MG tablet Take 1 tablet by mouth every 6 (six) hours as needed for moderate pain.    [provider]  HYDROcodone-acetaminophen (NORCO/VICODIN) 5-325 MG tablet Take 1 tablet by mouth every 6 (six) hours as needed. 09/07/22   Mannie Stabile, PA-C  hydrOXYzine (ATARAX/VISTARIL) 25 MG tablet Take 2 tablets (50 mg total) by mouth every 6 (six) hours as needed for anxiety. 02/22/17   Alvira Monday, MD  ipratropium-albuterol (DUONEB) 0.5-2.5 (3) MG/3ML SOLN Take 3 mLs by nebulization 2 (two) times daily. Patient taking differently: Take 3 mLs by nebulization 2 (two) times daily as needed (shortness of breath/wheezing).  02/02/17   Kathlen Mody, MD  loratadine (CLARITIN) 10 MG tablet Take 10 mg by mouth daily. 09/01/18   [provider]  losartan (COZAAR) 25 MG  tablet Take 1 tablet (25 mg total) by mouth daily. 04/26/16   Calvert Cantor, MD  methocarbamol (ROBAXIN) 500 MG tablet Take 1 tablet (500 mg total) by mouth 2 (two) times daily. 09/07/22   Mannie Stabile, PA-C  metoprolol tartrate (LOPRESSOR) 25 MG tablet Take 1 tablet (25 mg total) by mouth 2 (two) times daily. 04/26/16   Calvert Cantor, MD  metoprolol tartrate (LOPRESSOR) 25 MG tablet Take 1 tablet by mouth 2 (two) times daily. 10/23/19   [provider]  montelukast (SINGULAIR) 10 MG tablet Take 1 tablet  (10 mg total) by mouth daily. 08/26/17 11/26/18  Delano Metz, MD  montelukast (SINGULAIR) 10 MG tablet Take 1 tablet by mouth daily. 12/07/19   [provider]  naproxen (NAPROSYN) 500 MG tablet Take 1 tablet (500 mg total) by mouth 2 (two) times daily. 09/07/22   Mannie Stabile, PA-C  polyethylene glycol (MIRALAX / GLYCOLAX) packet Take 17 g by mouth daily as needed for mild constipation. 04/26/16   Calvert Cantor, MD  potassium chloride SA (KLOR-CON M) 20 MEQ tablet Take 1 tablet (20 mEq total) by mouth 2 (two) times daily for 3 days. 06/29/22 07/02/22  Curatolo, Adam, DO  predniSONE (STERAPRED UNI-PAK 21 TAB) 10 MG (21) TBPK tablet Take by mouth daily. Take 6 tabs by mouth daily  for 2 days, then 5 tabs for 2 days, then 4 tabs for 2 days, then 3 tabs for 2 days, 2 tabs for 2 days, then 1 tab by mouth daily for 2 days 12/28/19   Caccavale, Sophia, PA-C  pregabalin (LYRICA) 200 MG capsule Take 200 mg by mouth 3 (three) times daily. 11/11/18   [provider]  pregabalin (LYRICA) 50 MG capsule Take by mouth. 10/26/19   [provider]  rOPINIRole (REQUIP) 0.5 MG tablet Take 0.5 mg by mouth at bedtime. 11/11/18   [provider]  topiramate (TOPAMAX) 50 MG tablet Take by mouth. 10/26/19 01/24/20  [provider]      Allergies    Latex    Review of Systems   Review of Systems  Physical Exam Updated Vital Signs BP 126/71   Pulse 94   Temp 98.5 F (36.9 C) (Oral)   Resp (!) 23   Ht 5\' 6"  (1.676 m)   Wt 116.8 kg   LMP 03/11/2021   SpO2 100%   BMI 41.58 kg/m  Physical Exam Vitals and nursing note reviewed.  Constitutional:      General: She is not in acute distress.    Appearance: She is well-developed. She is not diaphoretic.  HENT:     Head: Normocephalic and atraumatic.  Eyes:     Pupils: Pupils are equal, round, and reactive to light.  Cardiovascular:     Rate and Rhythm: Normal rate and regular rhythm.     Heart sounds: No murmur  heard.    No friction rub. No gallop.  Pulmonary:     Effort: Pulmonary effort is normal.     Breath sounds: No wheezing or rales.  Abdominal:     General: There is no distension.     Palpations: Abdomen is soft.     Tenderness: There is no abdominal tenderness.  Musculoskeletal:        General: No tenderness.     Cervical back: Normal range of motion and neck supple.  Skin:    General: Skin is warm and dry.  Neurological:     Mental Status: She is alert and oriented  to person, place, and time.  Psychiatric:        Behavior: Behavior normal.     ED Results / Procedures / Treatments   Labs (all labs ordered are listed, but only abnormal results are displayed) Labs Reviewed  POTASSIUM - Abnormal; Notable for the following components:      Result Value   Potassium 2.6 (*)    All other components within normal limits  MAGNESIUM - Abnormal; Notable for the following components:   Magnesium 1.6 (*)    All other components within normal limits    EKG EKG Interpretation Date/Time:  Tuesday January 01 2023 05:45:11 EST Ventricular Rate:  94 PR Interval:  134 QRS Duration:  99 QT Interval:  401 QTC Calculation: 502 R Axis:   37  Text Interpretation: Sinus rhythm Low voltage, precordial leads Borderline prolonged QT interval Otherwise no significant change Confirmed by Melene Plan [phone]) on 01/01/2023 5:46:23 AM  Radiology No results found.  Procedures .Critical Care  Performed by: Melene Plan, DO Authorized by: Melene Plan, DO   Critical care provider statement:    Critical care time (minutes):  35   Critical care time was exclusive of:  Separately billable procedures and treating other patients   Critical care was time spent personally by me on the following activities:  Development of treatment plan with patient or surrogate, discussions with consultants, evaluation of patient's response to treatment, examination of patient, ordering and review of laboratory studies,  ordering and review of radiographic studies, ordering and performing treatments and interventions, pulse oximetry, re-evaluation of patient's condition and review of old charts   Care discussed with: admitting provider       Medications Ordered in ED Medications  potassium chloride 10 mEq in 100 mL IVPB (has no administration in time range)  potassium chloride SA (KLOR-CON M) CR tablet 40 mEq (40 mEq Oral Given 01/01/23 0542)  magnesium oxide (MAG-OX) tablet 800 mg (800 mg Oral Given 01/01/23 0542)  potassium chloride 10 mEq in 100 mL IVPB (10 mEq Intravenous New Bag/Given 01/01/23 0555)  potassium chloride SA (KLOR-CON M) CR tablet 40 mEq (40 mEq Oral Given 01/01/23 6045)    ED Course/ Medical Decision Making/ A&P                                 Medical Decision Making Amount and/or Complexity of Data Reviewed Labs: ordered. ECG/medicine tests: ordered.  Risk OTC drugs. Prescription drug management.   57 yo F with a chief complaint of hypokalemia.  This was noted in her doctor's office.  On my record review the patient had a potassium of 2.9.  Will give an IV dose of potassium we will have it rechecked here.  Also check a mag level.  EKG.  K here 2.6, will replenish.    6:58 AM:  I have discussed the diagnosis/risks/treatment options with the patient and family.  Evaluation and diagnostic testing in the emergency department does not suggest an emergent condition requiring admission or immediate intervention beyond what has been performed at this time.  They will follow up with PCP. We also discussed returning to the ED immediately if new or worsening sx occur. We discussed the sx which are most concerning (e.g., sudden worsening pain, fever, inability to tolerate by mouth) that necessitate immediate return. Medications administered to the patient during their visit and any new prescriptions provided to the patient are listed below.  Medications  given during this visit Medications   potassium chloride 10 mEq in 100 mL IVPB (has no administration in time range)  potassium chloride SA (KLOR-CON M) CR tablet 40 mEq (40 mEq Oral Given 01/01/23 0542)  magnesium oxide (MAG-OX) tablet 800 mg (800 mg Oral Given 01/01/23 0542)  potassium chloride 10 mEq in 100 mL IVPB (10 mEq Intravenous New Bag/Given 01/01/23 0555)  potassium chloride SA (KLOR-CON M) CR tablet 40 mEq (40 mEq Oral Given 01/01/23 6045)     The patient appears reasonably screen and/or stabilized for discharge and I doubt any other medical condition or other [facility] requiring further screening, evaluation, or treatment in the ED at this time prior to discharge.          Final Clinical Impression(s) / ED Diagnoses Final diagnoses:  Hypokalemia    Rx / DC Orders ED Discharge Orders     None         Melene Plan, DO 01/01/23 [phone]

## 2023-01-01 NOTE — Discharge Instructions (Addendum)
Try to eat a food high in potassium with each meal for at least the next week.  Follow up with your family doc in the office for recheck.  I would hold your Lasix for 1 day as well.

## 2023-01-01 NOTE — ED Triage Notes (Signed)
Pt states has bilateral hand numbness x couple months. States seen yesterday and had labs drawn,  was called by PCP and told to got to ED for low K.

## 2023-01-01 NOTE — ED Provider Notes (Signed)
Patient had repeat potassium done after her second potassium bag it was already up to 3.2.  She completed her third potassium bag and was discharged home with potassium and magnesium prescription.  Recommend that she hold her Lasix for today and follow-up with her primary care doctor for recheck of her labs.  Discharged in good condition.  This chart was dictated using voice recognition software.  Despite best efforts to proofread,  errors can occur which can change the documentation meaning.    Virgina Norfolk, DO 01/01/23 [phone]

## 2023-03-06 ENCOUNTER — Encounter (HOSPITAL_BASED_OUTPATIENT_CLINIC_OR_DEPARTMENT_OTHER): Payer: Self-pay

## 2023-03-06 ENCOUNTER — Other Ambulatory Visit: Payer: Self-pay

## 2023-03-06 ENCOUNTER — Emergency Department (HOSPITAL_BASED_OUTPATIENT_CLINIC_OR_DEPARTMENT_OTHER): Payer: 59

## 2023-03-06 ENCOUNTER — Emergency Department (HOSPITAL_BASED_OUTPATIENT_CLINIC_OR_DEPARTMENT_OTHER)
Admission: EM | Admit: 2023-03-06 | Discharge: 2023-03-06 | Disposition: A | Discharge status: Home / Self Care | Payer: 59 | Attending: Emergency Medicine | Admitting: Emergency Medicine

## 2023-03-06 DIAGNOSIS — Z7951 Long term (current) use of inhaled steroids: Secondary | ICD-10-CM | POA: Insufficient documentation

## 2023-03-06 DIAGNOSIS — I110 Hypertensive heart disease with heart failure: Secondary | ICD-10-CM | POA: Diagnosis not present

## 2023-03-06 DIAGNOSIS — Z7982 Long term (current) use of aspirin: Secondary | ICD-10-CM | POA: Insufficient documentation

## 2023-03-06 DIAGNOSIS — R000 Tachycardia, unspecified: Secondary | ICD-10-CM | POA: Insufficient documentation

## 2023-03-06 DIAGNOSIS — E876 Hypokalemia: Secondary | ICD-10-CM | POA: Diagnosis not present

## 2023-03-06 DIAGNOSIS — J45909 Unspecified asthma, uncomplicated: Secondary | ICD-10-CM | POA: Diagnosis not present

## 2023-03-06 DIAGNOSIS — Z79899 Other long term (current) drug therapy: Secondary | ICD-10-CM | POA: Diagnosis not present

## 2023-03-06 DIAGNOSIS — Z91040 Latex allergy status: Secondary | ICD-10-CM | POA: Insufficient documentation

## 2023-03-06 DIAGNOSIS — R0789 Other chest pain: Secondary | ICD-10-CM | POA: Insufficient documentation

## 2023-03-06 DIAGNOSIS — I509 Heart failure, unspecified: Secondary | ICD-10-CM | POA: Insufficient documentation

## 2023-03-06 LAB — CBC
HCT: 41 % (ref 36.0–46.0)
Hemoglobin: 12.8 g/dL (ref 12.0–15.0)
MCH: 27.9 pg (ref 26.0–34.0)
MCHC: 31.2 g/dL (ref 30.0–36.0)
MCV: 89.3 fL (ref 80.0–100.0)
Platelets: 229 10*3/uL (ref 150–400)
RBC: 4.59 MIL/uL (ref 3.87–5.11)
RDW: 14.1 % (ref 11.5–15.5)
WBC: 5 10*3/uL (ref 4.0–10.5)
nRBC: 0 % (ref 0.0–0.2)

## 2023-03-06 LAB — BASIC METABOLIC PANEL
Anion gap: 9 (ref 5–15)
BUN: 8 mg/dL (ref 6–20)
CO2: 26 mmol/L (ref 22–32)
Calcium: 9.2 mg/dL (ref 8.9–10.3)
Chloride: 104 mmol/L (ref 98–111)
Creatinine, Ser: 0.77 mg/dL (ref 0.44–1.00)
GFR, Estimated: >60 mL/min (ref >60)
Glucose, Bld: 122 mg/dL — ABNORMAL HIGH (ref 70–99)
Potassium: 3.1 mmol/L — ABNORMAL LOW (ref 3.5–5.1)
Sodium: 139 mmol/L (ref 135–145)

## 2023-03-06 LAB — TROPONIN I (HIGH SENSITIVITY)
Troponin I (High Sensitivity): 2 ng/L (ref <18)
Troponin I (High Sensitivity): <2 ng/L (ref <18)

## 2023-03-06 MED ORDER — ASPIRIN 81 MG PO CHEW
324.0000 mg | CHEWABLE_TABLET | Freq: Once | ORAL | Status: AC
  Administered 2023-03-06: 324 mg via ORAL
  Filled 2023-03-06: qty 4

## 2023-03-06 MED ORDER — POTASSIUM CHLORIDE CRYS ER 20 MEQ PO TBCR
40.0000 meq | EXTENDED_RELEASE_TABLET | Freq: Once | ORAL | Status: AC
  Administered 2023-03-06: 40 meq via ORAL
  Filled 2023-03-06: qty 2

## 2023-03-06 MED ORDER — MORPHINE SULFATE (PF) 4 MG/ML IV SOLN
4.0000 mg | Freq: Once | INTRAVENOUS | Status: AC
  Administered 2023-03-06: 4 mg via INTRAVENOUS
  Filled 2023-03-06: qty 1

## 2023-03-06 NOTE — ED Provider Notes (Signed)
 [facility] Provider Note   CSN: [id number] Arrival date & time: 03/06/23  9061     History  Chief Complaint  Patient presents with   Chest Pain    Melinda Woods is a 58 y.o. female.  With a history of hypertension, CHF with an EF of 55 to 60% on 11/26/2018, anxiety, neuropathy, fibromyalgia, asthma presenting to the ED for evaluation of chest pain.  Symptoms began yesterday evening though she does not know what time.  Localized to the left upper chest.  Pain does not radiate.  Described as a sharp and stabbing pain.  Worse with left shoulder movement, improved with rest.  She has not taken anything for the pain today prior to arrival.  She denies any history of CAD.  No significant family cardiac history.  No nausea, vomiting or diaphoresis.  No cough or shortness of breath.  No unilateral leg swelling, history of DVT or PE, recent cancer treatments, surgeries, long distance travel, hemoptysis.  No exogenous estrogen use.  She suspects her symptoms are due to her low potassium of 3.5 recently.   Chest Pain      Home Medications Prior to Admission medications   Medication Sig Start Date End Date Taking? Authorizing Provider  acetaminophen  (TYLENOL ) 650 MG CR tablet Take 1,300 mg by mouth daily as needed for pain.    [provider]  albuterol  (ACCUNEB ) 0.63 MG/3ML nebulizer solution Inhale into the lungs. 03/24/19   [provider]  albuterol  (VENTOLIN  HFA) 108 (90 Base) MCG/ACT inhaler Inhale 2 puffs into the lungs every 4 (four) hours as needed for wheezing or shortness of breath. 10/19/18   Charlyn Sora, MD  amitriptyline  (ELAVIL ) 100 MG tablet Take 1 tablet (100 mg total) by mouth 2 (two) times daily. 04/26/16   Rizwan, Saima, MD  amoxicillin -clavulanate (AUGMENTIN ) 875-125 MG tablet Take 1 tablet by mouth every 12 (twelve) hours. 07/21/22   Hildegard Loge, PA-C  aspirin  EC 81 MG tablet Take 1 tablet (81 mg total) by mouth  daily. 08/26/17   Geralynn Charleston, MD  atorvastatin  (LIPITOR) 40 MG tablet Take 1 tablet by mouth daily. 03/02/19   [provider]  baclofen (LIORESAL) 10 MG tablet Take 1 tablet by mouth at bedtime as needed. 10/26/19   [provider]  budesonide-formoterol  (SYMBICORT) 160-4.5 MCG/ACT inhaler Inhale into the lungs. 11/30/19   [provider]  celecoxib  (CELEBREX ) 100 MG capsule Take 1 capsule by mouth 2 (two) times daily. 12/10/19   [provider]  DULoxetine (CYMBALTA) 60 MG capsule Take 60 mg by mouth daily. 08/16/17   [provider]  furosemide  (LASIX ) 40 MG tablet Take 1 tablet (40 mg total) by mouth daily. 08/27/17   Geralynn Charleston, MD  furosemide  (LASIX ) 40 MG tablet Take 1 tablet by mouth daily. 11/03/19   [provider]  gabapentin  (NEURONTIN ) 400 MG capsule TAKE 3 CAPSULES (1200 MG TOTAL) BY MOUTH 3 TIMES DAILY. 10/26/19   [provider]  HYDROcodone -acetaminophen  (NORCO/VICODIN) 5-325 MG tablet Take 1 tablet by mouth every 6 (six) hours as needed for moderate pain.    [provider]  HYDROcodone -acetaminophen  (NORCO/VICODIN) 5-325 MG tablet Take 1 tablet by mouth every 6 (six) hours as needed. 09/07/22   Aberman, Caroline C, PA-C  hydrOXYzine  (ATARAX /VISTARIL ) 25 MG tablet Take 2 tablets (50 mg total) by mouth every 6 (six) hours as needed for anxiety. 02/22/17   Dreama Longs, MD  ipratropium-albuterol  (DUONEB) 0.5-2.5 (3) MG/3ML SOLN  Take 3 mLs by nebulization 2 (two) times daily. Patient taking differently: Take 3 mLs by nebulization 2 (two) times daily as needed (shortness of breath/wheezing).  02/02/17   Akula, Vijaya, MD  loratadine (CLARITIN) 10 MG tablet Take 10 mg by mouth daily. 09/01/18   [provider]  losartan  (COZAAR ) 25 MG tablet Take 1 tablet (25 mg total) by mouth daily. 04/26/16   Rizwan, Saima, MD  methocarbamol  (ROBAXIN ) 500 MG tablet Take 1 tablet (500 mg total) by mouth 2 (two) times daily.  09/07/22   Aberman, Caroline C, PA-C  metoprolol  tartrate (LOPRESSOR ) 25 MG tablet Take 1 tablet (25 mg total) by mouth 2 (two) times daily. 04/26/16   Rizwan, Saima, MD  metoprolol  tartrate (LOPRESSOR ) 25 MG tablet Take 1 tablet by mouth 2 (two) times daily. 10/23/19   [provider]  montelukast  (SINGULAIR ) 10 MG tablet Take 1 tablet (10 mg total) by mouth daily. 08/26/17 11/26/18  Geralynn Charleston, MD  montelukast  (SINGULAIR ) 10 MG tablet Take 1 tablet by mouth daily. 12/07/19   [provider]  naproxen  (NAPROSYN ) 500 MG tablet Take 1 tablet (500 mg total) by mouth 2 (two) times daily. 09/07/22   Aberman, Caroline C, PA-C  polyethylene glycol (MIRALAX  / GLYCOLAX ) packet Take 17 g by mouth daily as needed for mild constipation. 04/26/16   Rizwan, Saima, MD  potassium chloride  SA (KLOR-CON  M) 20 MEQ tablet Take 1 tablet (20 mEq total) by mouth 2 (two) times daily for 5 days. 01/01/23 01/06/23  Curatolo, Adam, DO  predniSONE  (STERAPRED UNI-PAK 21 TAB) 10 MG (21) TBPK tablet Take by mouth daily. Take 6 tabs by mouth daily  for 2 days, then 5 tabs for 2 days, then 4 tabs for 2 days, then 3 tabs for 2 days, 2 tabs for 2 days, then 1 tab by mouth daily for 2 days 12/28/19   Caccavale, Sophia, PA-C  pregabalin (LYRICA) 200 MG capsule Take 200 mg by mouth 3 (three) times daily. 11/11/18   [provider]  pregabalin (LYRICA) 50 MG capsule Take by mouth. 10/26/19   [provider]  rOPINIRole (REQUIP) 0.5 MG tablet Take 0.5 mg by mouth at bedtime. 11/11/18   [provider]  topiramate (TOPAMAX) 50 MG tablet Take by mouth. 10/26/19 01/24/20  [provider]      Allergies    Latex    Review of Systems   Review of Systems  Cardiovascular:  Positive for chest pain.  All other systems reviewed and are negative.   Physical Exam Updated Vital Signs BP 113/74   Pulse (!) 103   Temp 98.4 F (36.9 C) (Oral)   Resp 19   LMP 03/11/2021   SpO2 95%  Physical  Exam Vitals and nursing note reviewed.  Constitutional:      Appearance: Normal appearance. She is well-developed and normal weight. She is not ill-appearing.     Comments: Resting in bed, appears uncomfortable  HENT:     Head: Normocephalic and atraumatic.  Eyes:     Conjunctiva/sclera: Conjunctivae normal.  Cardiovascular:     Rate and Rhythm: Normal rate and regular rhythm.     Heart sounds: Normal heart sounds. No murmur heard. Pulmonary:     Effort: Pulmonary effort is normal. No respiratory distress.     Breath sounds: Examination of the right-upper field reveals wheezing. Examination of the left-upper field reveals wheezing. Wheezing present. No decreased breath sounds, rhonchi or rales.  Chest:     Comments: TTP  to left parasternal region and the left anterior deltoid into pectoral region.  No overlying rashes. Abdominal:     General: Abdomen is flat.     Palpations: Abdomen is soft.     Tenderness: There is no abdominal tenderness.  Musculoskeletal:        General: No swelling. Normal range of motion.     Cervical back: Neck supple.     Right lower leg: No edema.     Left lower leg: No edema.  Skin:    General: Skin is warm and dry.     Capillary Refill: Capillary refill takes less than 2 seconds.  Neurological:     Mental Status: She is alert and oriented to person, place, and time.  Psychiatric:        Mood and Affect: Mood normal.        Behavior: Behavior normal.     ED Results / Procedures / Treatments   Labs (all labs ordered are listed, but only abnormal results are displayed) Labs Reviewed  BASIC METABOLIC PANEL - Abnormal; Notable for the following components:      Result Value   Potassium 3.1 (*)    Glucose, Bld 122 (*)    All other components within normal limits  CBC  TROPONIN I (HIGH SENSITIVITY)  TROPONIN I (HIGH SENSITIVITY)    EKG EKG Interpretation Date/Time:  Wednesday March 06 2023 09:53:04 EST Ventricular Rate:  100 PR  Interval:  127 QRS Duration:  90 QT Interval:  362 QTC Calculation: 467 R Axis:   50  Text Interpretation: Sinus tachycardia Low voltage, precordial leads Since last tracing rate faster Confirmed by Dean Clarity [phone]) on 03/06/2023 10:02:04 AM  Radiology DG Chest Port 1 View Result Date: 03/06/2023 CLINICAL DATA:  Left-sided chest pain.  Left arm pain. EXAM: PORTABLE CHEST 1 VIEW COMPARISON:  Two-view chest x-ray 10/23/2021 FINDINGS: The heart size is normal. Atherosclerotic calcifications present at the aortic arch. Mild pulmonary vascular congestion is present. No definite effusions are present. No airspace consolidation is present. IMPRESSION: Mild pulmonary vascular congestion without frank edema. Electronically Signed   By: Lonni Necessary M.D.   On: 03/06/2023 10:29    Procedures Procedures    Medications Ordered in ED Medications  aspirin  chewable tablet 324 mg (324 mg Oral Given 03/06/23 1000)  morphine  (PF) 4 MG/ML injection 4 mg (4 mg Intravenous Given 03/06/23 1001)  potassium chloride  SA (KLOR-CON  M) CR tablet 40 mEq (40 mEq Oral Given 03/06/23 1052)    ED Course/ Medical Decision Making/ A&P             HEART Score: 2                    Medical Decision Making Amount and/or Complexity of Data Reviewed Labs: ordered. Radiology: ordered.  Risk OTC drugs. Prescription drug management.  This patient presents to the ED for concern of chest pain, this involves an extensive number of treatment options, and is a complaint that carries with it a high risk of complications and morbidity. The emergent differential diagnosis of chest pain includes: Acute coronary syndrome, pericarditis, aortic dissection, pulmonary embolism, tension pneumothorax, and esophageal rupture.  I do not believe the patient has an emergent cause of chest pain, other urgent/non-acute considerations include, but are not limited to: chronic angina, aortic stenosis, cardiomyopathy, myocarditis, mitral  valve prolapse, pulmonary hypertension, hypertrophic obstructive cardiomyopathy (HOCM), aortic insufficiency, right ventricular hypertrophy, pneumonia, pleuritis, bronchitis, pneumothorax, tumor, gastroesophageal reflux disease (GERD), esophageal  spasm, Mallory-Weiss syndrome, peptic ulcer disease, biliary disease, pancreatitis, functional gastrointestinal pain, cervical or thoracic disk disease or arthritis, shoulder arthritis, costochondritis, subacromial bursitis, anxiety or panic attack, herpes zoster, breast disorders, chest wall tumors, thoracic outlet syndrome, mediastinitis.  My initial workup includes labs, imaging, EKG, aspirin , pain control  Additional history obtained from: Nursing notes from this visit.  I ordered, reviewed and interpreted labs which include: CBC, BMP, troponin.  Hypokalemia of 3.1.  Labs otherwise within normal limits  I ordered imaging studies including chest x-ray I independently visualized and interpreted imaging which showed negative I agree with the radiologist interpretation  Cardiac Monitoring:  The patient was maintained on a cardiac monitor.  I personally viewed and interpreted the cardiac monitored which showed an underlying rhythm of: NSR  Afebrile, hemodynamically stable.  58 year old female presenting to the ED for evaluation of left upper chest pain.  This began yesterday evening.  Initially was unaware of any trauma to the area, however on repeat examination she states that she has a puppy that she has states she is around a lot and she may have reached and stretch with her left arm towards the dog just before symptom onset yesterday.  Overall suspect musculoskeletal pain.  Patient is tender to the area.  Pain is also reproducible with left shoulder movement.  EKG without acute ischemic changes.  Initial delta troponin negative.  Chest x-ray with mild congestion without edema.  Low suspicion for ACS or cardiopulmonary abnormalities of the cause of her  symptoms.  Symptoms improved in the ED.  She was encouraged to follow-up with her primary care provider in 1 week for reevaluation.  She was given return precautions.  Stable at discharge.  At this time there does not appear to be any evidence of an acute emergency medical condition and the patient appears stable for discharge with appropriate outpatient follow up. Diagnosis was discussed with patient who verbalizes understanding of care plan and is agreeable to discharge. I have discussed return precautions with patient who verbalizes understanding. Patient encouraged to follow-up with their PCP within 1 week. All questions answered.  Note: Portions of this report may have been transcribed using voice recognition software. Every effort was made to ensure accuracy; however, inadvertent computerized transcription errors may still be present.         Final Clinical Impression(s) / ED Diagnoses Final diagnoses:  Chest wall pain    Rx / DC Orders ED Discharge Orders     None         Edwardo Marsa CHRISTELLA DEVONNA 03/06/23 1350    Dean Clarity, MD 03/06/23 1352

## 2023-03-06 NOTE — Discharge Instructions (Addendum)
 You have been seen today for your complaint of chest pain.  I suspect this is musculoskeletal in nature. Your lab work was reassuring. Your imaging was reassuring. Your discharge medications include Alternate tylenol  and ibuprofen  for pain. You may alternate these every 4 hours. You may take up to 800 mg of ibuprofen  at a time and up to 1000 mg of tylenol . Follow up with: Your primary care provider Please seek immediate medical care if you develop any of the following symptoms: Your chest pain gets worse. You have a cough that gets worse, or you cough up blood. You have severe pain in your abdomen. You faint. You have sudden, unexplained chest discomfort. You have sudden, unexplained discomfort in your arms, back, neck, or jaw. You have shortness of breath at any time. You suddenly start to sweat, or your skin gets clammy. You feel nausea or you vomit. You suddenly feel lightheaded or dizzy. You have severe weakness, or unexplained weakness or fatigue. Your heart begins to beat quickly, or it feels like it is skipping beats. At this time there does not appear to be the presence of an emergent medical condition, however there is always the potential for conditions to change. Please read and follow the below instructions.  Do not take your medicine if  develop an itchy rash, swelling in your mouth or lips, or difficulty breathing; call 911 and seek immediate emergency medical attention if this occurs.  You may review your lab tests and imaging results in their entirety on your MyChart account.  Please discuss all results of fully with your primary care provider and other specialist at your follow-up visit.  Note: Portions of this text may have been transcribed using voice recognition software. Every effort was made to ensure accuracy; however, inadvertent computerized transcription errors may still be present.

## 2023-03-06 NOTE — ED Triage Notes (Signed)
 Pt states she began to have left side chest pain on Tuesday. Pt is also having left arm pain. Hurts to lift her left arm.

## 2023-05-06 ENCOUNTER — Encounter (HOSPITAL_BASED_OUTPATIENT_CLINIC_OR_DEPARTMENT_OTHER): Payer: Self-pay | Admitting: Emergency Medicine

## 2023-05-06 ENCOUNTER — Emergency Department (HOSPITAL_BASED_OUTPATIENT_CLINIC_OR_DEPARTMENT_OTHER)
Admission: EM | Admit: 2023-05-06 | Discharge: 2023-05-06 | Disposition: A | Discharge status: Home / Self Care | Payer: MEDICAID | Attending: Emergency Medicine | Admitting: Emergency Medicine

## 2023-05-06 ENCOUNTER — Other Ambulatory Visit: Payer: Self-pay

## 2023-05-06 ENCOUNTER — Emergency Department (HOSPITAL_BASED_OUTPATIENT_CLINIC_OR_DEPARTMENT_OTHER): Payer: MEDICAID

## 2023-05-06 DIAGNOSIS — E876 Hypokalemia: Secondary | ICD-10-CM | POA: Diagnosis not present

## 2023-05-06 DIAGNOSIS — I509 Heart failure, unspecified: Secondary | ICD-10-CM | POA: Insufficient documentation

## 2023-05-06 DIAGNOSIS — Z79899 Other long term (current) drug therapy: Secondary | ICD-10-CM | POA: Insufficient documentation

## 2023-05-06 DIAGNOSIS — Z91040 Latex allergy status: Secondary | ICD-10-CM | POA: Diagnosis not present

## 2023-05-06 DIAGNOSIS — R0789 Other chest pain: Secondary | ICD-10-CM | POA: Insufficient documentation

## 2023-05-06 LAB — BASIC METABOLIC PANEL WITH GFR
Anion gap: 10 (ref 5–15)
BUN: 14 mg/dL (ref 6–20)
CO2: 29 mmol/L (ref 22–32)
Calcium: 9.5 mg/dL (ref 8.9–10.3)
Chloride: 98 mmol/L (ref 98–111)
Creatinine, Ser: 0.84 mg/dL (ref 0.44–1.00)
GFR, Estimated: >60 mL/min
Glucose, Bld: 110 mg/dL — ABNORMAL HIGH (ref 70–99)
Potassium: 3.3 mmol/L — ABNORMAL LOW (ref 3.5–5.1)
Sodium: 137 mmol/L (ref 135–145)

## 2023-05-06 LAB — CBC
HCT: 40.6 % (ref 36.0–46.0)
Hemoglobin: 13.3 g/dL (ref 12.0–15.0)
MCH: 28.5 pg (ref 26.0–34.0)
MCHC: 32.8 g/dL (ref 30.0–36.0)
MCV: 86.9 fL (ref 80.0–100.0)
Platelets: 319 10*3/uL (ref 150–400)
RBC: 4.67 MIL/uL (ref 3.87–5.11)
RDW: 14.8 % (ref 11.5–15.5)
WBC: 6.5 10*3/uL (ref 4.0–10.5)
nRBC: 0 % (ref 0.0–0.2)

## 2023-05-06 LAB — CBG MONITORING, ED: Glucose-Capillary: 119 mg/dL — ABNORMAL HIGH (ref 70–99)

## 2023-05-06 LAB — BRAIN NATRIURETIC PEPTIDE: B Natriuretic Peptide: 18 pg/mL (ref 0.0–100.0)

## 2023-05-06 LAB — TROPONIN I (HIGH SENSITIVITY): Troponin I (High Sensitivity): <2 ng/L

## 2023-05-06 MED ORDER — POTASSIUM CHLORIDE CRYS ER 20 MEQ PO TBCR
40.0000 meq | EXTENDED_RELEASE_TABLET | Freq: Once | ORAL | Status: AC
  Administered 2023-05-06: 40 meq via ORAL
  Filled 2023-05-06: qty 2

## 2023-05-06 MED ORDER — MORPHINE SULFATE (PF) 4 MG/ML IV SOLN
4.0000 mg | Freq: Once | INTRAVENOUS | Status: AC
  Administered 2023-05-06: 4 mg via INTRAVENOUS
  Filled 2023-05-06: qty 1

## 2023-05-06 NOTE — ED Notes (Signed)
 D/c paperwork reviewed with pt, including follow up care.  All questions and/or concerns addressed at time of d/c.  No further needs expressed. . Pt verbalized understanding, Ambulatory with significant other to ED exit, NAD.

## 2023-05-06 NOTE — ED Notes (Signed)
 Pt called out requesting to leave.  EDP made aware.

## 2023-05-06 NOTE — ED Provider Notes (Signed)
 [place] [facility] AT [facility] Provider Note   CSN: [id number] Arrival date & time: 05/06/23  1511     History Chief Complaint  Patient presents with   Chest Pain   Dizziness    Melinda Woods is a 58 y.o. female.  Patient past history significant for CHF, morbid obesity, respiratory failure presents ED with concerns of chest pain and intermittent dizziness for last 2 weeks.  States that she is also having some worsening left lower back pain.  Because of this pain is similar to her past episodes of sciatica.  Also endorsing some left chest pain.  Denies any significant alleviating or aggravating factors.  Regarding dizziness, she states that she feels that she is spinning.  Denies any visual changes.   Chest Pain Associated symptoms: dizziness   Dizziness Associated symptoms: chest pain        Home Medications Prior to Admission medications   Medication Sig Start Date End Date Taking? Authorizing Provider  acetaminophen (TYLENOL) 650 MG CR tablet Take 1,300 mg by mouth daily as needed for pain.    [provider]  albuterol (ACCUNEB) 0.63 MG/3ML nebulizer solution Inhale into the lungs. 03/24/19   [provider]  albuterol (VENTOLIN HFA) 108 (90 Base) MCG/ACT inhaler Inhale 2 puffs into the lungs every 4 (four) hours as needed for wheezing or shortness of breath. 10/19/18   Derwood Kaplan, MD  amitriptyline (ELAVIL) 100 MG tablet Take 1 tablet (100 mg total) by mouth 2 (two) times daily. 04/26/16   Calvert Cantor, MD  amoxicillin-clavulanate (AUGMENTIN) 875-125 MG tablet Take 1 tablet by mouth every 12 (twelve) hours. 07/21/22   Marita Kansas, PA-C  aspirin EC 81 MG tablet Take 1 tablet (81 mg total) by mouth daily. 08/26/17   Delano Metz, MD  atorvastatin (LIPITOR) 40 MG tablet Take 1 tablet by mouth daily. 03/02/19   [provider]  baclofen (LIORESAL) 10 MG tablet Take 1 tablet by mouth at bedtime as needed. 10/26/19   [provider]  budesonide-formoterol (SYMBICORT) 160-4.5 MCG/ACT inhaler Inhale into the lungs. 11/30/19   [provider]  celecoxib (CELEBREX) 100 MG capsule Take 1 capsule by mouth 2 (two) times daily. 12/10/19   [provider]  DULoxetine (CYMBALTA) 60 MG capsule Take 60 mg by mouth daily. 08/16/17   [provider]  furosemide (LASIX) 40 MG tablet Take 1 tablet (40 mg total) by mouth daily. 08/27/17   Delano Metz, MD  furosemide (LASIX) 40 MG tablet Take 1 tablet by mouth daily. 11/03/19   [provider]  gabapentin (NEURONTIN) 400 MG capsule TAKE 3 CAPSULES (1200 MG TOTAL) BY MOUTH 3 TIMES DAILY. 10/26/19   [provider]  HYDROcodone-acetaminophen (NORCO/VICODIN) 5-325 MG tablet Take 1 tablet by mouth every 6 (six) hours as needed for moderate pain.    [provider]  HYDROcodone-acetaminophen (NORCO/VICODIN) 5-325 MG tablet Take 1 tablet by mouth every 6 (six) hours as needed. 09/07/22   Mannie Stabile, PA-C  hydrOXYzine (ATARAX/VISTARIL) 25 MG tablet Take 2 tablets (50 mg total) by mouth every 6 (six) hours as needed for anxiety. 02/22/17   Alvira Monday, MD  ipratropium-albuterol (DUONEB) 0.5-2.5 (3) MG/3ML SOLN Take 3 mLs by nebulization 2 (two) times daily. Patient taking differently: Take 3 mLs by nebulization 2 (two) times daily as needed (shortness of breath/wheezing).  02/02/17   Kathlen Mody, MD  loratadine (CLARITIN) 10 MG tablet Take 10 mg by mouth daily. 09/01/18   [provider]  losartan (COZAAR) 25 MG tablet Take 1 tablet (25 mg total) by mouth daily. 04/26/16   Calvert Cantor, MD  methocarbamol (ROBAXIN) 500 MG tablet Take 1 tablet (500 mg total) by mouth 2 (two) times daily. 09/07/22   Mannie Stabile, PA-C  metoprolol tartrate (LOPRESSOR) 25 MG tablet Take 1 tablet (25 mg total) by mouth 2 (two) times daily. 04/26/16   Calvert Cantor, MD  metoprolol tartrate (LOPRESSOR) 25 MG tablet Take 1 tablet by mouth 2  (two) times daily. 10/23/19   [provider]  montelukast (SINGULAIR) 10 MG tablet Take 1 tablet (10 mg total) by mouth daily. 08/26/17 11/26/18  Delano Metz, MD  montelukast (SINGULAIR) 10 MG tablet Take 1 tablet by mouth daily. 12/07/19   [provider]  naproxen (NAPROSYN) 500 MG tablet Take 1 tablet (500 mg total) by mouth 2 (two) times daily. 09/07/22   Mannie Stabile, PA-C  polyethylene glycol (MIRALAX / GLYCOLAX) packet Take 17 g by mouth daily as needed for mild constipation. 04/26/16   Calvert Cantor, MD  potassium chloride SA (KLOR-CON M) 20 MEQ tablet Take 1 tablet (20 mEq total) by mouth 2 (two) times daily for 5 days. 01/01/23 01/06/23  Curatolo, Adam, DO  predniSONE (STERAPRED UNI-PAK 21 TAB) 10 MG (21) TBPK tablet Take by mouth daily. Take 6 tabs by mouth daily  for 2 days, then 5 tabs for 2 days, then 4 tabs for 2 days, then 3 tabs for 2 days, 2 tabs for 2 days, then 1 tab by mouth daily for 2 days 12/28/19   Caccavale, Sophia, PA-C  pregabalin (LYRICA) 200 MG capsule Take 200 mg by mouth 3 (three) times daily. 11/11/18   [provider]  pregabalin (LYRICA) 50 MG capsule Take by mouth. 10/26/19   [provider]  rOPINIRole (REQUIP) 0.5 MG tablet Take 0.5 mg by mouth at bedtime. 11/11/18   [provider]  topiramate (TOPAMAX) 50 MG tablet Take by mouth. 10/26/19 01/24/20  [provider]      Allergies    Latex    Review of Systems   Review of Systems  Cardiovascular:  Positive for chest pain.  Neurological:  Positive for dizziness.  All other systems reviewed and are negative.   Physical Exam Updated Vital Signs BP 118/77   Pulse (!) 105   Temp 97.9 F (36.6 C) (Oral)   Resp (!) 26   Ht 5\' 6"  (1.676 m)   Wt 114.3 kg   LMP 03/11/2021   SpO2 96%   BMI 40.67 kg/m  Physical Exam Vitals and nursing note reviewed.  Constitutional:      General: She is not in acute distress.    Appearance: She is well-developed.   HENT:     Head: Normocephalic and atraumatic.  Eyes:     Conjunctiva/sclera: Conjunctivae normal.  Cardiovascular:     Rate and Rhythm: Normal rate and regular rhythm.     Heart sounds: No murmur heard. Pulmonary:     Effort: Pulmonary effort is normal. No respiratory distress.     Breath sounds: Normal breath sounds. No decreased breath sounds, wheezing, rhonchi or rales.  Abdominal:     Palpations: Abdomen is soft. There is no mass.     Tenderness: There is no abdominal tenderness. There is no guarding or rebound.  Musculoskeletal:        General: No swelling.     Cervical back: Neck supple.  Skin:    General: Skin  is warm and dry.     Capillary Refill: Capillary refill takes less than 2 seconds.  Neurological:     Mental Status: She is alert.  Psychiatric:        Mood and Affect: Mood normal.     ED Results / Procedures / Treatments   Labs (all labs ordered are listed, but only abnormal results are displayed) Labs Reviewed  BASIC METABOLIC PANEL - Abnormal; Notable for the following components:      Result Value   Potassium 3.3 (*)    Glucose, Bld 110 (*)    All other components within normal limits  CBG MONITORING, ED - Abnormal; Notable for the following components:   Glucose-Capillary 119 (*)    All other components within normal limits  CBC  BRAIN NATRIURETIC PEPTIDE  URINALYSIS, ROUTINE W REFLEX MICROSCOPIC  TROPONIN I (HIGH SENSITIVITY)    EKG None  Radiology DG Chest Portable 1 View Result Date: 05/06/2023 CLINICAL DATA:  Chest pain. EXAM: PORTABLE CHEST 1 VIEW COMPARISON:  03/06/2023 FINDINGS: The cardiomediastinal contours are normal. Pulmonary vasculature is normal. No consolidation, pleural effusion, or pneumothorax. No acute osseous abnormalities are seen. IMPRESSION: No active disease. Electronically Signed   By: Narda Rutherford M.D.   On: 05/06/2023 20:44    Procedures Procedures    Medications Ordered in ED Medications  potassium chloride  SA (KLOR-CON M) CR tablet 40 mEq (has no administration in time range)  morphine (PF) 4 MG/ML injection 4 mg (4 mg Intravenous Given 05/06/23 1756)    ED Course/ Medical Decision Making/ A&P                                 Medical Decision Making Amount and/or Complexity of Data Reviewed Labs: ordered. Radiology: ordered.  Risk Prescription drug management.   This patient presents to the ED for concern of chest pain, dizziness, back pain.  Differential diagnosis includes ACS, PE, GERD, pneumonia   Lab Tests:  I Ordered, and personally interpreted labs.  The pertinent results include: CBC unremarkable, troponin negative, BNP normal with exception of mild hypokalemia, BMP unremarkable, CBG normal   Imaging Studies ordered:  I ordered imaging studies including chest x-ray I independently visualized and interpreted imaging which showed no acute cardiopulmonary process I agree with the radiologist interpretation   Medicines ordered and prescription drug management:  I ordered medication including morphine, potassium chloride for pain, hypokalemia Reevaluation of the patient after these medicines showed that the patient improved I have reviewed the patients home medicines and have made adjustments as needed   Problem List / ED Course:  Patient with past history significant for CHF, morbid obesity, respiratory failure, rheumatoid arthritis presents ED with concerns of chest pain, dizziness, and low back pain.  Feels that she is having intermittent episodes of dizziness with some chest pain.  Unable to clearly localize area of pain but states that have been primarily present to the left side last 3 weeks.  Does notice a prior history of episodes of hypokalemia.  Denies any recent nausea, vomiting, or diarrhea. Physical exam is unremarkable.  No abnormal lung sounds.  Lungs are clear to auscultation bilaterally.  No notable lower extremity edema.  Will obtain labs and imaging for  cardiac rule out. Lab workup is reassuring.  Troponin is negative at less than 2.  BNP unremarkable at 18.  Some mild hypokalemia at 3.3.  CBC unremarkable.  Given reassuring findings,  doubt ACS or CHF.  Chest x-ray unremarkable. Given reassuring workup and lack of concerning symptoms such as feelings of shortness of breath, hemoptysis, recent surgery immobilization, doubtful of PE.  No acute signs to suggest pneumonia as patient is afebrile.  Unclear exact etiology of patient's current symptoms.  Advised patient to follow-up with PCP.  Return precautions discussed.  Patient otherwise stable and plans for outpatient follow-up.  Final Clinical Impression(s) / ED Diagnoses Final diagnoses:  Other chest pain  Hypokalemia    Rx / DC Orders ED Discharge Orders     None         Salomon Mast 05/06/23 2055    Tegeler, Canary Brim, MD 05/06/23 [phone]

## 2023-05-06 NOTE — ED Triage Notes (Signed)
 Pt POV in wheelchair- pt c/o intermittent dizziness x2 weeks, also c/o L lower back that feels similar to previous sciatica.  Also reports L chest pain x2 weeks.   Reports hx of hypokalemia.

## 2023-05-06 NOTE — Discharge Instructions (Signed)
 You are seen in the emergency department today for concerns of chest pain and dizziness.  Your labs and imaging were all thankfully reassuring without any specific abnormality seen.  Given your reassuring findings, I do not suspect that your symptoms are due to any form of heart damage at this time.  I would continue with your home medications.  Please follow-up with your primary care provider.  Return to the emergency department for any concerns of new or worsening symptoms.
